# Patient Record
Sex: Female | Born: 1935 | Race: White | Hispanic: No | Smoking: Never smoker
Health system: Southern US, Community
[De-identification: ages and names within clinical notes are randomized; demographics above are authoritative.]

## PROBLEM LIST (undated history)

## (undated) ENCOUNTER — Inpatient Hospital Stay: Admission: AD | Payer: Medicare Other | Source: Other Acute Inpatient Hospital | Admitting: Internal Medicine

## (undated) DIAGNOSIS — I5042 Chronic combined systolic (congestive) and diastolic (congestive) heart failure: Secondary | ICD-10-CM

## (undated) DIAGNOSIS — F329 Major depressive disorder, single episode, unspecified: Secondary | ICD-10-CM

## (undated) DIAGNOSIS — F32A Depression, unspecified: Secondary | ICD-10-CM

## (undated) DIAGNOSIS — E785 Hyperlipidemia, unspecified: Secondary | ICD-10-CM

## (undated) DIAGNOSIS — I471 Supraventricular tachycardia, unspecified: Secondary | ICD-10-CM

## (undated) DIAGNOSIS — N3281 Overactive bladder: Secondary | ICD-10-CM

## (undated) DIAGNOSIS — E119 Type 2 diabetes mellitus without complications: Secondary | ICD-10-CM

## (undated) DIAGNOSIS — I447 Left bundle-branch block, unspecified: Secondary | ICD-10-CM

## (undated) DIAGNOSIS — I639 Cerebral infarction, unspecified: Secondary | ICD-10-CM

## (undated) DIAGNOSIS — I2699 Other pulmonary embolism without acute cor pulmonale: Secondary | ICD-10-CM

## (undated) DIAGNOSIS — I428 Other cardiomyopathies: Secondary | ICD-10-CM

## (undated) DIAGNOSIS — N184 Chronic kidney disease, stage 4 (severe): Secondary | ICD-10-CM

## (undated) DIAGNOSIS — I10 Essential (primary) hypertension: Secondary | ICD-10-CM

## (undated) DIAGNOSIS — M1990 Unspecified osteoarthritis, unspecified site: Secondary | ICD-10-CM

## (undated) DIAGNOSIS — I2510 Atherosclerotic heart disease of native coronary artery without angina pectoris: Secondary | ICD-10-CM

## (undated) DIAGNOSIS — I340 Nonrheumatic mitral (valve) insufficiency: Secondary | ICD-10-CM

## (undated) HISTORY — DX: Atherosclerotic heart disease of native coronary artery without angina pectoris: I25.10

## (undated) HISTORY — PX: TONSILLECTOMY: SUR1361

## (undated) HISTORY — DX: Unspecified osteoarthritis, unspecified site: M19.90

## (undated) HISTORY — DX: Overactive bladder: N32.81

## (undated) HISTORY — DX: Other cardiomyopathies: I42.8

## (undated) HISTORY — DX: Other pulmonary embolism without acute cor pulmonale: I26.99

## (undated) HISTORY — PX: BACK SURGERY: SHX140

## (undated) HISTORY — DX: Hyperlipidemia, unspecified: E78.5

---

## 1957-09-26 HISTORY — PX: CHOLECYSTECTOMY: SHX55

## 1957-09-26 HISTORY — PX: APPENDECTOMY: SHX54

## 2003-01-01 ENCOUNTER — Ambulatory Visit (HOSPITAL_COMMUNITY): Admission: RE | Admit: 2003-01-01 | Discharge: 2003-01-02 | Payer: Self-pay | Admitting: Cardiology

## 2004-10-28 ENCOUNTER — Ambulatory Visit: Payer: Self-pay | Admitting: Cardiology

## 2005-07-28 ENCOUNTER — Ambulatory Visit: Payer: Self-pay | Admitting: Cardiology

## 2005-08-10 ENCOUNTER — Ambulatory Visit: Payer: Self-pay | Admitting: Cardiology

## 2006-02-09 ENCOUNTER — Ambulatory Visit: Payer: Self-pay | Admitting: Cardiology

## 2006-09-26 HISTORY — PX: CATARACT EXTRACTION W/ INTRAOCULAR LENS  IMPLANT, BILATERAL: SHX1307

## 2006-10-27 ENCOUNTER — Ambulatory Visit: Payer: Self-pay | Admitting: Cardiology

## 2006-10-27 ENCOUNTER — Encounter (INDEPENDENT_AMBULATORY_CARE_PROVIDER_SITE_OTHER): Payer: Self-pay | Admitting: Internal Medicine

## 2006-11-13 ENCOUNTER — Ambulatory Visit: Payer: Self-pay | Admitting: Cardiology

## 2007-09-11 ENCOUNTER — Ambulatory Visit: Payer: Self-pay | Admitting: Internal Medicine

## 2007-09-11 DIAGNOSIS — N318 Other neuromuscular dysfunction of bladder: Secondary | ICD-10-CM

## 2007-09-11 DIAGNOSIS — H269 Unspecified cataract: Secondary | ICD-10-CM | POA: Insufficient documentation

## 2007-09-11 DIAGNOSIS — M129 Arthropathy, unspecified: Secondary | ICD-10-CM

## 2007-09-11 DIAGNOSIS — E785 Hyperlipidemia, unspecified: Secondary | ICD-10-CM

## 2007-09-11 DIAGNOSIS — I10 Essential (primary) hypertension: Secondary | ICD-10-CM

## 2007-09-15 ENCOUNTER — Encounter (INDEPENDENT_AMBULATORY_CARE_PROVIDER_SITE_OTHER): Payer: Self-pay | Admitting: Internal Medicine

## 2007-09-15 ENCOUNTER — Telehealth (INDEPENDENT_AMBULATORY_CARE_PROVIDER_SITE_OTHER): Payer: Self-pay | Admitting: Internal Medicine

## 2007-12-05 ENCOUNTER — Emergency Department (HOSPITAL_COMMUNITY): Admission: EM | Admit: 2007-12-05 | Discharge: 2007-12-05 | Payer: Self-pay | Admitting: Emergency Medicine

## 2007-12-12 ENCOUNTER — Ambulatory Visit: Payer: Self-pay | Admitting: Internal Medicine

## 2007-12-12 DIAGNOSIS — M546 Pain in thoracic spine: Secondary | ICD-10-CM

## 2007-12-12 DIAGNOSIS — I2510 Atherosclerotic heart disease of native coronary artery without angina pectoris: Secondary | ICD-10-CM | POA: Insufficient documentation

## 2007-12-13 ENCOUNTER — Ambulatory Visit (HOSPITAL_COMMUNITY): Admission: RE | Admit: 2007-12-13 | Discharge: 2007-12-13 | Payer: Self-pay | Admitting: Internal Medicine

## 2007-12-17 ENCOUNTER — Encounter (INDEPENDENT_AMBULATORY_CARE_PROVIDER_SITE_OTHER): Payer: Self-pay | Admitting: Internal Medicine

## 2007-12-17 ENCOUNTER — Telehealth (INDEPENDENT_AMBULATORY_CARE_PROVIDER_SITE_OTHER): Payer: Self-pay | Admitting: *Deleted

## 2007-12-30 ENCOUNTER — Observation Stay (HOSPITAL_COMMUNITY): Admission: EM | Admit: 2007-12-30 | Discharge: 2008-01-01 | Payer: Self-pay | Admitting: Emergency Medicine

## 2007-12-31 ENCOUNTER — Ambulatory Visit: Payer: Self-pay | Admitting: Gastroenterology

## 2008-01-14 ENCOUNTER — Encounter (INDEPENDENT_AMBULATORY_CARE_PROVIDER_SITE_OTHER): Payer: Self-pay | Admitting: Internal Medicine

## 2008-03-11 ENCOUNTER — Ambulatory Visit: Payer: Self-pay | Admitting: Cardiology

## 2008-12-26 ENCOUNTER — Encounter: Payer: Self-pay | Admitting: Cardiology

## 2009-01-12 ENCOUNTER — Ambulatory Visit: Payer: Self-pay | Admitting: Cardiology

## 2009-05-05 ENCOUNTER — Encounter (INDEPENDENT_AMBULATORY_CARE_PROVIDER_SITE_OTHER): Payer: Self-pay | Admitting: *Deleted

## 2009-05-12 ENCOUNTER — Telehealth: Payer: Self-pay | Admitting: Cardiology

## 2009-05-27 ENCOUNTER — Encounter: Payer: Self-pay | Admitting: Cardiology

## 2009-05-27 ENCOUNTER — Ambulatory Visit (HOSPITAL_COMMUNITY): Admission: RE | Admit: 2009-05-27 | Discharge: 2009-05-27 | Payer: Self-pay | Admitting: Orthopedic Surgery

## 2009-05-29 ENCOUNTER — Encounter: Payer: Self-pay | Admitting: Cardiology

## 2009-07-06 DIAGNOSIS — F341 Dysthymic disorder: Secondary | ICD-10-CM | POA: Insufficient documentation

## 2009-07-06 DIAGNOSIS — I447 Left bundle-branch block, unspecified: Secondary | ICD-10-CM

## 2009-07-06 DIAGNOSIS — I259 Chronic ischemic heart disease, unspecified: Secondary | ICD-10-CM | POA: Insufficient documentation

## 2009-08-06 ENCOUNTER — Encounter: Payer: Self-pay | Admitting: Cardiology

## 2009-08-10 ENCOUNTER — Encounter: Payer: Self-pay | Admitting: Cardiology

## 2009-08-14 ENCOUNTER — Encounter: Payer: Self-pay | Admitting: Cardiology

## 2009-08-22 ENCOUNTER — Encounter: Payer: Self-pay | Admitting: Cardiology

## 2009-10-01 ENCOUNTER — Ambulatory Visit: Payer: Self-pay | Admitting: Cardiology

## 2009-10-01 DIAGNOSIS — I2699 Other pulmonary embolism without acute cor pulmonale: Secondary | ICD-10-CM

## 2009-10-01 DIAGNOSIS — R262 Difficulty in walking, not elsewhere classified: Secondary | ICD-10-CM

## 2010-10-28 NOTE — Assessment & Plan Note (Signed)
Summary: 6 MO FU R/S FROM NO SHOW-SRS   Visit Type:  Follow-up Primary Provider:  Sherryll Burger  CC:  follow-up visit.  History of Present Illness: the patient is an 77 year old female with a history of nonischemic cardiomyopathy with normalized ejection fraction.  The patient was recently hospitalized for pulmonary embolism.  She has been started on Coumadin.  Of note also is that she has difficulty walking.  She is uses a walker and resided for a while at [facility]. She denies any chest pain.  She has no shortness of breath orthopnea PND or palpitations.  She reports no syncope.  She has history hypertension which is well-controlled.  She also has a chronic left bundle branch block.  Preventive Screening-Counseling & Management  Alcohol-Tobacco     Smoking Status: never  Current Problems (verified): 1)  Unspecified Chronic Ischemic Heart Disease  (ICD-414.9) 2)  Lbbb  (ICD-426.3) 3)  Dysthymic Disorder  (ICD-300.4) 4)  Back Pain, Thoracic Region, Chronic  (ICD-724.1) 5)  Coronary Artery Disease  (ICD-414.00) 6)  Arthritis  (ICD-716.90) 7)  Cataracts  (ICD-366.9) 8)  Overactive Bladder  (ICD-596.51) 9)  Hypertension  (ICD-401.9) 10)  Hyperlipidemia  (ICD-272.4)  Current Medications (verified): 1)  Meclizine Hcl 25 Mg  Tabs (Meclizine Hcl) .... As Needed 2)  Mavik 4 Mg  Tabs (Trandolapril) .... 1/2 By Mouth Once Daily 3)  Lipitor 10 Mg  Tabs (Atorvastatin Calcium) .Marland Kitchen.. 1 By Mouth Once Daily 4)  Zebeta 5 Mg  Tabs (Bisoprolol Fumarate) .Marland Kitchen.. 1 By Mouth Once Daily 5)  Imdur 30 Mg  Tb24 (Isosorbide Mononitrate) .... Take 1/2 Tab Once Daily 6)  Nitroglycerin 0.4 Mg Subl (Nitroglycerin) .... One Tablet Under Tongue Every 5 Minutes As Needed For Chest Pain---May Repeat Times Three 7)  Warfarin Sodium 5 Mg Tabs (Warfarin Sodium) .... Use As Directed 8)  Metformin Hcl 500 Mg Tabs (Metformin Hcl) .... Take 1 Tablet By Mouth Once A Day  Allergies (verified): No Known Drug  Allergies  Comments:  Nurse/Medical Assistant: The patient's medications and allergies were reviewed with the patient and were updated in the Medication and Allergy Lists. Patient brought list but not updated.  Past History:  Past Surgical History: Last updated: 12/12/2007 Cholecystectomy-1957 back surgery-1967, 1977 cardiac cath-01/01/03 Cataract--right eye-2008  Family History: Last updated: 02-Aug-2009 father-deceased-enlarged heart mother-deceased-CVA daughter-60 daughter-46  Social History: Last updated: 12/12/2007 Single lives alone Never Smoked Alcohol use-no Drug use-no  Risk Factors: Smoking Status: never (10/01/2009)  Past Medical History: Hyperlipidemia Hypertension overactive bladder cataracts arthritis nonischemic cardiomyopathy-EF 30% 2004, last echo 2/08 EF=60-65% Coronary artery disease-cath 2004--60% LAD--diffuse and calcified, 40% PDA status post pulmonary embolism 2010  Review of Systems  The patient denies fatigue, malaise, fever, weight gain/loss, vision loss, decreased hearing, hoarseness, chest pain, palpitations, shortness of breath, prolonged cough, wheezing, sleep apnea, coughing up blood, abdominal pain, blood in stool, nausea, vomiting, diarrhea, heartburn, incontinence, blood in urine, muscle weakness, joint pain, leg swelling, rash, skin lesions, headache, fainting, dizziness, depression, anxiety, enlarged lymph nodes, easy bruising or bleeding, and environmental allergies.         difficulty walking  Vital Signs:  Patient profile:   77 year old female Height:      62 inches Weight:      138 pounds Pulse rate:   55 / minute BP sitting:   131 / 70  (left arm) Cuff size:   regular  Vitals Entered By: Carlye Grippe (October 01, 2009 10:05 AM) CC: follow-up visit  Physical Exam  Additional Exam:  General: Well-developed, well-nourished in no distress head: Normocephalic and atraumatic eyes PERRLA/EOMI intact, conjunctiva and  lids normal nose: No deformity or lesions mouth normal dentition, normal posterior pharynx neck: Supple, no JVD.  No masses, thyromegaly or abnormal cervical nodes lungs: Normal breath sounds bilaterally without wheezing.  Normal percussion heart: regular rate and rhythm with normal S1 and S2, no S3 or S4.  PMI is normal.  No pathological murmurs abdomen: Normal bowel sounds, abdomen is soft and nontender without masses, organomegaly or hernias noted.  No hepatosplenomegaly musculoskeletal: Back normal, decreased strength in lower extremities with difficulty walking pulsus: Pulse is normal in all 4 extremities Extremities: No peripheral pitting edema neurologic: Alert and oriented x 3 skin: Intact without lesions or rashes cervical nodes: No significant adenopathy psychologic: Normal affect    Impression & Recommendations:  Problem # 1:  PE (ICD-415.19) the patient is status post a recent pulmonary embolism.  She is on Coumadin therapy.  She has no complications. The following medications were removed from the medication list:    Baby Aspirin 81 Mg Chew (Aspirin) .Marland KitchenMarland KitchenMarland KitchenMarland Kitchen 4 by mouth once daily Her updated medication list for this problem includes:    Warfarin Sodium 5 Mg Tabs (Warfarin sodium) ..... Use as directed  Problem # 2:  CARDIOMYOPATHY, DILATED (ICD-425.4) the patient has no significant heart failure symptoms.  Her ejection fraction is normalized.  Her blood pressures controlled. The following medications were removed from the medication list:    Sular 34 Mg Tb24 (Nisoldipine) .Marland Kitchen... 1 by mouth once daily    Baby Aspirin 81 Mg Chew (Aspirin) .Marland KitchenMarland KitchenMarland KitchenMarland Kitchen 4 by mouth once daily Her updated medication list for this problem includes:    Mavik 4 Mg Tabs (Trandolapril) .Marland Kitchen... 1/2 by mouth once daily    Zebeta 5 Mg Tabs (Bisoprolol fumarate) .Marland Kitchen... 1 by mouth once daily    Imdur 30 Mg Tb24 (Isosorbide mononitrate) .Marland Kitchen... Take 1/2 tab once daily    Nitroglycerin 0.4 Mg Subl (Nitroglycerin)  ..... One tablet under tongue every 5 minutes as needed for chest pain---may repeat times three    Warfarin Sodium 5 Mg Tabs (Warfarin sodium) ..... Use as directed  Problem # 3:  LBBB (ICD-426.3) chronic left bundle branch block. The following medications were removed from the medication list:    Sular 34 Mg Tb24 (Nisoldipine) .Marland Kitchen... 1 by mouth once daily    Baby Aspirin 81 Mg Chew (Aspirin) .Marland KitchenMarland KitchenMarland KitchenMarland Kitchen 4 by mouth once daily Her updated medication list for this problem includes:    Mavik 4 Mg Tabs (Trandolapril) .Marland Kitchen... 1/2 by mouth once daily    Zebeta 5 Mg Tabs (Bisoprolol fumarate) .Marland Kitchen... 1 by mouth once daily    Imdur 30 Mg Tb24 (Isosorbide mononitrate) .Marland Kitchen... Take 1/2 tab once daily    Nitroglycerin 0.4 Mg Subl (Nitroglycerin) ..... One tablet under tongue every 5 minutes as needed for chest pain---may repeat times three    Warfarin Sodium 5 Mg Tabs (Warfarin sodium) ..... Use as directed  Problem # 4:  WALKING DIFFICULTY (ICD-719.7) I asked the patient to discuss this with her primary care physician.  Particular to make sure that she does not have spinal stenosis.  Patient Instructions: 1)  Your physician recommends that you continue on your current medications as directed. Please refer to the Current Medication list given to you today. 2)  Follow up in  6 months.

## 2010-10-28 NOTE — Letter (Signed)
Summary: [facility]  [facility]   Imported By: Zachary George 10/01/2009 09:24:54  _____________________________________________________________________  External Attachment:    Type:   Image     Comment:   External Document

## 2010-10-28 NOTE — Letter (Signed)
Summary: Discharge Summary  Discharge Summary   Imported By: Zachary George 10/01/2009 09:30:01  _____________________________________________________________________  External Attachment:    Type:   Image     Comment:   External Document

## 2011-02-08 NOTE — Assessment & Plan Note (Signed)
[facility]                          [facility] OFFICE NOTE   NAME:[name] [name], [name] [name]                      MRN:          [id number]  DATE:03/11/2008                            DOB:          02-Dec-1923    HISTORY OF PRESENT ILLNESS:  The patient is an 77 year old female with a  history of nonischemic cardiomyopathy, now with normalized ejection  fraction.  The patient denies any substernal chest pain.  She has no  shortness of breath.  She had a recent admission to [facility]  for abdominal pain.  She denies any cardiac related symptoms including  palpitations or syncope.   MEDICATIONS:  1. Aspirin 81 mg p.o. daily.  2. Sular 34 mg p.o. daily.  3. Lipitor 10 mg p.o. daily.  4. Mavik 4 mg p.o. daily.   PHYSICAL EXAMINATION:  VITAL SIGNS:  Blood pressure is 126/64, heart  rate is 59, weight is 160 pounds.  NECK:  Normal carotid upstroke.  No carotid bruits.  LUNGS:  Clear breath sounds bilaterally.  HEART:  Regular rate and rhythm.  Normal S1 and S2.  No murmurs, rubs,  or gallops.  ABDOMEN:  Soft, nontender.  No rebound or guarding.  Good bowel sounds.  EXTREMITIES:  No cyanosis, clubbing, or edema.   PROBLEMS:  1. Nonischemic cardiomyopathy, ejection fraction normalized.  2. Chronic left bundle-branch block.  3. Hypertension, controlled.  4. Dizziness, resolved.  5. Nausea, resolved.   PLAN:  1. The patient is doing well from cardiovascular perspective.  I have      made no change in her medical regimen.  The patient can follow up      with [place] in 1 year.  2. Refill the patient's medications today in the office.     Learta Codding, MD,FACC  Electronically Signed    GED/MedQ  DD: 03/11/2008  DT: 03/12/2008  Job #: [id number]

## 2011-02-08 NOTE — Discharge Summary (Signed)
[name] [name], [name] [name]               ACCOUNT NO.:  [id number]   MEDICAL RECORD NO.:  [id number]          PATIENT TYPE:  OBV   LOCATION:  A302                          FACILITY:  [facility]   PHYSICIAN:  Dorris Singh, DO    DATE OF BIRTH:  June 29, 1924   DATE OF ADMISSION:  12/30/2007  DATE OF DISCHARGE:  04/07/2009LH                               DISCHARGE SUMMARY   ADMISSION DIAGNOSES:  1. Abdominal pain.  2. Nausea, vomiting.  3. Hypertension.  4. Mild leukocytosis.   DISCHARGE DIAGNOSES:  1. Urinary tract infection.  2. Abdominal pain which is resolved.  3. Hypertension.  4. Hypercholesterolemia.  5. History of coronary artery disease.   PRIMARY CARE PHYSICIAN:  Dr. Jen Mow.   CONSULTATIONS:  GI.   TESTS:  Tests that were done while she was here is CT of the abdomen and  pelvis without contrast.  CT showed no acute abnormality of the abdomen,  diffuse colonic diverticulosis, bilateral adrenal gland calcifications  likely due to adrenal hemorrhages, atrophic left kidney,  nonvisualization of the gallbladder.  There is no history of  cholecystectomy, severe contracted gallbladder versus congenital  atresia.  Her pelvis showed 3 x 5 right ovarian cyst, extensive sigmoid  diverticulosis without evidence of diverticulitis.   H&P was done by Dr. Dorris Singh.  Please refer to that.  The patient  is an 77 year old woman who was seen in the emergency room with  complaints of acute abdominal pain.  She was then admitted to the  service of [facility] for nausea and vomiting as well.  She was given IV  hydration.  A CT of her abdomen was obtained, and GI was consulted.  She  was put on DVT and GI prophylaxis, and her home medications were  continued.  While she was here it was found the patient had a UTI.  She  was started on Levaquin.  She continued to progress without any  problems.  Dr. Cira Servant saw her and recommended she follow up with her six  weeks in our office.  On the 7th it was  determined that patient was  stable enough to go home.  Her labs were within normal limits, and the  patient also was very upset because she does have a dog that is at home  that has seizures and has been home alone since she has been here and is  anxious to go home.   DISCHARGE MEDICATIONS:  1. Meclizine 25 mg three times a day.  2. Sular 34 mg once a day.  3. Trandolapril 4 mg daily.  4. Diisopropyl fumarate 5 mg p.o. daily.  5. Lipitor 10 mg p.o. daily.  6. She will also be sent home on Levaquin 500 mg p.o. x5 more days,      Protonix 40 mg once a day.   DISCHARGE INSTRUCTIONS:  Her discharge instructions are for her to  increase activity slowly, start on a bland diet, and to see Dr. Jen Mow  within 3-5 days.  She is diagnosed with a urinary tract infection.  She  is to finish the course of Levaquin 500  mg p.o. daily x5 days for  abdominal pain.  She is to use Protonix 40 mg once a day and follow up  with GI in about six weeks as recommended.  Her bland diet she is to  increase as tolerated, and she is to see Dr. Kristian Covey regarding an  abnormal kidney seen on CT.  The abnormality was mentioned above.  Would  like her to have some kind of follow up to see if this is anything that  needs to be investigated further for atrophic kidney.  The patient is to  return if symptoms worsen.   CONDITION ON DISCHARGE:  Her condition today is stable.   DISPOSITION:  Her disposition will be to home.   LABORATORY DATA:  White count has decreased to 7.6 from 10.8, and her  hemoglobin has remained stable at 10.8.  BMET today:  Potassium 140,  sodium 4.4.  Everything is within normal limits.      Dorris Singh, DO  Electronically Signed     CB/MEDQ  D:  01/01/2008  T:  01/01/2008  Job:  [id number]

## 2011-02-08 NOTE — Consult Note (Signed)
NAME:  [name] [name], [name] [name]               ACCOUNT NO.:  [id number]   MEDICAL RECORD NO.:  [id number]          PATIENT TYPE:  OBV   LOCATION:  A302                          FACILITY:  [facility]   PHYSICIAN:  Kassie Mends, M.D.      DATE OF BIRTH:  [birth year]   DATE OF CONSULTATION:  DATE OF DISCHARGE:                                 CONSULTATION   PRIMARY PHYSICIAN:  Dr. Jen Mow.   REFERRING PHYSICIAN:  Dr. Dorris Singh.   REASON FOR CONSULTATION:  Abdominal pain.   HISTORY OF PRESENT ILLNESS:  [name] [name] is an 77 year old female who  presents with chronic upper back pain. She has had x-rays from Dr. Jen Mow.  When initially asked if she has abdominal pain she denied it. Further  along in the in the history, she stated she came to the emergency  department for pain in her stomach.  She describes it as all over.  She  has had pain in her abdomen for a year.  The pain got worse yesterday.  It was sharp and started around 11:00 a.m. It persisted for an hour and  a half so she came to the emergency department.  They gave her pain  medicine and she feels better.  She had moderate nausea and then vomited  five to six times at home.  She had no blood in her vomit.  She denies  any heartburn or indigestion.  She states she was on four aspirin a day  and now she is only on two. She denies any use of Aleve, ibuprofen,  Motrin, BC or Goody powders.  She usually has one bowel movement a day.  Since a admission, she is tolerating p.o.'s and she has not vomited.   PAST MEDICAL HISTORY:  1. Hypertension.  2. Hyperlipidemia.  3. Vertigo.  4. Nonischemic cardiomyopathy with an ejection fraction of 30%.  5. Left bundle branch block.   PAST SURGICAL HISTORY:  1. Appendectomy.  2. Back surgery.  3. Cholecystectomy.   ALLERGIES:  She has no known drug allergies.   MEDICATIONS:  1. Bisoprolol.  2. Lovenox.  3. Sular  4. Protonix.  5. Senokot.  6. Zocor.  7. Mavik.   SOCIAL HISTORY:  She lives alone  and is estranged from her children.  She has a neighbor that helps take care of her and visits her. She does  not use tobacco products or alcohol.   REVIEW OF SYSTEMS:  Per the HPI otherwise all systems are negative.   PHYSICAL EXAM:  Tmax 98.4, systolic blood pressure 154/125.  GENERAL:  She is in no apparent distress, alert and oriented x4.  HEENT:  Atraumatic, normocephalic.  Pupils equal and reactive to light.  Mouth  no oral lesions.  NECK:  Full range of motion.  No lymphadenopathy.  LUNGS:  Clear to auscultation bilaterally.  CARDIOVASCULAR:  Regular  rhythm, no murmur.  ABDOMEN:  Bowel sounds are present, soft, obese,  nontender, nondistended.  No rebound or guarding.  EXTREMITIES:  No  cyanosis or edema.  NEURO:  She has no focal neurologic deficits.   LABS:  White count is 10.8 to 7.6.  Hemoglobin 11.9 to 10.8.  Platelets  153, INR 1.0. Potassium 4.4, creatinine 1.09, normal hepatic function  panel, lipase 18, amylase 46.  UA positive nitrate and moderate  leukocyte, few epi's, 21-50 WBCs and many bacteria.   RADIOGRAPHIC STUDIES:  CT scan of the abdomen and pelvis without  contrast shows extensive sigmoid diverticulosis without evidence of  diverticulitis and no acute intra-abdominal pathology.   ASSESSMENT:  [name] [name] is an 77 year old female who appears to have  abdominal pain, nausea, and vomiting secondary to urinary tract  infection.  Her chronic abdominal complaints are vague.  Thank you for  allowing me to see [name] [name] in consultation.  My recommendations  follow.   RECOMMENDATIONS:  1. She may follow up in our office in 4-6 weeks to address her chronic      abdominal pain after her urinary tract infection has been treated.  2. Continue Protonix for GI prophylaxis.  3. Will discuss the benefits versus the risks of colonoscopy as an      outpatient.  4. The drop in her hemoglobin from 11.9 to 10.8 is likely secondary to      hemodilution. She does have mild  renal insufficiency and may have      anemia of chronic disease.  She should have an iron panel checked      today.   ADDENDUM:  OPV in 4-6 weeks for abdominal pain.      Kassie Mends, M.D.  Electronically Signed     SM/MEDQ  D:  12/31/2007  T:  12/31/2007  Job:  [id number]   cc:   Erle Crocker, M.D.

## 2011-02-08 NOTE — Assessment & Plan Note (Signed)
[facility]                          [facility]ICE NOTE   NAME:[name] [name], [name] [name]                      MRN:          [id number]  DATE:01/12/2009                            DOB:          1924/06/23    REFERRING PHYSICIAN:  Kirstie Peri, MD   HISTORY OF PRESENT ILLNESS:  The patient is an 77 year old female with  history of nonischemic cardiomyopathy with normalized ejection fraction.  The patient states that on occasion she has epigastric pain which  radiates up into the chest and shoulder blade.  Sometimes, she has pain  in the shoulder blade that radiates downwards.  She did have a  catheterization in 2004 which showed normal coronary arteries.  She  asked Dr. Sherryll Burger about some gastrointestinal pathology, but reportedly the  workup has been negative.  She also was recently admitted for urinary  tract infection.  She is doing much better now.  The patient denies any  orthopnea, PND, palpitations, or syncope.   MEDICATIONS:  1. Aspirin 81 mg a day.  2. Sular 34 mg p.o. daily.  3. Lipitor 10 mg p.o. daily.  4. Trandolapril 400 mg p.o. daily.  5. Bisoprolol fumarate 5 mg p.o. daily.   PHYSICAL EXAMINATION:  VITAL SIGNS:  Blood pressure 120/68, heart rate  62, weight 59 pounds, respirations 18.  GENERAL:  Well-nourished white female in no apparent distress.  HEENT:  Pupils; eyes are equal.  Conjunctivae clear.  NECK:  Supple.  Normal carotid upstrokes.  No carotid bruits.  LUNGS:  Clear breath sounds bilaterally.  HEART:  Regular rate and rhythm.  Normal S1 and S2.  No murmurs, rubs,  or gallops.  There is a paradoxical split of S2.  ABDOMEN:  Soft,  nontender.  EXTREMITIES:  No cyanosis, clubbing, or edema.   PROBLEM LIST:  1. Nonischemic cardiomyopathy, normalized ejection fraction.  2. Chronic left bundle-branch block.  3. Hypertension, controlled.  4. Shoulder pain with chest pain rule out angina equivalent.  5. History of nausea workup  per Dr. Sherryll Burger.   PLAN:  1. The patient's shoulder pain could represent angina equivalent and I      have given her p.r.n. nitroglycerin.  I have asked her to follow up      with me and let me know if she has more frequent episodes that      respond to nitroglycerin.  2. I doubt the patient has ischemic heart disease given her negative      cardiac catheterization if ongoing symptoms.  Certainly, a stress      test is indicated.     Learta Codding, MD,FACC  Electronically Signed    GED/MedQ  DD: 01/12/2009  DT: 01/13/2009  Job #: [id number]   cc:   Kirstie Peri, MD

## 2011-02-08 NOTE — H&P (Signed)
[name] [name], [name] [name] NO.:  [id number]   MEDICAL RECORD NO.:  [id number]          PATIENT TYPE:  EMS   LOCATION:  ED                            FACILITY:  [facility]   PHYSICIAN:  Dorris Singh, DO    DATE OF BIRTH:  06/28/1924   DATE OF ADMISSION:  12/30/2007  DATE OF DISCHARGE:  LH                              HISTORY & PHYSICAL   PRIMARY CARE PHYSICIAN:  Dr. Jen Mow.   CHIEF COMPLAINT:  Abdominal pain.   The patient is an 77 year old woman who presented to the [facility]  emergency room with a chief complaint of abdominal pain.  She stated  that it started about 4 hours and the onset has been acute, and it has  been constant without any relief.  It is located in the epigastric  region and it radiates to the back, and it is characterized as sharp in  nature.  Right now, the patient was rating the pain as a 10/10, also  states that it has been associated with nausea and vomiting.  Does not  admit to any hematemesis or hematuria or melena.  She states that she  has had a ongoing history for over a year of this type of pain without  any relief.   PAST MEDICAL HISTORY:  Significant for hypertension,  hypercholesterolemia, vertigo and coronary artery disease.   SURGICAL HISTORY:  She had had an appendectomy and back surgery and a  cholecystectomy.   SOCIAL HISTORY:  She does not smoke or drink or use any drugs; however,  she lives alone and does not have any family in the area.  She has a  neighbor that helps to take care of her and comes to visit her on a  daily basis.   ALLERGIES:  SHE HAS NO KNOWN DRUG ALLERGIES.   CURRENT MEDICATIONS:  She is on:  1. Meclizine 25 mg as needed.  2. Mavik oral, 4 mg.  3. Lipitor oral, no dose given.  4. Sular once a day, 34 mg.  5. Zebeta 4 times a day, no dose given.  6. Aspirin 81 mg once a day.  7. Bisoprolol fumarate 5 mg once a day.   REVIEW OF SYSTEMS:  CONSTITUTIONAL:  Negative for weight loss or fever  but positive  for weakness.  HEENT:  Eyes negative for changes in vision.  EARS, NOSE AND THROAT:  Negative for hearing loss, changes in smell or  changes in speech or sore throat.  CARDIOVASCULAR:  Negative for chest  pain or palpitations.  RESPIRATORY:  Negative for dyspnea or wheezing.  GASTROINTESTINAL:  Positive for nausea, vomiting, and abdominal pain.  Negative for diarrhea and constipation.  GU:  Negative for dysuria or  hesitancy.  MUSCULOSKELETAL:  Negative for arthralgias or myalgias.  SKIN:  Negative for rashes or ecchymosis.  NEURO:  Negative for dizziness or altered consciousness.   PHYSICAL EXAMINATION:  VITAL SIGNS:  Her blood pressure is 130/58, pulse  rate 76, respirations 16, temperature 97.2, O2 sat at 98%.  GENERAL:  The patient is an 77 year old Caucasian female who is well-  developed, well-nourished, in no acute distress.  She answers questions  appropriately and is upbeat.  HEENT:  Head is normocephalic, atraumatic.  Eyes are EOMI.  No  conjunctivae discharge or scleral icterus.  NECK:  Supple, full range of motion.  No lymphadenopathy noted.  HEART:  Regular rate and rhythm.  No murmurs noted.  RESPIRATORY:  Clear to auscultation bilaterally.  No rales, wheezes or  rhonchi.  ABDOMEN:  Soft, nontender, nondistended.  No guarding or rebound  tenderness.  No organomegaly noted.  EXTREMITIES:  No abnormalities, no deformity noted, and no ecchymosis or  cyanosis or edema.  NEURO:  Alert and oriented x3, cranial nerves 2-12 grossly intact.  Good  strength in all extremities.  SKIN:  Normal, cool and dry.   Her EKG that was done:  Her rate was 69 beats per minute, left bundle  branch block, nonspecific ST and T-wave changes.  There is no  comparison.  Her first set of cardiac enzymes are negative. Also, her  CBC:  White count 10.8, hemoglobin 11.9, hematocrit 34.6, platelet count  189.  Her lipase is within normal limits, and her amylase is also within  normal limits.  Her  chemistry:  Sodium 137, potassium 5.3, chloride 108,  carbon dioxide 24 and glucose 164, BUN 20 and creatinine 1.24.  Her  other enzymes are also within normal limits.   ASSESSMENT/PLAN:  1. Abdominal pain.  2. Nausea and vomiting.  3. Hypertension.  4. Mild leukocytosis.   I will admit to the service of [facility], until observation to general  med floor.  Will advance her diet from clear liquids and increase as  tolerated.  Will get a GI consult in the morning for abdominal pain.  Will also put her on IV hydration at 110 mL per hour.  Currently, the  patient is pending a CT of the abdomen with contrast, await the results  there.  Also, will continue to monitor any blood work and make changes  as necessary.  The patient will be placed on prophylaxis for DVT, as  well as GI.  Will put her on her medication doses at home and will  continue to follow her and make any changes as necessary.      Dorris Singh, DO  Electronically Signed     CB/MEDQ  D:  12/30/2007  T:  12/30/2007  Job:  [id number]

## 2011-02-11 NOTE — Assessment & Plan Note (Signed)
[facility]                          [facility] OFFICE NOTE   NAME:[name] [name] PETTERSON                      MRN:          [id number]  DATE:10/27/2006                            DOB:          [birth year]    HISTORY OF PRESENT ILLNESS:  The patient is a 77 year old female with a  history of a nonischemic cardiomyopathy, ejection fraction 30%.  The  patient presents for routine followup.  She states otherwise she has  been doing well.  She has no chest pain, no shortness of breath.  She  does report some dizziness on occasion, but it appears to occur after  she takes her Sular.  She also reports no orthopnea, PND, no  palpitations, or syncope.   MEDICATIONS:  1. Mavik 4 mg half tablet p.o. daily.  2. Sular 30 mg p.o. daily.  3. Zebeta 5 mg p.o. daily.  4. Aspirin 81 mg p.o. daily.  5. Lipitor nightly.   EXAMINATION:  VITAL SIGNS:  Blood pressure 140/58, heart rate 59, weight  166 pounds.  NECK:  Normal carotid upstroke, no carotid bruits.  LUNGS:  Clear, breath sounds bilaterally.  HEART:  Regular rate and rhythm, regular S1, S2, no murmur, rubs, or  gallops.  ABDOMEN:  Soft.  EXTREMITY:  No cyanosis, clubbing, or edema.   PROBLEMS:  1. Ischemic cardiomyopathy, ejection fraction 30%  2. Chronic left bundle branch block.  3. Hypertension, controlled.  4. Dizziness possibly related to vasodilator drugs.  5. Nausea, resolved.   PLAN:  1. The patient has a non ischemic cardiomyopathy and we will check an      echocardiogram to revaluate her ejection fraction.  2. I have asked her to take Sular in the evening as she may have some      vasodilator side effects, particularly after she eats a meal when      she takes [employer].  3. The patient will follow up with [place] in 6 months.     Learta Codding, MD,FACC  Electronically Signed    GED/MedQ  DD: 10/27/2006  DT: 10/27/2006  Job #: [id number]

## 2011-02-11 NOTE — Cardiovascular Report (Signed)
NAME:  [name] [name], [name] [name]                         ACCOUNT NO.:  [id number]   MEDICAL RECORD NO.:  [id number]                   PATIENT TYPE:  OIB   LOCATION:  2858                                 FACILITY:  [facility]   PHYSICIAN:  Learta Codding, M.D. [facility]             DATE OF BIRTH:  10/09/1923   DATE OF PROCEDURE:  01/01/2003  DATE OF DISCHARGE:                              CARDIAC CATHETERIZATION   REFERRING PHYSICIAN:  Dr. Winona Legato in [place].   CARDIOLOGIST:  Dr. Learta Codding.   PROCEDURES PERFORMED:  1. Left heart catheterization with selective coronary angiography.  2. Ventriculography.   DIAGNOSES:  1. Single-vessel coronary artery disease.  2. Left ventricular systolic dysfunction.   INDICATIONS:  The patient is a 77 year old female with a history of  shortness of breath and left bundle branch block.  The patient had a prior  Cardiolite study which demonstrates an ejection fraction of 30% with  multiple fixed perfusion defects, both in the anterior wall and inferior  wall.  The patient has been referred for a diagnostic catheterization to  assess her coronary anatomy, particularly to rule out ischemic  cardiomyopathy.   DESCRIPTION OF PROCEDURE:  After informed consent was obtained, the patient  was brought to the catheterization lab.  The right groin was sterilely  prepped and draped.  Lidocaine 1% was injected.  A 6-French arterial sheath  was placed using a modified Seldinger technique.  The 6-French JL4 and JR4  catheters were respectively used to engage the left and right coronary  ostia.  Coronary angiography was performed in various projections using  manual injection of contrast.  A 6-French angled pigtail catheter was used  for ventriculography using power injections.  At the termination of the  procedure, all catheters and sheath were removed and the patient was brought  back to the holding area.  No complications were encountered and adequate  hemostasis was  provided.   FINDINGS:   HEMODYNAMICS:  Left ventricular pressure 170/8 mmHg.  Arterial pressure  170/68 mmHg.  There was no gradient on aortic pullback.   VENTRICULOGRAPHY:  Ejection fraction was 30-40%, but there was significant  ventricular ectopy and the ejection fraction was estimated on a post PVC  beat; there was also 1 to 2+ mitral regurgitation.   SELECTIVE CORONARY ANGIOGRAPHY:  1. The left main coronary artery was short and there were near separate     ostia of the LAD and circumflex coronary artery.  2. The left anterior descending artery is a moderate-sized vessel.  There     was a diffuse and calcified 60% stenosis following the takeoff of the     second diagonal branch.  The remainder of the LAD was free of flow-     limiting lesions.  The first and second diagonal branches were also free     of flow-limiting lesions.  3. Circumflex coronary artery was a large-caliber vessel  providing a large     first and second obtuse marginal which were free of flow-limiting     disease.  4. The right coronary artery again was a large-caliber vessel and the     circulation was right-dominant.  5. The posterior descending artery had a diffuse tight 40% stenosis but the     remainder of the right circulation was free of flow-limiting lesions.   IMPRESSION AND RECOMMENDATION:  The patient appears to have predominantly a  nonischemic cardiomyopathy.  The coronary artery disease is out of  proportion to the degree of her left ventricular dysfunction.   Cardinal symptoms are shortness of breath.  She will need aggressive medical  treatment of her nonischemic cardiomyopathy and would include ACE inhibitor  and beta blocker therapy.  Unless the patient has recurrent substernal chest  pain which is typical for angina, one could consider intervention to the  left anterior descending, but it is my feeling that her symptoms of chest  pain are rather atypical.  Furthermore, the patient was not  on an aggressive  medical regimen on initial evaluation.  I will follow the patient closely in  [place] and will maximize her secondary prevention therapy with higher-dose  statin therapy.                                               Learta Codding, M.D. [facility]    GED/MEDQ  D:  01/01/2003  T:  01/02/2003  Job:  [id number]   cc:   Zenovia Jordan M.D.   [facility]

## 2011-02-11 NOTE — Discharge Summary (Signed)
NAME:  [name] [name], [name] [name]                         ACCOUNT NO.:  [id number]   MEDICAL RECORD NO.:  [id number]                   PATIENT TYPE:  OIB   LOCATION:  3714                                 FACILITY:  [facility]   PHYSICIAN:  Learta Codding, M.D. [facility]             DATE OF BIRTH:  30-Dec-1923   DATE OF ADMISSION:  01/01/2003  DATE OF DISCHARGE:  01/02/2003                           DISCHARGE SUMMARY - REFERRING   PROCEDURE:  1. Cardiac catheterization.  2. Coronary arteriogram.  3. Left ventriculogram.   HOSPITAL COURSE:  [name] [name] is a 77 year old female with a history of  shortness of breath and left bundle branch block but no known history of  coronary artery disease who was evaluated by Dr. Andee Lineman. She had a  Cardiolite as part of the evaluation which showed an EF of 30% with multiple  fixed perfusion defects. Cardiac catheterization at that time was  recommended, but she refused. On 12/18/02, she was followed in the office and  agreed to the catheterization. She came in for this on 01/01/03.   The cardiac catheterization showed a normal left main and a LAD with  approximately 60% diffuse calcified stenosis after the second diagonal. It  was a moderate to large vessel. The circumflex had no significant coronary  artery disease, and the RCA showed no significant obstruction, but there was  a 30 to 40% stenosis in the PLA. Her EF was 30 to 40% after PVCs with 1 to  2+ MR. The films were evaluated by Dr. Andee Lineman and Dr. Samule Ohm. It was felt  that medical therapy was the best option for her with improvement in her  blood pressure control. Imdur was added to her medication regimen as well.  It was felt that she had predominantly nonischemic cardiomyopathy with  symptoms primarily shortness of breath that improved on medical therapy. She  was evaluated by Dr. Myrtis Ser on 01/02/03, and her groin was stable. Dr. Andee Lineman  had outlined the plan and the medication changes. She was considered stable  for  discharge on 01/02/03.   DISCHARGE CONDITION:  Stable.   DISCHARGE DIAGNOSES:  1. Dyspnea on exertion, medical therapy recommended.  2. Status post cardiac catheterization this admission with a 60% calcified     LAD and a 30 to 40% PLA, medical therapy recommended.  3. Left ventricular dysfunction with primarily nonischemic cardiomyopathy     and an EF of 30 to 40% with 1 to 2+ MR by catheterization this admission.  4. Left bundle branch block.  5. Abnormal Cardiolite with multiple fixed perfusion defects and an ejection     fraction of 30%.  6. Hypertension.  7. Hyperlipidemia.  8. History of two back surgeries and a cholecystectomy.  9. History of osteoarthritis.   DISCHARGE INSTRUCTIONS:  1. Her activity level was to include no driving, sexual or strenuous     activities for two days.  2. She is to  call the office for problems with the catheterization site.  3. She is to follow up with Dr. Andee Lineman in [place] on 4/14 at 10 a.m. She is to     follow up with Dr. Raul Del as needed.   DISCHARGE MEDICATIONS:  1. Zebeta 5 mg q.d.  2. Sular 20 mg q.d.  3. Mavik 4 mg one half tablet q.d.  4. Coated aspirin 325 mg q.d.  5. Isordil 20 mg b.i.d.   Of note, Imdur was added to her medication regimen, but the patient had been  on Isordil at 20 mg b.i.d. prior to admission. She is to continue on this  until she sees him in the office, and then he can advise if any changes are  to be made.     Lavella Hammock, P.A. [facility]                  Learta Codding, M.D. [facility]    RG/MEDQ  D:  01/02/2003  T:  01/03/2003  Job:  [id number]   cc:   Nena Jordan

## 2011-06-21 LAB — COMPREHENSIVE METABOLIC PANEL
Albumin: 3.8
BUN: 15
BUN: 20
CO2: 23
Calcium: 9
Calcium: 9.7
Creatinine, Ser: 1.09
Creatinine, Ser: 1.29 — ABNORMAL HIGH
GFR calc Af Amer: 58 — ABNORMAL LOW
GFR calc non Af Amer: 48 — ABNORMAL LOW
Glucose, Bld: 106 — ABNORMAL HIGH
Total Bilirubin: 0.6
Total Protein: 6.4

## 2011-06-21 LAB — DIFFERENTIAL
Basophils Absolute: 0
Basophils Absolute: 0.1
Lymphocytes Relative: 13
Lymphocytes Relative: 37
Lymphs Abs: 2.8
Monocytes Absolute: 0.5
Monocytes Relative: 5
Neutro Abs: 8.7 — ABNORMAL HIGH
Neutrophils Relative %: 55

## 2011-06-21 LAB — URINE MICROSCOPIC-ADD ON

## 2011-06-21 LAB — APTT: aPTT: 36

## 2011-06-21 LAB — CARDIAC PANEL(CRET KIN+CKTOT+MB+TROPI)
Relative Index: INVALID
Total CK: 50
Total CK: 51
Troponin I: 0.03

## 2011-06-21 LAB — URINALYSIS, ROUTINE W REFLEX MICROSCOPIC
Hgb urine dipstick: NEGATIVE
Protein, ur: NEGATIVE
Urobilinogen, UA: 0.2

## 2011-06-21 LAB — CBC
HCT: 31.2 — ABNORMAL LOW
HCT: 34.6 — ABNORMAL LOW
Hemoglobin: 10.8 — ABNORMAL LOW
MCHC: 34.4
MCHC: 34.6
MCV: 83.6
MCV: 84.1
Platelets: 189
RBC: 3.73 — ABNORMAL LOW
RDW: 14.2

## 2011-06-21 LAB — POCT CARDIAC MARKERS
CKMB, poc: 1.2
Myoglobin, poc: 132

## 2011-06-21 LAB — MAGNESIUM: Magnesium: 2

## 2011-06-21 LAB — PHOSPHORUS: Phosphorus: 2.9

## 2011-06-21 LAB — PROTIME-INR: INR: 1

## 2011-06-21 LAB — FERRITIN: Ferritin: 15 (ref 10–291)

## 2012-02-08 ENCOUNTER — Inpatient Hospital Stay (HOSPITAL_COMMUNITY)
Admission: EM | Admit: 2012-02-08 | Discharge: 2012-02-09 | DRG: 313 | Disposition: A | Discharge status: Home / Self Care | Payer: Medicare Other | Attending: Cardiology | Admitting: Cardiology

## 2012-02-08 ENCOUNTER — Emergency Department (HOSPITAL_COMMUNITY): Payer: Medicare Other

## 2012-02-08 DIAGNOSIS — I259 Chronic ischemic heart disease, unspecified: Secondary | ICD-10-CM

## 2012-02-08 DIAGNOSIS — R079 Chest pain, unspecified: Secondary | ICD-10-CM

## 2012-02-08 DIAGNOSIS — I10 Essential (primary) hypertension: Secondary | ICD-10-CM | POA: Diagnosis present

## 2012-02-08 DIAGNOSIS — E119 Type 2 diabetes mellitus without complications: Secondary | ICD-10-CM

## 2012-02-08 DIAGNOSIS — E785 Hyperlipidemia, unspecified: Secondary | ICD-10-CM

## 2012-02-08 DIAGNOSIS — I2510 Atherosclerotic heart disease of native coronary artery without angina pectoris: Secondary | ICD-10-CM

## 2012-02-08 DIAGNOSIS — Z86718 Personal history of other venous thrombosis and embolism: Secondary | ICD-10-CM

## 2012-02-08 DIAGNOSIS — I428 Other cardiomyopathies: Secondary | ICD-10-CM | POA: Diagnosis present

## 2012-02-08 HISTORY — DX: Essential (primary) hypertension: I10

## 2012-02-08 HISTORY — DX: Type 2 diabetes mellitus without complications: E11.9

## 2012-02-08 HISTORY — DX: Depression, unspecified: F32.A

## 2012-02-08 HISTORY — DX: Major depressive disorder, single episode, unspecified: F32.9

## 2012-02-08 LAB — CBC
Hemoglobin: 9.7 g/dL — ABNORMAL LOW (ref 12.0–15.0)
MCV: 86.3 fL (ref 78.0–100.0)
Platelets: 248 10*3/uL (ref 150–400)
RBC: 3.51 MIL/uL — ABNORMAL LOW (ref 3.87–5.11)
WBC: 7.3 10*3/uL (ref 4.0–10.5)

## 2012-02-08 LAB — POCT I-STAT TROPONIN I

## 2012-02-08 LAB — POCT I-STAT, CHEM 8
BUN: 21 mg/dL (ref 6–23)
Creatinine, Ser: 1.4 mg/dL — ABNORMAL HIGH (ref 0.50–1.10)
Potassium: 5.1 mEq/L (ref 3.5–5.1)
Sodium: 142 mEq/L (ref 135–145)
TCO2: 23 mmol/L (ref 0–100)

## 2012-02-08 LAB — GLUCOSE, CAPILLARY: Glucose-Capillary: 93 mg/dL (ref 70–99)

## 2012-02-08 LAB — DIFFERENTIAL
Eosinophils Relative: 1 % (ref 0–5)
Lymphocytes Relative: 26 % (ref 12–46)
Lymphs Abs: 1.9 10*3/uL (ref 0.7–4.0)

## 2012-02-08 NOTE — ED Notes (Signed)
Patient remains on monitor and sats of 97% on RA. Patient denies any chest pain and states she feels her sugar could be low.

## 2012-02-08 NOTE — ED Notes (Signed)
Patient remains on monitor and sats of 96% RA. Patient resting with NAD at this time waiting heart healthy dinner tray.

## 2012-02-08 NOTE — ED Notes (Signed)
Placed called for Cardiac tray

## 2012-02-08 NOTE — ED Notes (Signed)
Patient states she was sitting in a chair and started to have chest pain and bilateral arm pain. Patient friend called EMS and patient transported to [facility]. Patient denies chest pain or SOB, N/V/D/F. Patient states she had a catherization x  7 years ago, unremarkable and no other hx. Patient placed on monitor and 2L oxygen with sats of 99%.

## 2012-02-08 NOTE — ED Provider Notes (Signed)
History     CSN: [id number]  Arrival date & time 02/08/12  1456   First MD Initiated Contact with Patient 02/08/12 1512      Chief Complaint  Patient presents with  . Chest Pain   PCP Sherryll Burger in [place] [facility] [place]  (Consider location/radiation/quality/duration/timing/severity/associated sxs/prior treatment) HPI This 78 year old female is brought by EMS for chest pain syndrome which is now resolved. Patient has history of coronary artery disease as well as normal ejection fraction with nonischemic cardiomyopathy, she has had a pulmonary embolism in the past as well and is no longer taking Coumadin for that, over the last couple of months she is felt fine using a walker at baseline and lives alone in an apartment. Today she had a gradual onset gradual worsening then gradual resolution and is now completely pain free of any chest pain syndrome.  Her discomfort was a dull vague ache and radiates to both upper arms to the elbow. She had no associated symptoms such as dizziness shortness of breath nausea. She is no back pain or abdominal pain. She is totally symptomatically now. Her episode occurred at rest. She resolution of her symptoms when she received aspirin and nitroglycerin from EMS prior to arrival. Her total spell lasted about half an hour. Past Medical History  Diagnosis Date  . Hypertension   . High cholesterol   . Type II diabetes mellitus   . Depression    Pulmonary embolism, nonischemic cardiomyopathy, coronary artery disease 1) Unspecified Chronic Ischemic Heart Disease (ICD-414.9)  2) Lbbb (ICD-426.3)  3) Dysthymic Disorder (ICD-300.4)  4) Back Pain, Thoracic Region, Chronic (ICD-724.1)  5) Coronary Artery Disease (ICD-414.00)  6) Arthritis (ICD-716.90)  7) Cataracts (ICD-366.9)  8) Overactive Bladder (ICD-596.51)  9) Hypertension (ICD-401.9)  10) Hyperlipidemia (ICD-272.4)  Past Surgical History  Procedure Date  . Cholecystectomy 1959  . Back surgery 1967;  1977  . Appendectomy 1959  . Tonsillectomy   . Cataract extraction w/ intraocular lens  implant, bilateral    Cholecystectomy-1957  back surgery-1967, 1977  cardiac cath-01/01/03  Cataract--right eye-2008  History reviewed. No pertinent family history.  History  Substance Use Topics  . Smoking status: Never Smoker   . Smokeless tobacco: Never Used  . Alcohol Use: Yes     02/09/12 "beer every once in awhile w/pizza"  Single lives alone  Never Smoked  Alcohol use-no  Drug use-no   OB History    Grav Para Term Preterm Abortions TAB SAB Ect Mult Living                  Review of Systems  Constitutional: Negative for fever.       10 Systems reviewed and are negative for acute change except as noted in the HPI.  HENT: Negative for congestion.   Eyes: Negative for discharge and redness.  Respiratory: Negative for cough and shortness of breath.   Cardiovascular: Positive for chest pain. Negative for palpitations and leg swelling.  Gastrointestinal: Negative for vomiting and abdominal pain.  Musculoskeletal: Negative for back pain.  Skin: Negative for rash.  Neurological: Negative for syncope, numbness and headaches.  Psychiatric/Behavioral:       No behavior change.    Allergies  Review of patient's allergies indicates no known allergies.  Home Medications   Current Outpatient Rx  Name Route Sig Dispense Refill  . ASPIRIN EC 81 MG PO TBEC Oral Take 81 mg by mouth daily.    . MECLIZINE HCL 25 MG PO TABS Oral Take  25 mg by mouth 3 (three) times daily as needed. For dizziness    . METFORMIN HCL 500 MG PO TABS Oral Take 500 mg by mouth daily.      BP 148/68  Pulse 81  Temp(Src) 98.5 F (36.9 C) (Oral)  Resp 18  Ht 5\' 3"  (1.6 m)  Wt 145 lb 8 oz (65.998 kg)  BMI 25.77 kg/m2  SpO2 97%  Physical Exam  Nursing note and vitals reviewed. Constitutional:       Awake, alert, nontoxic appearance.  HENT:  Head: Atraumatic.  Eyes: Right eye exhibits no discharge. Left  eye exhibits no discharge.  Neck: Neck supple.  Cardiovascular: Normal rate and regular rhythm.   No murmur heard. Pulmonary/Chest: Effort normal and breath sounds normal. No respiratory distress. She has no wheezes. She has no rales. She exhibits no tenderness.  Abdominal: Soft. There is no tenderness. There is no rebound.  Musculoskeletal: She exhibits no edema and no tenderness.       Baseline ROM, no obvious new focal weakness.  Neurological: She is alert.       Mental status and motor strength appears baseline for patient and situation.  Skin: No rash noted.  Psychiatric: She has a normal mood and affect.    ED Course  Procedures (including critical care time)  ECG: Sinus rhythm, ventricular rate 80, left bundle branch block, no significant change noted compared with April 2009 Labs Reviewed  CBC - Abnormal; Notable for the following:    RBC 3.51 (*)    Hemoglobin 9.7 (*)    HCT 30.3 (*)    All other components within normal limits  POCT I-STAT, CHEM 8 - Abnormal; Notable for the following:    Creatinine, Ser 1.40 (*)    Hemoglobin 9.9 (*)    HCT 29.0 (*)    All other components within normal limits  GLUCOSE, CAPILLARY - Abnormal; Notable for the following:    Glucose-Capillary 124 (*)    All other components within normal limits  PRO B NATRIURETIC PEPTIDE - Abnormal; Notable for the following:    Pro B Natriuretic peptide (BNP) 2219.0 (*)    All other components within normal limits  BASIC METABOLIC PANEL - Abnormal; Notable for the following:    Glucose, Bld 107 (*)    BUN 25 (*)    Creatinine, Ser 1.27 (*)    GFR calc non Af Amer 37 (*)    GFR calc Af Amer 43 (*)    All other components within normal limits  CBC - Abnormal; Notable for the following:    RBC 3.39 (*)    Hemoglobin 9.4 (*)    HCT 29.1 (*)    All other components within normal limits  GLUCOSE, CAPILLARY - Abnormal; Notable for the following:    Glucose-Capillary 101 (*)    All other components  within normal limits  GLUCOSE, CAPILLARY - Abnormal; Notable for the following:    Glucose-Capillary 115 (*)    All other components within normal limits  D-DIMER, QUANTITATIVE - Abnormal; Notable for the following:    D-Dimer, Quant 0.76 (*)    All other components within normal limits  GLUCOSE, CAPILLARY - Abnormal; Notable for the following:    Glucose-Capillary 129 (*)    All other components within normal limits  DIFFERENTIAL  POCT I-STAT TROPONIN I  GLUCOSE, CAPILLARY  CARDIAC PANEL(CRET KIN+CKTOT+MB+TROPI)  CARDIAC PANEL(CRET KIN+CKTOT+MB+TROPI)  CARDIAC PANEL(CRET KIN+CKTOT+MB+TROPI)  TSH  PROTIME-INR  LAB REPORT - SCANNED   No  results found.   1. Chest pain   2. Chronic ischemic heart disease, unspecified   3. Coronary atherosclerosis of unspecified type of vessel, native or graft   4. Type II diabetes mellitus   5. HYPERLIPIDEMIA       MDM  Patient / Family / Caregiver understand and agree with initial ED impression and plan with expectations set for ED visit.Pt stable in ED with no significant deterioration in condition.Patient / Family / Caregiver informed of clinical course, understand medical decision-making process, and agree with plan.The patient appears reasonably stabilized for admission considering the current resources, flow, and capabilities available in the ED at this time, and I doubt any other [facility] requiring further screening and/or treatment in the ED prior to Cards admission.        Hurman Horn, MD 02/10/12 2236

## 2012-02-08 NOTE — ED Notes (Signed)
CALLED FLOOR TO GIVE REPORT , NURSE NOT READY AT THIS TIME.  

## 2012-02-08 NOTE — ED Notes (Signed)
Brought in via [facility] from home. Patient had an onset of CP today around 1:30PM,Pain radiated into both arms the left and then right.Skin warm and dry, no Nausea or vomiting. CBG 185 PTA.

## 2012-02-08 NOTE — H&P (Signed)
Cardiology History and Physical  METZ,CHRISTINE, MD  History of Present Illness (and review of medical records): [name] [name] is a 78 y.o. female who presents for evaluation of chest pain.  She has known dilated CMP, now with normalized EF, HTN, hx of PE now off coumadin who reports chest pain x 1 day.  Pain began around 2pm at rest and was located mid sternal with radiation to both arms.  Pain was 5-6/10 with no associated symptoms.  She denied any alleviating or aggravating symptoms.  As pain lasted longer than , she called EMS.  She took one ASA at home and was given 3 more along with NTG by EMS.  She reports that she has not taken he lisinopril for past 4-5 days.  She is currently chest pain free in ED with baseline LBBB and negative biomarkers.  Review of Systems Further review of systems was otherwise negative other than stated in HPI.  Patient Active Problem List  Diagnoses Date Noted  . Chest pain 02/08/2012  . PE 10/01/2009  . CARDIOMYOPATHY, DILATED 10/01/2009  . WALKING DIFFICULTY 10/01/2009  . DYSTHYMIC DISORDER 07/06/2009  . UNSPECIFIED CHRONIC ISCHEMIC HEART DISEASE 07/06/2009  . LBBB 07/06/2009  . CORONARY ARTERY DISEASE 12/12/2007  . BACK PAIN, THORACIC REGION, CHRONIC 12/12/2007  . HYPERLIPIDEMIA 09/11/2007  . CATARACTS 09/11/2007  . HYPERTENSION 09/11/2007  . OVERACTIVE BLADDER 09/11/2007  . ARTHRITIS 09/11/2007   No past medical history on file.  No past surgical history on file.   (Not in a hospital admission) No Known Allergies  History  Substance Use Topics  . Smoking status: Not on file  . Smokeless tobacco: Not on file  . Alcohol Use: Not on file    No family history on file.   Objective: Patient Vitals for the past 8 hrs:  BP Temp Temp src Pulse Resp SpO2 Height  02/08/12 2209 151/66 mmHg - - 77  20  97 % -  02/08/12 2019 153/60 mmHg - - 89  14  99 % -  02/08/12 1905 182/82 mmHg 98.2 F (36.8 C) Oral 85  - 97 % -  02/08/12 1840 146/55  mmHg - - 80  - 96 % -  02/08/12 1811 126/50 mmHg 98.1 F (36.7 C) Oral 75  - 94 % -  02/08/12 1810 - 98.5 F (36.9 C) Oral - 16  97 % -  02/08/12 1530 161/78 mmHg - - 79  25  97 % -  02/08/12 1520 171/63 mmHg 98.7 F (37.1 C) Oral 83  18  99 % -  02/08/12 1519 171/63 mmHg 98.1 F (36.7 C) Oral 82  - 99 % -  02/08/12 1504 - - - - - - 5' 4.5" (1.638 m)   General Appearance:    Alert, cooperative, no distress, appears stated age  Head:    Normocephalic, without obvious abnormality, atraumatic  Eyes:     PERRL, EOMI, anicteric sclerae  Neck:   Supple, no carotid bruit or JVD  Lungs:     Clear to auscultation bilaterally, respirations unlabored  Heart:    Regular rate and rhythm, S1 and S2 normal, no murmur  Abdomen:     Soft, non-tender, normoactive bowel sounds  Extremities:   Extremities normal, atraumatic, no cyanosis or edema  Pulses:   2+ and symmetric all extremities  Skin:   no rashes or lesions  Neurologic:   No focal deficits. AAO x3   Results for orders placed during the hospital encounter of 02/08/12 (  from the past 48 hour(s))  CBC     Status: Abnormal   Collection Time   02/08/12  3:22 PM      Component Value Range Comment   WBC 7.3  4.0 - 10.5 (K/uL)    RBC 3.51 (*) 3.87 - 5.11 (MIL/uL)    Hemoglobin 9.7 (*) 12.0 - 15.0 (g/dL)    HCT 84.1 (*) 32.4 - 46.0 (%)    MCV 86.3  78.0 - 100.0 (fL)    MCH 27.6  26.0 - 34.0 (pg)    MCHC 32.0  30.0 - 36.0 (g/dL)    RDW 40.1  02.7 - 25.3 (%)    Platelets 248  150 - 400 (K/uL)   DIFFERENTIAL     Status: Normal   Collection Time   02/08/12  3:22 PM      Component Value Range Comment   Neutrophils Relative 66  43 - 77 (%)    Neutro Abs 4.8  1.7 - 7.7 (K/uL)    Lymphocytes Relative 26  12 - 46 (%)    Lymphs Abs 1.9  0.7 - 4.0 (K/uL)    Monocytes Relative 7  3 - 12 (%)    Monocytes Absolute 0.5  0.1 - 1.0 (K/uL)    Eosinophils Relative 1  0 - 5 (%)    Eosinophils Absolute 0.1  0.0 - 0.7 (K/uL)    Basophils Relative 0  0 - 1 (%)     Basophils Absolute 0.0  0.0 - 0.1 (K/uL)   POCT I-STAT TROPONIN I     Status: Normal   Collection Time   02/08/12  3:45 PM      Component Value Range Comment   Troponin i, poc 0.04  0.00 - 0.08 (ng/mL)    Comment 3            POCT I-STAT, CHEM 8     Status: Abnormal   Collection Time   02/08/12  3:46 PM      Component Value Range Comment   Sodium 142  135 - 145 (mEq/L)    Potassium 5.1  3.5 - 5.1 (mEq/L)    Chloride 111  96 - 112 (mEq/L)    BUN 21  6 - 23 (mg/dL)    Creatinine, Ser 6.64 (*) 0.50 - 1.10 (mg/dL)    Glucose, Bld 92  70 - 99 (mg/dL)    Calcium, Ion 4.03  1.12 - 1.32 (mmol/L)    TCO2 23  0 - 100 (mmol/L)    Hemoglobin 9.9 (*) 12.0 - 15.0 (g/dL)    HCT 47.4 (*) 25.9 - 46.0 (%)   GLUCOSE, CAPILLARY     Status: Normal   Collection Time   02/08/12  6:06 PM      Component Value Range Comment   Glucose-Capillary 93  70 - 99 (mg/dL)    Comment 1 Documented in Chart      Comment 2 Notify RN      Dg Chest Port 1 View  02/08/2012  *RADIOLOGY REPORT*  Clinical Data: Chest pain.  PORTABLE CHEST - 1 VIEW  Comparison: None.  Findings: Trachea is midline.  Heart is at the upper limits of normal in size.  Thoracic aorta is calcified.  Minimal bibasilar atelectasis and/or scarring.  Lungs are otherwise clear.  No pleural fluid.  IMPRESSION: Minimal bibasilar atelectasis and/or scarring.  Original Report Authenticated By: Reyes Ivan, M.D.    ECG:  Sinus rhythm HR 80 LBBB, relatively unchanged from prior  CATH  2004: SELECTIVE CORONARY ANGIOGRAPHY:  1. The left main coronary artery was short and there were near separate  ostia of the LAD and circumflex coronary artery.  2. The left anterior descending artery is a moderate-sized vessel. There  was a diffuse and calcified 60% stenosis following the takeoff of the  second diagonal branch. The remainder of the LAD was free of flow-  limiting lesions. The first and second diagonal branches were also free  of flow-limiting  lesions.  3. Circumflex coronary artery was a large-caliber vessel providing a large  first and second obtuse marginal which were free of flow-limiting  disease.  4. The right coronary artery again was a large-caliber vessel and the  circulation was right-dominant.  5. The posterior descending artery had a diffuse tight 40% stenosis but the  remainder of the right circulation was free of flow-limiting lesions.   Assessment: 70F with known DCMP, non-obstructive CAD as above, HTN, hx of PE, HLD, chronic LBBB, presents with chest pain with negative biomarkers and no change ecg.  Plan:  1. Admit to Cardiology, Telemetry Unit 2. Repeat ekg on admit, prn chest pain or arrythmia 3. Trend cardiac biomarkers, check lipids, hgba1c, tsh 4. Medical management to include ASA, NTG prn, BB and ACEI not on med list, will need to confirm and restart 5. Hold metform, SSI 6. Keep NPO for non invasive ischemic evaluation with possible nuclear perfusion study.

## 2012-02-09 ENCOUNTER — Encounter (HOSPITAL_COMMUNITY): Payer: Self-pay | Admitting: General Practice

## 2012-02-09 DIAGNOSIS — I259 Chronic ischemic heart disease, unspecified: Secondary | ICD-10-CM

## 2012-02-09 DIAGNOSIS — R079 Chest pain, unspecified: Principal | ICD-10-CM

## 2012-02-09 DIAGNOSIS — E119 Type 2 diabetes mellitus without complications: Secondary | ICD-10-CM | POA: Diagnosis present

## 2012-02-09 DIAGNOSIS — I2510 Atherosclerotic heart disease of native coronary artery without angina pectoris: Secondary | ICD-10-CM

## 2012-02-09 LAB — CBC
HCT: 29.1 % — ABNORMAL LOW (ref 36.0–46.0)
Hemoglobin: 9.4 g/dL — ABNORMAL LOW (ref 12.0–15.0)
MCV: 85.8 fL (ref 78.0–100.0)
RBC: 3.39 MIL/uL — ABNORMAL LOW (ref 3.87–5.11)
WBC: 5.7 10*3/uL (ref 4.0–10.5)

## 2012-02-09 LAB — GLUCOSE, CAPILLARY
Glucose-Capillary: 115 mg/dL — ABNORMAL HIGH (ref 70–99)
Glucose-Capillary: 129 mg/dL — ABNORMAL HIGH (ref 70–99)

## 2012-02-09 LAB — CARDIAC PANEL(CRET KIN+CKTOT+MB+TROPI)
Relative Index: INVALID (ref 0.0–2.5)
Relative Index: INVALID (ref 0.0–2.5)
Total CK: 66 U/L (ref 7–177)
Total CK: 62 U/L (ref 7–177)
Total CK: 66 U/L (ref 7–177)

## 2012-02-09 LAB — BASIC METABOLIC PANEL
BUN: 25 mg/dL — ABNORMAL HIGH (ref 6–23)
CO2: 22 mEq/L (ref 19–32)
Chloride: 109 mEq/L (ref 96–112)
GFR calc non Af Amer: 37 mL/min — ABNORMAL LOW (ref >90)
Glucose, Bld: 107 mg/dL — ABNORMAL HIGH (ref 70–99)
Potassium: 4.8 mEq/L (ref 3.5–5.1)

## 2012-02-09 LAB — D-DIMER, QUANTITATIVE: D-Dimer, Quant: 0.76 ug/mL-FEU — ABNORMAL HIGH (ref 0.00–0.48)

## 2012-02-09 MED ORDER — INSULIN ASPART 100 UNIT/ML ~~LOC~~ SOLN
0.0000 [IU] | Freq: Three times a day (TID) | SUBCUTANEOUS | Status: DC
  Administered 2012-02-09: 2 [IU] via SUBCUTANEOUS

## 2012-02-09 MED ORDER — INSULIN ASPART 100 UNIT/ML ~~LOC~~ SOLN: 0.0000 [IU] | Freq: Every day | SUBCUTANEOUS | Status: DC

## 2012-02-09 MED ORDER — SODIUM CHLORIDE 0.9 % IV SOLN
INTRAVENOUS | Status: AC
  Administered 2012-02-09: 04:00:00 via INTRAVENOUS

## 2012-02-09 MED ORDER — DOCUSATE SODIUM 100 MG PO CAPS
100.0000 mg | ORAL_CAPSULE | Freq: Every day | ORAL | Status: DC | PRN
  Filled 2012-02-09: qty 1

## 2012-02-09 MED ORDER — HEPARIN SODIUM (PORCINE) 5000 UNIT/ML IJ SOLN
5000.0000 [IU] | Freq: Three times a day (TID) | INTRAMUSCULAR | Status: DC
  Administered 2012-02-09: 5000 [IU] via SUBCUTANEOUS
  Filled 2012-02-09 (×4): qty 1

## 2012-02-09 MED ORDER — ASPIRIN EC 81 MG PO TBEC
81.0000 mg | DELAYED_RELEASE_TABLET | Freq: Every day | ORAL | Status: DC
  Administered 2012-02-09: 81 mg via ORAL
  Filled 2012-02-09: qty 1

## 2012-02-09 MED ORDER — SODIUM CHLORIDE 0.9 % IJ SOLN
3.0000 mL | Freq: Two times a day (BID) | INTRAMUSCULAR | Status: DC
  Administered 2012-02-09: 3 mL via INTRAVENOUS

## 2012-02-09 NOTE — Care Management Note (Signed)
    Page 1 of 1   02/10/2012     9:15:51 AM   CARE MANAGEMENT NOTE 02/10/2012  Patient:  [name] [name], [name] [name]   Account Number:  [id number]  Date Initiated:  02/09/2012  Documentation initiated by:  GRAVES-BIGELOW,BRENDA  Subjective/Objective Assessment:   PT ADMITTED WITH CP. PLAN TO F/U POSSIBLE D/C TODAY.     Action/Plan:   CM WILL CONTINUE OT MONITOR FOR DISPOSITION NEEDS.   Anticipated DC Date:  02/09/2012   Anticipated DC Plan:  HOME/SELF CARE      DC Planning Services  CM consult      Choice offered to / List presented to:             Status of service:  Completed, signed off Medicare Important Message given?   (If response is "NO", the following Medicare IM given date fields will be blank) Date Medicare IM given:   Date Additional Medicare IM given:    Discharge Disposition:  HOME/SELF CARE  Per UR Regulation:  Reviewed for med. necessity/level of care/duration of stay  If discussed at Long Length of Stay Meetings, dates discussed:    Comments:

## 2012-02-09 NOTE — Progress Notes (Signed)
UR Completed Yorley Buch Graves-Bigelow, RN,BSN [phone]  

## 2012-02-09 NOTE — Progress Notes (Signed)
DC orders received.  Patient stable with no S/S of distress.  Medication and discharge information reviewed with patient.  Patient DC home with family. Phillips, Perkins Molina Marie  

## 2012-02-09 NOTE — Progress Notes (Signed)
Subjective: No SOB or CP. Objective: Filed Vitals:   02/08/12 2209 02/08/12 2351 02/09/12 0000 02/09/12 0500  BP: 151/66  158/75 125/74  Pulse: 77 75 75 69  Temp:   98.7 F (37.1 C) 98.1 F (36.7 C)  TempSrc:   Oral Oral  Resp: 20 20 18 18   Height:   5\' 3"  (1.6 m)   Weight:   145 lb 8 oz (65.998 kg)   SpO2: 97% 99% 98% 98%   Weight change:  No intake or output data in the 24 hours ending 02/09/12 0836  General: Alert, awake, oriented x3, in no acute distress Neck:  JVP is normal Heart: Regular rate and rhythm, without murmurs, rubs, gallops.  Lungs: Clear to auscultation.  No rales or wheezes. Exemities:  No edema.   Neuro: Grossly intact, nonfocal.  Tele:  SR   Lab Results: Results for orders placed during the hospital encounter of 02/08/12 (from the past 24 hour(s))  CBC     Status: Abnormal   Collection Time   02/08/12  3:22 PM      Component Value Range   WBC 7.3  4.0 - 10.5 (K/uL)   RBC 3.51 (*) 3.87 - 5.11 (MIL/uL)   Hemoglobin 9.7 (*) 12.0 - 15.0 (g/dL)   HCT 45.4 (*) 09.8 - 46.0 (%)   MCV 86.3  78.0 - 100.0 (fL)   MCH 27.6  26.0 - 34.0 (pg)   MCHC 32.0  30.0 - 36.0 (g/dL)   RDW 11.9  14.7 - 82.9 (%)   Platelets 248  150 - 400 (K/uL)  DIFFERENTIAL     Status: Normal   Collection Time   02/08/12  3:22 PM      Component Value Range   Neutrophils Relative 66  43 - 77 (%)   Neutro Abs 4.8  1.7 - 7.7 (K/uL)   Lymphocytes Relative 26  12 - 46 (%)   Lymphs Abs 1.9  0.7 - 4.0 (K/uL)   Monocytes Relative 7  3 - 12 (%)   Monocytes Absolute 0.5  0.1 - 1.0 (K/uL)   Eosinophils Relative 1  0 - 5 (%)   Eosinophils Absolute 0.1  0.0 - 0.7 (K/uL)   Basophils Relative 0  0 - 1 (%)   Basophils Absolute 0.0  0.0 - 0.1 (K/uL)  POCT I-STAT TROPONIN I     Status: Normal   Collection Time   02/08/12  3:45 PM      Component Value Range   Troponin i, poc 0.04  0.00 - 0.08 (ng/mL)   Comment 3           POCT I-STAT, CHEM 8     Status: Abnormal   Collection Time   02/08/12   3:46 PM      Component Value Range   Sodium 142  135 - 145 (mEq/L)   Potassium 5.1  3.5 - 5.1 (mEq/L)   Chloride 111  96 - 112 (mEq/L)   BUN 21  6 - 23 (mg/dL)   Creatinine, Ser 5.62 (*) 0.50 - 1.10 (mg/dL)   Glucose, Bld 92  70 - 99 (mg/dL)   Calcium, Ion 1.30  8.65 - 1.32 (mmol/L)   TCO2 23  0 - 100 (mmol/L)   Hemoglobin 9.9 (*) 12.0 - 15.0 (g/dL)   HCT 78.4 (*) 69.6 - 46.0 (%)  GLUCOSE, CAPILLARY     Status: Normal   Collection Time   02/08/12  6:06 PM      Component Value Range  Glucose-Capillary 93  70 - 99 (mg/dL)   Comment 1 Documented in Chart     Comment 2 Notify RN    GLUCOSE, CAPILLARY     Status: Abnormal   Collection Time   02/08/12 11:35 PM      Component Value Range   Glucose-Capillary 124 (*) 70 - 99 (mg/dL)  CARDIAC PANEL(CRET KIN+CKTOT+MB+TROPI)     Status: Normal   Collection Time   02/09/12 12:34 AM      Component Value Range   Total CK 66  7 - 177 (U/L)   CK, MB 3.8  0.3 - 4.0 (ng/mL)   Troponin I <0.30  <0.30 (ng/mL)   Relative Index RELATIVE INDEX IS INVALID  0.0 - 2.5   PRO B NATRIURETIC PEPTIDE     Status: Abnormal   Collection Time   02/09/12 12:34 AM      Component Value Range   Pro B Natriuretic peptide (BNP) 2219.0 (*) 0 - 450 (pg/mL)  GLUCOSE, CAPILLARY     Status: Abnormal   Collection Time   02/09/12 12:34 AM      Component Value Range   Glucose-Capillary 101 (*) 70 - 99 (mg/dL)   Comment 1 Notify RN    CARDIAC PANEL(CRET KIN+CKTOT+MB+TROPI)     Status: Normal   Collection Time   02/09/12  6:52 AM      Component Value Range   Total CK 62  7 - 177 (U/L)   CK, MB 3.7  0.3 - 4.0 (ng/mL)   Troponin I <0.30  <0.30 (ng/mL)   Relative Index RELATIVE INDEX IS INVALID  0.0 - 2.5   BASIC METABOLIC PANEL     Status: Abnormal   Collection Time   02/09/12  6:52 AM      Component Value Range   Sodium 143  135 - 145 (mEq/L)   Potassium 4.8  3.5 - 5.1 (mEq/L)   Chloride 109  96 - 112 (mEq/L)   CO2 22  19 - 32 (mEq/L)   Glucose, Bld 107 (*) 70 - 99  (mg/dL)   BUN 25 (*) 6 - 23 (mg/dL)   Creatinine, Ser 1.61 (*) 0.50 - 1.10 (mg/dL)   Calcium 9.4  8.4 - 09.6 (mg/dL)   GFR calc non Af Amer 37 (*) >90 (mL/min)   GFR calc Af Amer 43 (*) >90 (mL/min)  PROTIME-INR     Status: Normal   Collection Time   02/09/12  6:52 AM      Component Value Range   Prothrombin Time 14.8  11.6 - 15.2 (seconds)   INR 1.14  0.00 - 1.49   CBC     Status: Abnormal   Collection Time   02/09/12  6:52 AM      Component Value Range   WBC 5.7  4.0 - 10.5 (K/uL)   RBC 3.39 (*) 3.87 - 5.11 (MIL/uL)   Hemoglobin 9.4 (*) 12.0 - 15.0 (g/dL)   HCT 04.5 (*) 40.9 - 46.0 (%)   MCV 85.8  78.0 - 100.0 (fL)   MCH 27.7  26.0 - 34.0 (pg)   MCHC 32.3  30.0 - 36.0 (g/dL)   RDW 81.1  91.4 - 78.2 (%)   Platelets 223  150 - 400 (K/uL)  GLUCOSE, CAPILLARY     Status: Abnormal   Collection Time   02/09/12  7:32 AM      Component Value Range   Glucose-Capillary 115 (*) 70 - 99 (mg/dL)   Comment 1 Notify RN  Studies/Results: Dg Chest Port 1 View  02/08/2012  *RADIOLOGY REPORT*  Clinical Data: Chest pain.  PORTABLE CHEST - 1 VIEW  Comparison: None.  Findings: Trachea is midline.  Heart is at the upper limits of normal in size.  Thoracic aorta is calcified.  Minimal bibasilar atelectasis and/or scarring.  Lungs are otherwise clear.  No pleural fluid.  IMPRESSION: Minimal bibasilar atelectasis and/or scarring.  Original Report Authenticated By: Reyes Ivan, M.D.    Medications: I have reviewed the patient's current medications.   Patient Active Hospital Problem List: Chest pain (02/08/2012)   Assessment: Was really epigastric pain  Gone  No SOB  I am not convinced cardiac Enzymes neg so far.  Interesting is increased BNP  Does not appear to be fluid overloaded. Will recheck  Check enzymes later today  If neg d/c home  May just need close clnical f/u   I am nto convinced nec needs myoview.  Hx PE  Off coumadin since last June.  REnal insuff:  Mild.       LOS: 1  day   Dietrich Pates 02/09/2012, 8:36 AM

## 2012-02-09 NOTE — Discharge Summary (Signed)
CARDIOLOGY DISCHARGE SUMMARY   Patient ID: YEVONNE YOKUM MRN: [id number] DOB/AGE: 1924/07/29 78 y.o.  Admit date: 02/08/2012 Discharge date: 02/09/2012  Primary Discharge Diagnosis:  Chest pain Secondary Discharge Diagnosis:  Past Medical History  Diagnosis Date  . Hypertension   . High cholesterol   . Type II diabetes mellitus   . Depression    Hospital Course: Ms Urquilla is an 78 year old female with a history of non-obstructive CAD. She has a history of dilated CM but her EF had normalized. She also has a history of PE but is now off coumadin. She had chest pain and  called EMS when her pain did not resolve. She came to the ER where her pain resolved after ASA/NTG. She was admitted for further evaluation and treatment.  Her cardiac enzymes were negative for MI. Her chest pain did not recur and her ECG has a left BBB which is unchanged. Her D-dimer was slightly elevated but this may be due to renal insufficiency as she has CKD, stage 3. Her BNP was slightly elevated but her CXR did not show any acute problems. She rested comfortably overnight.  On 5/16, she was evaluated by Dr Tenny Craw. She has not had more chest pain and she is not SOB.  She is considered stable for discharge, to follow up as an outpatient in [place]. A stress test can be considered as an outpatient if she has recurrent symptoms.   Labs:   Lab Results  Component Value Date   WBC 5.7 02/09/2012   HGB 9.4* 02/09/2012   HCT 29.1* 02/09/2012   MCV 85.8 02/09/2012   PLT 223 02/09/2012    Lab 02/09/12 0652  NA 143  K 4.8  CL 109  CO2 22  BUN 25*  CREATININE 1.27*  CALCIUM 9.4  PROT --  BILITOT --  ALKPHOS --  ALT --  AST --  GLUCOSE 107*    Basename 02/09/12 1145 02/09/12 0652 02/09/12 0034  CKTOTAL 66 62 66  CKMB 3.5 3.7 3.8  CKMBINDEX -- -- --  TROPONINI <0.30 <0.30 <0.30   Pro B Natriuretic peptide (BNP)  Date/Time Value Range Status  02/09/2012 12:34 AM 2219.0* 0-450 (pg/mL) Final    Basename  02/09/12 0652  INR 1.14      Radiology: Dg Chest Port 1 View 02/08/2012  *RADIOLOGY REPORT*  Clinical Data: Chest pain.  PORTABLE CHEST - 1 VIEW  Comparison: None.  Findings: Trachea is midline.  Heart is at the upper limits of normal in size.  Thoracic aorta is calcified.  Minimal bibasilar atelectasis and/or scarring.  Lungs are otherwise clear.  No pleural fluid.  IMPRESSION: Minimal bibasilar atelectasis and/or scarring.  Original Report Authenticated By: Reyes Ivan, M.D.   Cardiac Cath:  CATH 2004:  SELECTIVE CORONARY ANGIOGRAPHY:  1. The left main coronary artery was short and there were near separate ostia of the LAD and circumflex coronary artery.  2. The left anterior descending artery is a moderate-sized vessel. There was a diffuse and calcified 60% stenosis following the takeoff of the second diagonal branch. The remainder of the LAD was free of flow-limiting lesions. The first and second diagonal branches were also free of flow-limiting lesions.  3. Circumflex coronary artery was a large-caliber vessel providing a large first and second obtuse marginal which were free of flow-limiting disease.  4. The right coronary artery again was a large-caliber vessel and the circulation was right-dominant.  5. The posterior descending artery had a diffuse tight  40% stenosis but the remainder of the right circulation was free of flow-limiting lesions  EKG:09-Feb-2012 04:51:15  Normal sinus rhythm with sinus arrhythmia Left bundle branch block Abnormal ECG Vent. rate 64 BPM PR interval 150 ms QRS duration 126 ms QT/QTc 438/451 ms P-R-T axes 40 16 139   FOLLOW UP PLANS AND APPOINTMENTS  Follow-up Information    Follow up with METZ,CHRISTINE. (As needed)    Contact information:   [address] [place] [address] [phone]       Follow up with Peyton Bottoms, MD. (June 25th at 09:45 am)    Contact information:   [address] [place] [place]  [place] [address] [phone]         No Known Allergies Medication List  As of 02/09/2012  2:21 PM   TAKE these medications         aspirin EC 81 MG tablet   Take 81 mg by mouth daily.      meclizine 25 MG tablet   Commonly known as: ANTIVERT   Take 25 mg by mouth 3 (three) times daily as needed. For dizziness      metFORMIN 500 MG tablet   Commonly known as: GLUCOPHAGE   Take 500 mg by mouth daily.             BRING ALL MEDICATIONS WITH YOU TO FOLLOW UP APPOINTMENTS  Time spent with patient to include physician time: 38 min Signed: Theodore Demark 02/09/2012, 2:21 PM Co-Sign MD

## 2012-02-23 ENCOUNTER — Encounter: Payer: Medicare Other | Admitting: Adult Health

## 2012-03-15 ENCOUNTER — Encounter: Payer: Self-pay | Admitting: Cardiology

## 2012-03-20 ENCOUNTER — Encounter: Payer: Self-pay | Admitting: Cardiology

## 2012-03-20 ENCOUNTER — Ambulatory Visit (INDEPENDENT_AMBULATORY_CARE_PROVIDER_SITE_OTHER): Payer: Medicare Other | Admitting: Cardiology

## 2012-03-20 VITALS — BP 155/69 | HR 59 | Ht 63.5 in | Wt 150.8 lb

## 2012-03-20 DIAGNOSIS — I428 Other cardiomyopathies: Secondary | ICD-10-CM

## 2012-03-20 DIAGNOSIS — I447 Left bundle-branch block, unspecified: Secondary | ICD-10-CM

## 2012-03-20 DIAGNOSIS — Z79899 Other long term (current) drug therapy: Secondary | ICD-10-CM

## 2012-03-20 DIAGNOSIS — E785 Hyperlipidemia, unspecified: Secondary | ICD-10-CM

## 2012-03-20 DIAGNOSIS — I10 Essential (primary) hypertension: Secondary | ICD-10-CM

## 2012-03-20 DIAGNOSIS — R079 Chest pain, unspecified: Secondary | ICD-10-CM

## 2012-03-20 MED ORDER — CHLORTHALIDONE 25 MG PO TABS: 25.0000 mg | ORAL_TABLET | ORAL | Status: DC

## 2012-03-20 MED ORDER — ATORVASTATIN CALCIUM 20 MG PO TABS: 20.0000 mg | ORAL_TABLET | Freq: Every evening | ORAL | Status: AC

## 2012-03-20 MED ORDER — LISINOPRIL 20 MG PO TABS: 10.0000 mg | ORAL_TABLET | Freq: Every evening | ORAL | Status: DC

## 2012-03-20 NOTE — Assessment & Plan Note (Signed)
Recently hospitalized at [facility]. Ruled out for myocardial infarction. Patient did not undergo cardiac catheterization.

## 2012-03-20 NOTE — Assessment & Plan Note (Signed)
Nonischemic cardio myopathy well compensated. No change in medications

## 2012-03-20 NOTE — Patient Instructions (Signed)
   Lisinopril - take in the evening, continue current dose  Begin Chlorthalidone 25mg  every morning  Increase Lipitor to 20mg  every evening  Labs - Do in about 8 weeks - fasting lipid and liver panel  Office will contact with results Your physician wants you to follow up in:  1 year.  You will receive a reminder letter in the mail one-two months in advance.  If you don't receive a letter, please call our office to schedule the follow up appointment

## 2012-03-20 NOTE — Assessment & Plan Note (Signed)
Stable  No change

## 2012-03-20 NOTE — Progress Notes (Signed)
[name] Bottoms, MD, [facility] Board Certified in Adult Cardiovascular Medicine,Internal Medicine and Critical Care Medicine    CC: Followup patient nonischemic cardio myopathy.  HPI: Patient is doing well from a cardiac standpoint. May 15 she was admitted to [facility] for chest discomfort. The patient actually states that she had upper bowel discomfort but no chest pain. She was ruled out for myocardial infarction but no cardiac catheterization was done. The patient states that she's feeling fine. Has no chest pain shortness of breath orthopnea or PND. She has no limitations in her exercise tolerance. She reports no presyncope, syncope orthopnea or PND  PMH: reviewed and listed in Problem List in Electronic Records (and see below) Past Medical History  Diagnosis Date  . Hypertension   . Hyperlipidemia   . Type II diabetes mellitus   . Depression   . Overactive bladder   . Arthritis   . Nonischemic cardiomyopathy     EF 30% 2004, last echo 10/2006 EF=60-65%  . Coronary artery disease     Cath 2004--60% LAD--diffuse and calcified, 40% PDA  . Pulmonary embolism     status post   Past Surgical History  Procedure Date  . Cholecystectomy 1959  . Back surgery 1967; 1977  . Appendectomy 1959  . Tonsillectomy   . Cataract extraction w/ intraocular lens  implant, bilateral 2008    right eye    Allergies/SH/FHX : available in Electronic Records for review  No Known Allergies History   Social History  . Marital Status: Widowed    Spouse Name: N/A    Number of Children: 2  . Years of Education: N/A   Occupational History  . Not on file.   Social History Main Topics  . Smoking status: Never Smoker   . Smokeless tobacco: Never Used  . Alcohol Use: No     02/09/12 "beer every once in awhile w/pizza"  . Drug Use: No  . Sexually Active: No   Other Topics Concern  . Not on file   Social History Narrative  . No narrative on file   Family History  Problem Relation  Age of Onset  . Stroke Mother     deceased  .       Medications: Current Outpatient Prescriptions  Medication Sig Dispense Refill  . aspirin EC 81 MG tablet Take 81 mg by mouth daily.      Marland Kitchen lisinopril (PRINIVIL,ZESTRIL) 20 MG tablet Take 0.5 tablets (10 mg total) by mouth every evening.      . meclizine (ANTIVERT) 25 MG tablet Take 25 mg by mouth 3 (three) times daily as needed. For dizziness      . metFORMIN (GLUCOPHAGE) 500 MG tablet Take 500 mg by mouth daily.      . nitroGLYCERIN (NITROSTAT) 0.4 MG SL tablet Place 0.4 mg under the tongue every 5 (five) minutes as needed.      Marland Kitchen DISCONTD: lisinopril (PRINIVIL,ZESTRIL) 20 MG tablet Take 10 mg by mouth daily.      Marland Kitchen atorvastatin (LIPITOR) 20 MG tablet Take 1 tablet (20 mg total) by mouth every evening.  30 tablet  6  . chlorthalidone (HYGROTON) 25 MG tablet Take 1 tablet (25 mg total) by mouth every morning.  30 tablet  6    ROS: No nausea or vomiting. No fever or chills.No melena or hematochezia.No bleeding.No claudication  Physical Exam: BP 155/69  Pulse 59  Ht 5' 3.5" (1.613 m)  Wt 150 lb 12.8 oz (68.402 kg)  BMI 26.29 kg/m2 General: Well-nourished white female in no distress. Neck: Normal carotid upstroke no carotid bruit. No thyromegaly nonnodular thyroid. JVP 6 cm Lungs: Clear breath sounds bilaterally no wheezing Cardiac: Regular rate and rhythm with normal S1-S2 no murmur rubs or gallops Vascular: No edema. Normal distal pulses  Skin: Warm and dry Physcologic: Normal affect  12lead ECG: Normal sinus rhythm with left bundle branch block  Limited bedside ECHO:N/A No images are attached to the encounter.   Assessment and Plan  LBBB Stable. No change  HYPERTENSION Poorly controlled. We'll have chlorthalidone 25 mg by mouth daily and change lisinopril to be taken in the evening. The patient state that if she takes lisinopril in the morning she feels dizzy and weak.  CARDIOMYOPATHY, DILATED Nonischemic cardio  myopathy well compensated. No change in medications  Chest pain Recently hospitalized at [facility]. Ruled out for myocardial infarction. Patient did not undergo cardiac catheterization.   Patient Active Problem List  Diagnosis  . HYPERLIPIDEMIA  . DYSTHYMIC DISORDER  . CATARACTS  . HYPERTENSION  . CORONARY ARTERY DISEASE  . UNSPECIFIED CHRONIC ISCHEMIC HEART DISEASE  . PE  . CARDIOMYOPATHY, DILATED  . LBBB  . OVERACTIVE BLADDER  . ARTHRITIS  . WALKING DIFFICULTY  . BACK PAIN, THORACIC REGION, CHRONIC  . Chest pain  . Type II diabetes mellitus

## 2012-03-20 NOTE — Assessment & Plan Note (Addendum)
Poorly controlled. We'll have chlorthalidone 25 mg by mouth daily and change lisinopril to be taken in the evening. The patient state that if she takes lisinopril in the morning she feels dizzy and weak.

## 2016-06-15 ENCOUNTER — Encounter (HOSPITAL_COMMUNITY): Payer: Self-pay | Admitting: Emergency Medicine

## 2016-06-15 ENCOUNTER — Observation Stay (HOSPITAL_COMMUNITY)
Admission: EM | Admit: 2016-06-15 | Discharge: 2016-06-16 | Disposition: A | Discharge status: Home / Self Care | Payer: Medicare Other | Attending: Family Medicine | Admitting: Family Medicine

## 2016-06-15 ENCOUNTER — Emergency Department (HOSPITAL_COMMUNITY): Payer: Medicare Other

## 2016-06-15 DIAGNOSIS — I10 Essential (primary) hypertension: Secondary | ICD-10-CM | POA: Diagnosis not present

## 2016-06-15 DIAGNOSIS — I447 Left bundle-branch block, unspecified: Secondary | ICD-10-CM

## 2016-06-15 DIAGNOSIS — I2510 Atherosclerotic heart disease of native coronary artery without angina pectoris: Secondary | ICD-10-CM | POA: Diagnosis not present

## 2016-06-15 DIAGNOSIS — R079 Chest pain, unspecified: Secondary | ICD-10-CM | POA: Diagnosis not present

## 2016-06-15 DIAGNOSIS — Z7982 Long term (current) use of aspirin: Secondary | ICD-10-CM | POA: Diagnosis not present

## 2016-06-15 DIAGNOSIS — I25119 Atherosclerotic heart disease of native coronary artery with unspecified angina pectoris: Secondary | ICD-10-CM | POA: Diagnosis not present

## 2016-06-15 DIAGNOSIS — R0789 Other chest pain: Secondary | ICD-10-CM | POA: Diagnosis not present

## 2016-06-15 DIAGNOSIS — Z79899 Other long term (current) drug therapy: Secondary | ICD-10-CM | POA: Diagnosis not present

## 2016-06-15 DIAGNOSIS — Z7984 Long term (current) use of oral hypoglycemic drugs: Secondary | ICD-10-CM | POA: Diagnosis not present

## 2016-06-15 DIAGNOSIS — E119 Type 2 diabetes mellitus without complications: Secondary | ICD-10-CM | POA: Diagnosis not present

## 2016-06-15 LAB — COMPREHENSIVE METABOLIC PANEL
ALBUMIN: 3.6 g/dL (ref 3.5–5.0)
ALK PHOS: 76 U/L (ref 38–126)
ALT: 8 U/L — ABNORMAL LOW (ref 14–54)
ANION GAP: 6 (ref 5–15)
AST: 15 U/L (ref 15–41)
BILIRUBIN TOTAL: 0.7 mg/dL (ref 0.3–1.2)
BUN: 22 mg/dL — ABNORMAL HIGH (ref 6–20)
CALCIUM: 9.6 mg/dL (ref 8.9–10.3)
CO2: 23 mmol/L (ref 22–32)
Chloride: 112 mmol/L — ABNORMAL HIGH (ref 101–111)
Creatinine, Ser: 1.46 mg/dL — ABNORMAL HIGH (ref 0.44–1.00)
GFR calc non Af Amer: 30 mL/min — ABNORMAL LOW (ref >60)
GFR, EST AFRICAN AMERICAN: 35 mL/min — AB (ref >60)
Glucose, Bld: 87 mg/dL (ref 65–99)
POTASSIUM: 4.7 mmol/L (ref 3.5–5.1)
SODIUM: 141 mmol/L (ref 135–145)
TOTAL PROTEIN: 6 g/dL — AB (ref 6.5–8.1)

## 2016-06-15 LAB — CBC
HCT: 27.4 % — ABNORMAL LOW (ref 36.0–46.0)
HEMATOCRIT: 27.8 % — AB (ref 36.0–46.0)
HEMOGLOBIN: 8.4 g/dL — AB (ref 12.0–15.0)
HEMOGLOBIN: 8.2 g/dL — AB (ref 12.0–15.0)
MCH: 26.6 pg (ref 26.0–34.0)
MCH: 26.4 pg (ref 26.0–34.0)
MCHC: 30.2 g/dL (ref 30.0–36.0)
MCHC: 29.9 g/dL — AB (ref 30.0–36.0)
MCV: 88 fL (ref 78.0–100.0)
MCV: 88.1 fL (ref 78.0–100.0)
PLATELETS: 184 10*3/uL (ref 150–400)
Platelets: 179 10*3/uL (ref 150–400)
RBC: 3.16 MIL/uL — ABNORMAL LOW (ref 3.87–5.11)
RBC: 3.11 MIL/uL — ABNORMAL LOW (ref 3.87–5.11)
RDW: 17 % — AB (ref 11.5–15.5)
RDW: 17.1 % — ABNORMAL HIGH (ref 11.5–15.5)
WBC: 3.9 10*3/uL — ABNORMAL LOW (ref 4.0–10.5)
WBC: 3.9 10*3/uL — ABNORMAL LOW (ref 4.0–10.5)

## 2016-06-15 LAB — CREATININE, SERUM
CREATININE: 1.43 mg/dL — AB (ref 0.44–1.00)
GFR calc Af Amer: 36 mL/min — ABNORMAL LOW (ref >60)
GFR calc non Af Amer: 31 mL/min — ABNORMAL LOW (ref >60)

## 2016-06-15 LAB — TROPONIN I
TROPONIN I: 0.03 ng/mL — AB (ref <0.03)
TROPONIN I: 0.03 ng/mL — AB (ref <0.03)

## 2016-06-15 LAB — I-STAT TROPONIN, ED: TROPONIN I, POC: 0.02 ng/mL (ref 0.00–0.08)

## 2016-06-15 LAB — TSH: TSH: 0.668 u[IU]/mL (ref 0.350–4.500)

## 2016-06-15 LAB — GLUCOSE, CAPILLARY
Glucose-Capillary: 126 mg/dL — ABNORMAL HIGH (ref 65–99)
Glucose-Capillary: 106 mg/dL — ABNORMAL HIGH (ref 65–99)

## 2016-06-15 LAB — LIPID PANEL
CHOL/HDL RATIO: 3.7 ratio
CHOLESTEROL: 174 mg/dL (ref 0–200)
HDL: 47 mg/dL (ref >40)
LDL CALC: 105 mg/dL — AB (ref 0–99)
Triglycerides: 108 mg/dL (ref <150)
VLDL: 22 mg/dL (ref 0–40)

## 2016-06-15 MED ORDER — ATORVASTATIN CALCIUM 40 MG PO TABS
40.0000 mg | ORAL_TABLET | Freq: Every day | ORAL | Status: DC
  Administered 2016-06-15: 40 mg via ORAL
  Filled 2016-06-15: qty 1

## 2016-06-15 MED ORDER — GI COCKTAIL ~~LOC~~: 30.0000 mL | Freq: Four times a day (QID) | ORAL | Status: DC | PRN

## 2016-06-15 MED ORDER — ONDANSETRON HCL 4 MG/2ML IJ SOLN: 4.0000 mg | Freq: Four times a day (QID) | INTRAMUSCULAR | Status: DC | PRN

## 2016-06-15 MED ORDER — INSULIN ASPART 100 UNIT/ML ~~LOC~~ SOLN
0.0000 [IU] | Freq: Three times a day (TID) | SUBCUTANEOUS | Status: DC
  Administered 2016-06-15: 1 [IU] via SUBCUTANEOUS

## 2016-06-15 MED ORDER — ACETAMINOPHEN 325 MG PO TABS: 650.0000 mg | ORAL_TABLET | ORAL | Status: DC | PRN

## 2016-06-15 MED ORDER — SODIUM CHLORIDE 0.9 % IV SOLN
INTRAVENOUS | Status: DC
  Filled 2016-06-15: qty 1000

## 2016-06-15 MED ORDER — METOPROLOL TARTRATE 12.5 MG HALF TABLET
12.5000 mg | ORAL_TABLET | Freq: Two times a day (BID) | ORAL | Status: DC
  Administered 2016-06-15 – 2016-06-16 (×2): 12.5 mg via ORAL
  Filled 2016-06-15 (×2): qty 1

## 2016-06-15 MED ORDER — NITROGLYCERIN 0.4 MG SL SUBL: 0.4000 mg | SUBLINGUAL_TABLET | SUBLINGUAL | Status: DC | PRN

## 2016-06-15 MED ORDER — INSULIN ASPART 100 UNIT/ML ~~LOC~~ SOLN: 0.0000 [IU] | Freq: Every day | SUBCUTANEOUS | Status: DC

## 2016-06-15 MED ORDER — ENOXAPARIN SODIUM 30 MG/0.3ML ~~LOC~~ SOLN
30.0000 mg | SUBCUTANEOUS | Status: DC
  Administered 2016-06-15: 30 mg via SUBCUTANEOUS
  Filled 2016-06-15: qty 0.3

## 2016-06-15 MED ORDER — SODIUM CHLORIDE 0.9 % IV SOLN
INTRAVENOUS | Status: AC
  Administered 2016-06-15: 16:00:00 via INTRAVENOUS

## 2016-06-15 MED ORDER — INFLUENZA VAC SPLIT QUAD 0.5 ML IM SUSY: 0.5000 mL | PREFILLED_SYRINGE | INTRAMUSCULAR | Status: DC | PRN

## 2016-06-15 MED ORDER — ASPIRIN EC 81 MG PO TBEC
81.0000 mg | DELAYED_RELEASE_TABLET | Freq: Every day | ORAL | Status: DC
  Administered 2016-06-16: 81 mg via ORAL
  Filled 2016-06-15 (×2): qty 1

## 2016-06-15 MED ORDER — ENOXAPARIN SODIUM 40 MG/0.4ML ~~LOC~~ SOLN
40.0000 mg | SUBCUTANEOUS | Status: DC
Start: 2016-06-15 — End: 2016-06-15

## 2016-06-15 NOTE — H&P (Signed)
[facility] History and Physical Service Pager: [phone]  Patient name: [name] GravelLuster W [facility] Medical record number: [id number] Date of birth: Jul 21, 1924 Age: 82 y.o. Gender: female  Primary Care Provider: Kirstie PeriSHAH,ASHISH, MD Consultants: Cardiology Code Status: Full (discussed on admission)  Chief Complaint: chest pain  Assessment and Plan: [name] [name] is a 82 y.o. female presenting with chest pain . PMH is significant for Cardiomyopathy, CAD, HTN, LBBB, T2DM, h/o PE not on anticoagulation  # Atypical angina: Heart score 7.  EKG w/ old LBBB, no ischemic changes at this time.  CXR with small bilateral pleural effusions and Ao atherosclerosis.  iTrop 0.02.  VSS.  ?Recent admission to [facility] [place] for chest pain.  Has h/o of PE in the past.  Patient denies this.  O2 saturations normal.  No longer having SOB.  CP is not pleuritic.  Normal HR.  Wells score for PE 1.5. Low suspicion for PE at this time.  CXR with new b/l effusions.  Doubt infection (afebrile, no cough).  Could be MSK in nature but no recent abnormal activities.  Patient warrants ACS evaluation given co-morbidities, and HS of 7. - Place in observation - Telemetry - VS per floor protocol - Trend troponin - A1c, Lipid, TSH ordered - EKG in am - Cardiology consulted in ED, appreciate recs - ASA qd, nitro prn - GI cocktail prn - Metoprolol 12.5 mg and lipitor 40 started.  Consider discussion re: continuing these meds given age and QoL.  # Elevated Cr: Cr 1.46.  Unsure if patient has baseline CKD.  Last Cr 2013 was 1.27-1.40 - Normotensive, so will hold ARB for now. - Trend Cr - NS @ 75cc/h x12 hours - Avoid nephrotoxic agents  # HTN/ LBBB: normotensive on admission.  Has not had ARB this morning. - Monitor.  - Restart ARB as appropriate  # DM2: on metformin at home. - A1c - sSSI while hospitalized - CBG monitoring  FEN/GI: NS x12 hours.  HH Carb Mod diet Prophylaxis: Lovenox  sub-q  Disposition: Place in observation.  History of Present Illness:  [name] [name] is a 82 y.o. female presenting with chest pain.  She reports that chest pain started this morning when she woke up to go to the restroom.  Chest pain is in her right chest.  She reports that she had SOB during this time as well.  Symptoms were not associated with activity and were not relieved by rest, so she called her friend Mr Buck MamRoan Perkins who is her POA [facility] [phone].  He had the ambulance bring [facility] for [facility] evaluation.  She notes that she had a recent admission to [facility] [place] for a similar episode but left AMA.  She reports a h/o CAD and notes that she used to be followed by Dr Cydney OkMcGent in [place] but he left the practice and she no longer sees a cardiologist.  She denies h/o blood clot/ PE in the past.  Denies diaphoresis, nausea, vomiting, pain radiating anywhere.  She reports SOB has resolved.  She has not taken any of her meds this morning.    Additionally, she lives independently and reports that she performs ADLs independently.  She notes she is estranged from her daughters.  She has a health aid that helps her to appts and to run errands.  Review Of Systems: Per HPI with the following additions: none  ROS  Patient Active Problem List   Diagnosis Date Noted  .  Type II diabetes mellitus (HCC)   . Chest pain 02/08/2012  . PE 10/01/2009  . CARDIOMYOPATHY, DILATED 10/01/2009  . WALKING DIFFICULTY 10/01/2009  . DYSTHYMIC DISORDER 07/06/2009  . UNSPECIFIED CHRONIC ISCHEMIC HEART DISEASE 07/06/2009  . LBBB 07/06/2009  . CORONARY ARTERY DISEASE 12/12/2007  . BACK PAIN, THORACIC REGION, CHRONIC 12/12/2007  . HYPERLIPIDEMIA 09/11/2007  . CATARACTS 09/11/2007  . HYPERTENSION 09/11/2007  . OVERACTIVE BLADDER 09/11/2007  . ARTHRITIS 09/11/2007    Past Medical History: Past Medical History:  Diagnosis Date  . Arthritis   . Coronary artery disease    Cath  2004--60% LAD--diffuse and calcified, 40% PDA  . Depression   . Hyperlipidemia   . Hypertension   . Nonischemic cardiomyopathy (HCC)    EF 30% 2004, last echo 10/2006 EF=60-65%  . Overactive bladder   . Pulmonary embolism (HCC)    status post  . Type II diabetes mellitus (HCC)     Past Surgical History: Past Surgical History:  Procedure Laterality Date  . APPENDECTOMY  1959  . BACK SURGERY  1967; 1977  . CATARACT EXTRACTION W/ INTRAOCULAR LENS  IMPLANT, BILATERAL  2008   right eye  . CHOLECYSTECTOMY  1959  . TONSILLECTOMY      Social History: Social History  Substance Use Topics  . Smoking status: Never Smoker  . Smokeless tobacco: Never Used  . Alcohol use No     Comment: 02/09/12 "beer every once in awhile w/pizza"   Additional social history:   Please also refer to relevant sections of EMR.  Family History: Family History  Problem Relation Age of Onset  . Stroke Mother     deceased    Allergies and Medications: No Known Allergies No current facility-administered medications on file prior to encounter.    Current Outpatient Prescriptions on File Prior to Encounter  Medication Sig Dispense Refill  . aspirin EC 81 MG tablet Take 81 mg by mouth daily.    . metFORMIN (GLUCOPHAGE) 500 MG tablet Take 500 mg by mouth daily.    . chlorthalidone (HYGROTON) 25 MG tablet Take 1 tablet (25 mg total) by mouth every morning. 30 tablet 6  . lisinopril (PRINIVIL,ZESTRIL) 20 MG tablet Take 0.5 tablets (10 mg total) by mouth every evening. (Patient not taking: Reported on 06/15/2016)    . nitroGLYCERIN (NITROSTAT) 0.4 MG SL tablet Place 0.4 mg under the tongue every 5 (five) minutes as needed.      Objective: BP (!) 118/49   Pulse 76   Temp 98.2 F (36.8 C) (Oral)   Resp 19   Ht 5\' 4"  (1.626 m)   Wt 110 lb (49.9 kg)   SpO2 97%   BMI 18.88 kg/m  Exam: General: awake, alert, elderly female, NAD, lying in bed Eyes: sclera white, EOMI, PERRl ENTM: no teeth, MMM Neck: no  LAD, no JVD Cardiovascular: RRR, S1S2, +2 radial pulses Respiratory: good air movement, CTAB, normal WOB on room air Gastrointestinal: soft, NT/ND, +BS MSK: WWP, no edema, calves symmetric, negative Homan's, no TTP, no palpable cord Derm: thin skin, no lesions appreciated Neuro: follows commands, moves all extremities, speech normal, AOx3 Psych: mood stable, affect appropriate  Labs and Imaging: CBC BMET   Recent Labs Lab 06/15/16 0920  WBC 3.9*  HGB 8.4*  HCT 27.8*  PLT 179    Recent Labs Lab 06/15/16 0920  NA 141  K 4.7  CL 112*  CO2 23  BUN 22*  CREATININE 1.46*  GLUCOSE 87  CALCIUM 9.6  Cardiac Panel (last 3 results) No results for input(s): CKTOTAL, CKMB, TROPONINI, RELINDX in the last 72 hours.  Dg Chest 2 View  Result Date: 06/15/2016 CLINICAL DATA:  82 year old female with chest pain onset this morning. Initial encounter. EXAM: CHEST  2 VIEW COMPARISON:  10/11/2015 and earlier. FINDINGS: Upright AP and lateral views of the chest. Stable cardiomegaly and mediastinal contours. Calcified aortic atherosclerosis. Small pleural effusions greater on the left. No pneumothorax. Chronic pulmonary interstitial opacity, no acute pulmonary edema suspected. No other confluent pulmonary opacity. Osteopenia. No acute osseous abnormality identified. IMPRESSION: 1. Chronic cardiomegaly. New small left greater than right pleural effusions. No acute pulmonary edema or focal pneumonia identified. 2.  Calcified aortic atherosclerosis. Electronically Signed   By: Odessa Fleming M.D.   On: 06/15/2016 10:00    Raliegh Ip, DO 06/15/2016, 11:39 AM PGY3, [place] [facility] FPTS Intern pager: [phone], text pages welcome

## 2016-06-15 NOTE — Progress Notes (Signed)
CRITICAL VALUE ALERT  Critical value received:  TROPONIN  Date of notification:  06/15/2016  Time of notification:  1641  Critical value read back: YES  Nurse who received alert: Eduardo Osier RN   MD notified (1st page):  VIN BHAGAT, PA  Time of first page:  1645   MD notified (2nd page):   Time of second page:  Responding MD:  VIN BHAGAT, PA  Time MD responded:   VIN BHAGAT, PA, NO NEW ORDERS RECIEVED

## 2016-06-15 NOTE — Consult Note (Signed)
CARDIOLOGY CONSULT NOTE   Patient ID: [name] [name] MRN: [id number] DOB/AGE: [birth year] 82 y.o.  Admit date: 06/15/2016  Primary Physician   Kirstie PeriSHAH,ASHISH, MD Primary Cardiologist   New to Dr. Tresa EndoKelly (previously Dr. June LeapGuy E de Gent) Reason for Consultation   Chest pain Requesting Physician  Dr. Jennette KettleNeal  HPI: [name] [name] is a 82 y.o. female with a history of Non-obstructive CAD on cath 2004, chronic LBBB,  dilated cardiomyopathy (EF has been normalized), history of PE but not on any anticoagulation, hypertension, hyperlipidemia, diabetes and depression who presented for evaluation of chest pain.  Last seen by 03/20/16  Dr. June LeapGuy E de Gent. At that time she was doing well on cardiac stand point. No follow-up since then as he left the practice.  He was in usual state of health up until this morning while walking to bathroom patient had a sudden onset of right-sided chest pain. Admits to having shortness of breath at that time but no nausea, vomiting, diaphoresis or radiation of pain. She is not able to describe characteristic of chest pain states that "it's there". EMS was called by power of attorney [facility] [phone] to [facility] for further evaluation. Patient denies orthopnea, PND, syncope, lower extremity edema, palpitation, dizziness, melena or blood in her stool or urine. She lives independently by herself. Patient denies any exertional chest pain or dyspnea.  EKG shows sinus rhythm at rate of 87 bpm, chronic Bundle branch block, T-wave inversion in lead 1, aVL and V6 which also appears chronic. Point-of-care troponin 0.02. Creatinine 1.46. Hemoglobin 8.4.  CXR 1. Chronic cardiomegaly. New small left greater than right pleural effusions. No acute pulmonary edema or focal pneumonia identified. 2.  Calcified aortic atherosclerosis.  H&P indicates that patient had a recent admission [facility] due to similar episode however patient says that she went to  hospital last time more than a year ago.  Past Medical History:  Diagnosis Date  . Arthritis   . Coronary artery disease    Cath 2004--60% LAD--diffuse and calcified, 40% PDA  . Depression   . Hyperlipidemia   . Hypertension   . Nonischemic cardiomyopathy (HCC)    EF 30% 2004, last echo 10/2006 EF=60-65%  . Overactive bladder   . Pulmonary embolism (HCC)    status post  . Type II diabetes mellitus (HCC)      Past Surgical History:  Procedure Laterality Date  . APPENDECTOMY  1959  . BACK SURGERY  1967; 1977  . CATARACT EXTRACTION W/ INTRAOCULAR LENS  IMPLANT, BILATERAL  2008   right eye  . CHOLECYSTECTOMY  1959  . TONSILLECTOMY      No Known Allergies  I have reviewed the patient's current medications . aspirin EC  81 mg Oral Daily  . atorvastatin  40 mg Oral q1800  . enoxaparin (LOVENOX) injection  30 mg Subcutaneous Q24H  . insulin aspart  0-5 Units Subcutaneous QHS  . insulin aspart  0-9 Units Subcutaneous TID WC  . metoprolol tartrate  12.5 mg Oral BID   . sodium chloride 0.9 % 1,000 mL infusion     acetaminophen, gi cocktail, nitroGLYCERIN, ondansetron (ZOFRAN) IV  Prior to Admission medications   Medication Sig Start Date End Date Taking? Authorizing Provider  aspirin EC 81 MG tablet Take 81 mg by mouth daily.   Yes Historical Provider, MD  losartan (COZAAR) 25 MG tablet Take 25 mg by mouth daily.   Yes Historical Provider, MD  metFORMIN (GLUCOPHAGE)  500 MG tablet Take 500 mg by mouth daily.   Yes Historical Provider, MD  chlorthalidone (HYGROTON) 25 MG tablet Take 1 tablet (25 mg total) by mouth every morning. 03/20/12 03/20/13  June Leap, MD  lisinopril (PRINIVIL,ZESTRIL) 20 MG tablet Take 0.5 tablets (10 mg total) by mouth every evening. Patient not taking: Reported on 06/15/2016 03/20/12   June Leap, MD  nitroGLYCERIN (NITROSTAT) 0.4 MG SL tablet Place 0.4 mg under the tongue every 5 (five) minutes as needed.    Historical Provider, MD     Social  History   Social History  . Marital status: Widowed    Spouse name: N/A  . Number of children: 2  . Years of education: N/A   Occupational History  . Not on file.   Social History Main Topics  . Smoking status: Never Smoker  . Smokeless tobacco: Never Used  . Alcohol use No     Comment: 02/09/12 "beer every once in awhile w/pizza"  . Drug use: No  . Sexual activity: No   Other Topics Concern  . Not on file   Social History Narrative  . No narrative on file    Family Status  Relation Status  . Mother Deceased   cva  . Father Deceased   enlarged heart  .     Family History  Problem Relation Age of Onset  . Stroke Mother     deceased      ROS:  Full 14 point review of systems complete and found to be negative unless listed above.  Physical Exam: Blood pressure 133/80, pulse (!) 102, temperature 98.6 F (37 C), temperature source Oral, resp. rate 20, height 5' 3.5" (1.613 m), weight 111 lb 1.9 oz (50.4 kg), SpO2 100 %.  General: Thin frail female in no acute distress Head: Eyes PERRLA, No xanthomas. Normocephalic and atraumatic, oropharynx without edema or exudate.  Lungs: Resp regular and unlabored, faint rail, tender to palpation at right mid chest Heart: RRR no s3, s4, or murmurs..   Neck: No carotid bruits. No lymphadenopathy.  No JVD. Abdomen: Bowel sounds present, abdomen soft and non-tender without masses or hernias noted. Msk:  No spine or cva tenderness. No weakness, no joint deformities or effusions. Extremities: No clubbing, cyanosis or edema. DP/PT/Radials 2+ and equal bilaterally. Neuro: Alert and oriented X 3. No focal deficits noted. Psych:  Good affect, responds appropriately Skin: No rashes or lesions noted.  Labs:   Lab Results  Component Value Date   WBC 3.9 (L) 06/15/2016   HGB 8.4 (L) 06/15/2016   HCT 27.8 (L) 06/15/2016   MCV 88.0 06/15/2016   PLT 179 06/15/2016   No results for input(s): INR in the last 72 hours.  Recent Labs Lab  06/15/16 0920  NA 141  K 4.7  CL 112*  CO2 23  BUN 22*  CREATININE 1.46*  CALCIUM 9.6  PROT 6.0*  BILITOT 0.7  ALKPHOS 76  ALT 8*  AST 15  GLUCOSE 87  ALBUMIN 3.6   Magnesium  Date Value Ref Range Status  12/31/2007 2.0  Final   No results for input(s): CKTOTAL, CKMB, TROPONINI in the last 72 hours.  Recent Labs  06/15/16 0932  TROPIPOC 0.02    ECG:   shows sinus rhythm at rate of 87 bpm, chronic Bundle branch block, T-wave inversion in lead 1, aVL and V6 which also appears chronic.  CATH 2004:  SELECTIVE CORONARY ANGIOGRAPHY:  1. The left main coronary  artery was short and there were near separate ostia of the LAD and circumflex coronary artery.  2. The left anterior descending artery is a moderate-sized vessel. There was a diffuse and calcified 60% stenosis following the takeoff of the second diagonal branch. The remainder of the LAD was free of flow-limiting lesions. The first and second diagonal branches were also free of flow-limiting lesions.  3. Circumflex coronary artery was a large-caliber vessel providing a large first and second obtuse marginal which were free of flow-limiting disease.  4. The right coronary artery again was a large-caliber vessel and the circulation was right-dominant.  5. The posterior descending artery had a diffuse tight 40% stenosis but the remainder of the right circulation was free of flow-limiting lesions   Radiology:  Dg Chest 2 View  Result Date: 06/15/2016 CLINICAL DATA:  82 year old female with chest pain onset this morning. Initial encounter. EXAM: CHEST  2 VIEW COMPARISON:  10/11/2015 and earlier. FINDINGS: Upright AP and lateral views of the chest. Stable cardiomegaly and mediastinal contours. Calcified aortic atherosclerosis. Small pleural effusions greater on the left. No pneumothorax. Chronic pulmonary interstitial opacity, no acute pulmonary edema suspected. No other confluent pulmonary opacity. Osteopenia. No acute osseous  abnormality identified. IMPRESSION: 1. Chronic cardiomegaly. New small left greater than right pleural effusions. No acute pulmonary edema or focal pneumonia identified. 2.  Calcified aortic atherosclerosis. Electronically Signed   By: Odessa Fleming M.D.   On: 06/15/2016 10:00    ASSESSMENT AND PLAN:     1. Chest pain - Woke up this morning with left-sided chest pain. Unable to describe the character. However reproducible with palpation. EKG without acute changes. Troponin negative x 1. The patient is very functional and independent at home. She denies any exertional chest pain or dyspnea. Questionable recent admission at [facility]. Chest x-ray shows calcified aortic atherosclerosis. Likely medical management ongoing forward, will review with M.D.  2. Nonobstructive CAD - As noted above  3. Hypertension - Elevated at presentation, now stable. Continue metoprolol. Held home lisinopril due to a elevated creatinine  4. Anemia - Per primary  Signed: Bhagat,Bhavinkumar, PA 06/15/2016, 2:26 PM Pager [phone]  Co-Sign MD     Patient seen and examined. Agree with assessment and plan.  [name] [name] is a very pleasant 82 year old active Caucasian female who is a former patient of Dr. Earnestine Leys.  She has remote history of mild CAD documented a cardiac catheterization in 2004.  She has a history of hypertension, hyperlipidemia, type 2 diabetes mellitus, and chronic left bundle branch block.  She has remained active knitting and crocheting.  Today she experienced somewhat atypical right-sided chest discomfort.  He denies any exertional precipitation.  She was brought by EMS and was encoded as a possible STEMI.  However, she was felt not to have a STEMI and her ECG changes were due to her left bundle branch block.  Her initial troponin is negative.  She has mild renal insufficiency with a creatinine of 1.48.  Her low-dose lisinopril has been held.  Doubt ischemic etiology to her chest pain.  Will cycle enzymes.  Will  repeat ECG in a.m.  Chest x-ray reveals small pleural effusions, left greater than right and calcified aortic atherosclerosis.  She is anemic.  Will check stool guaiac.  Will follow in a.m.   Lennette Bihari, MD, [facility] 06/15/2016 7:15 PM

## 2016-06-15 NOTE — ED Notes (Signed)
Attempted to call report to 3W 

## 2016-06-15 NOTE — Progress Notes (Signed)
Transitions of Care Pharmacy Note  Plan:  Educated on atorvastatin and metoprolol  ----  [name] [name] is an 82 y.o. female who presents with a chief complaint of chest pain, here for ACS r/o. In anticipation of discharge, pharmacy has reviewed this patient's prior to admission medication history, as well as current inpatient medications listed per the [facility].  Current medication indications, dosing, frequency, and notable side effects reviewed with patient. Patient verbalized understanding of current inpatient medication regimen and is aware that the After Visit Summary when presented, will represent the most accurate medication list at discharge.   [name] [name] expressed no concerns.   Assessment: Understanding of regimen: good Understanding of indications: fair Potential of compliance: good Barriers to Obtaining Medications: No  Patient instructed to contact inpatient pharmacy team with further questions or concerns if needed.    Time spent preparing for discharge counseling: 5 mins Time spent counseling patient: 25 mins   Thank you for allowing pharmacy to be a part of this patient's care.  Allena Katz, Pharm.D. PGY1 Pharmacy Resident 9/20/20177:38 PM Pager [phone]

## 2016-06-15 NOTE — ED Triage Notes (Signed)
Pt here from home with c/o right sided chest pain ,dull in nature , no sob no nausea

## 2016-06-15 NOTE — ED Provider Notes (Signed)
[facility]-EMERGENCY DEPT Provider Note   CSN: [id number] Arrival date & time: 06/15/16  [id number]     History   Chief Complaint Chief Complaint  Patient presents with  . Chest Pain    HPI [name] [name] is a 82 y.o. female.  The history is provided by the patient. No language interpreter was used.  Chest Pain      [name] [name] is a 82 y.o. female who presents to the Emergency Department complaining of chest pain.  She awoke with right sided, constant chest pain. Pain is described as achy in nature. She has mild associated shortness of breath. No diaphoresis, cough, bowel pain, leg swelling or pain, fevers. She had similar symptoms 2 years ago and was seen in the [facility] and she states that she was told she had a light heart attack at that time.  Past Medical History:  Diagnosis Date  . Arthritis   . Coronary artery disease    Cath 2004--60% LAD--diffuse and calcified, 40% PDA  . Depression   . Hyperlipidemia   . Hypertension   . Nonischemic cardiomyopathy (HCC)    EF 30% 2004, last echo 10/2006 EF=60-65%  . Overactive bladder   . Pulmonary embolism (HCC)    status post  . Type II diabetes mellitus [facility](HCC)     Patient Active Problem List   Diagnosis Date Noted  . Chest pain 06/15/2016  . Type II diabetes mellitus (HCC)   . Chest pain 02/08/2012  . PE 10/01/2009  . Cardiomyopathy (HCC) 10/01/2009  . WALKING DIFFICULTY 10/01/2009  . DYSTHYMIC DISORDER 07/06/2009  . UNSPECIFIED CHRONIC ISCHEMIC HEART DISEASE 07/06/2009  . Left bundle branch block (LBBB) 07/06/2009  . Coronary atherosclerosis 12/12/2007  . BACK PAIN, THORACIC REGION, CHRONIC 12/12/2007  . HYPERLIPIDEMIA 09/11/2007  . CATARACTS 09/11/2007  . Essential hypertension 09/11/2007  . OVERACTIVE BLADDER 09/11/2007  . ARTHRITIS 09/11/2007    Past Surgical History:  Procedure Laterality Date  . APPENDECTOMY  1959  . BACK SURGERY  1967; 1977  . CATARACT EXTRACTION W/ INTRAOCULAR LENS  IMPLANT, BILATERAL   2008   right eye  . CHOLECYSTECTOMY  1959  . TONSILLECTOMY      OB History    No data available       Home Medications    Prior to Admission medications   Medication Sig Start Date End Date Taking? Authorizing Provider  aspirin EC 81 MG tablet Take 81 mg by mouth daily.   Yes Historical Provider, MD  losartan (COZAAR) 25 MG tablet Take 25 mg by mouth daily.   Yes Historical Provider, MD  metFORMIN (GLUCOPHAGE) 500 MG tablet Take 500 mg by mouth daily.   Yes Historical Provider, MD  chlorthalidone (HYGROTON) 25 MG tablet Take 1 tablet (25 mg total) by mouth every morning. 03/20/12 03/20/13  June LeapGuy E de Gent, MD  lisinopril (PRINIVIL,ZESTRIL) 20 MG tablet Take 0.5 tablets (10 mg total) by mouth every evening. Patient not taking: Reported on 06/15/2016 03/20/12   June LeapGuy E de Gent, MD  nitroGLYCERIN (NITROSTAT) 0.4 MG SL tablet Place 0.4 mg under the tongue every 5 (five) minutes as needed.    Historical Provider, MD    Family History Family History  Problem Relation Age of Onset  . Stroke Mother     deceased    Social History Social History  Substance Use Topics  . Smoking status: Never Smoker  . Smokeless tobacco: Never Used  . Alcohol use No     Allergies  Review of patient's allergies indicates no known allergies.   Review of Systems Review of Systems  Cardiovascular: Positive for chest pain.  All other systems reviewed and are negative.    Physical Exam Updated Vital Signs BP 133/80 (BP Location: Right Arm)   Pulse (!) 102   Temp 98.6 F (37 C) (Oral)   Resp 20   Ht 5' 3.5" (1.613 m)   Wt 111 lb 1.9 oz (50.4 kg)   SpO2 100%   BMI 19.38 kg/m   Physical Exam  Constitutional: She is oriented to person, place, and time. She appears well-developed and well-nourished.  HENT:  Head: Normocephalic and atraumatic.  Cardiovascular: Normal rate and regular rhythm.   No murmur heard. Pulmonary/Chest: Effort normal and breath sounds normal. No respiratory distress.    Right-sided chest wall tenderness palpation  Abdominal: Soft. There is no tenderness. There is no rebound and no guarding.  Musculoskeletal: She exhibits no edema or tenderness.  Neurological: She is alert and oriented to person, place, and time.  Skin: Skin is warm and dry.  Psychiatric: She has a normal mood and affect. Her behavior is normal.  Nursing note and vitals reviewed.    ED Treatments / Results  Labs (all labs ordered are listed, but only abnormal results are displayed) Labs Reviewed  CBC - Abnormal; Notable for the following:       Result Value   WBC 3.9 (*)    RBC 3.16 (*)    Hemoglobin 8.4 (*)    HCT 27.8 (*)    RDW 17.0 (*)    All other components within normal limits  COMPREHENSIVE METABOLIC PANEL - Abnormal; Notable for the following:    Chloride 112 (*)    BUN 22 (*)    Creatinine, Ser 1.46 (*)    Total Protein 6.0 (*)    ALT 8 (*)    GFR calc non Af Amer 30 (*)    GFR calc Af Amer 35 (*)    All other components within normal limits  CBC - Abnormal; Notable for the following:    WBC 3.9 (*)    RBC 3.11 (*)    Hemoglobin 8.2 (*)    HCT 27.4 (*)    MCHC 29.9 (*)    RDW 17.1 (*)    All other components within normal limits  TSH  HEMOGLOBIN A1C  LIPID PANEL  TROPONIN I  TROPONIN I  TROPONIN I  HEMOGLOBIN A1C  CREATININE, SERUM  I-STAT TROPOININ, ED    EKG  EKG Interpretation  Date/Time:  Wednesday June 15 2016 09:18:41 EDT Ventricular Rate:  89 PR Interval:    QRS Duration: 139 QT Interval:  392 QTC Calculation: 477 R Axis:   -13 Text Interpretation:  Sinus rhythm Left bundle branch block Confirmed by Lincoln Brigham [phone]) on 06/15/2016 9:35:30 AM       Radiology Dg Chest 2 View  Result Date: 06/15/2016 CLINICAL DATA:  82 year old female with chest pain onset this morning. Initial encounter. EXAM: CHEST  2 VIEW COMPARISON:  10/11/2015 and earlier. FINDINGS: Upright AP and lateral views of the chest. Stable cardiomegaly and  mediastinal contours. Calcified aortic atherosclerosis. Small pleural effusions greater on the left. No pneumothorax. Chronic pulmonary interstitial opacity, no acute pulmonary edema suspected. No other confluent pulmonary opacity. Osteopenia. No acute osseous abnormality identified. IMPRESSION: 1. Chronic cardiomegaly. New small left greater than right pleural effusions. No acute pulmonary edema or focal pneumonia identified. 2.  Calcified aortic atherosclerosis. Electronically Signed  By: Odessa Fleming M.D.   On: 06/15/2016 10:00    Procedures Procedures (including critical care time)  Medications Ordered in ED Medications  aspirin EC tablet 81 mg (81 mg Oral Not Given 06/15/16 1600)  nitroGLYCERIN (NITROSTAT) SL tablet 0.4 mg (not administered)  acetaminophen (TYLENOL) tablet 650 mg (not administered)  ondansetron (ZOFRAN) injection 4 mg (not administered)  insulin aspart (novoLOG) injection 0-9 Units (not administered)  insulin aspart (novoLOG) injection 0-5 Units (not administered)  gi cocktail (Maalox,Lidocaine,Donnatal) (not administered)  metoprolol tartrate (LOPRESSOR) tablet 12.5 mg (not administered)  atorvastatin (LIPITOR) tablet 40 mg (not administered)  enoxaparin (LOVENOX) injection 30 mg (not administered)  0.9 %  sodium chloride infusion ( Intravenous New Bag/Given 06/15/16 1549)     Initial Impression / Assessment and Plan / ED Course  I have reviewed the triage vital signs and the nursing notes.  Pertinent labs & imaging results that were available during my care of the patient were reviewed by me and considered in my medical decision making (see chart for details).  Clinical Course    Patient here for violation of chest pain, EKG with left bundle branch block that is unchanged compared to priors. STEMI was activated prior to ED arrival by EMS and this was discontinued as there is no evidence of acute MI. Plan to admit to the medicine service for observation for chest pain,  cardiology consulted given her multiple cardiac risk factors.  Final Clinical Impressions(s) / ED Diagnoses   Final diagnoses:  Chest pain, unspecified chest pain type    New Prescriptions Current Discharge Medication List       Tilden Fossa, MD 06/15/16 [id number]

## 2016-06-16 DIAGNOSIS — R0789 Other chest pain: Secondary | ICD-10-CM | POA: Diagnosis not present

## 2016-06-16 DIAGNOSIS — R079 Chest pain, unspecified: Secondary | ICD-10-CM | POA: Diagnosis not present

## 2016-06-16 DIAGNOSIS — I447 Left bundle-branch block, unspecified: Secondary | ICD-10-CM | POA: Diagnosis not present

## 2016-06-16 DIAGNOSIS — I10 Essential (primary) hypertension: Secondary | ICD-10-CM

## 2016-06-16 DIAGNOSIS — E119 Type 2 diabetes mellitus without complications: Secondary | ICD-10-CM | POA: Diagnosis not present

## 2016-06-16 DIAGNOSIS — I25110 Atherosclerotic heart disease of native coronary artery with unstable angina pectoris: Secondary | ICD-10-CM | POA: Diagnosis not present

## 2016-06-16 DIAGNOSIS — I2510 Atherosclerotic heart disease of native coronary artery without angina pectoris: Secondary | ICD-10-CM | POA: Diagnosis not present

## 2016-06-16 LAB — TROPONIN I: Troponin I: 0.03 ng/mL (ref <0.03)

## 2016-06-16 LAB — HEMOGLOBIN A1C
HEMOGLOBIN A1C: 5.5 % (ref 4.8–5.6)
MEAN PLASMA GLUCOSE: 111 mg/dL

## 2016-06-16 LAB — GLUCOSE, CAPILLARY
GLUCOSE-CAPILLARY: 93 mg/dL (ref 65–99)
GLUCOSE-CAPILLARY: 83 mg/dL (ref 65–99)

## 2016-06-16 MED ORDER — METOPROLOL TARTRATE 25 MG PO TABS: 12.5000 mg | ORAL_TABLET | Freq: Two times a day (BID) | ORAL | 0 refills | Status: DC

## 2016-06-16 NOTE — Progress Notes (Signed)
Family Medicine Teaching Service Daily Progress Note Intern Pager: [phone]  Patient name: [name] [name] Medical record number: [id number] Date of birth: 05-15-1924 Age: 82 y.o. Gender: female  Primary Care Provider: Kirstie PeriSHAH,ASHISH, MD Consultants: Cardiology Code Status: Full (discussed on admission)  Pt Overview and Major Events to Date:  9/20: admit on obs tele for ACS r/o with atypical CP  Assessment and Plan: [name] [name] is a 82 y.o. female presenting with chest pain . PMH is significant for Cardiomyopathy, CAD, HTN, LBBB, T2DM, h/o PE not on anticoagulation  1. Atypical angina: Heart score 7 upon admission.  EKG w/ old LBBB, no ischemic changes at this time.  CXR with small bilateral pleural effusions and Ao atherosclerosis. Troponin showed slight but level bump at 0.03 x3. Vitals remain stable.  Wells score for PE was 1.5. Low suspicion for PE at this time.?Recent admission to [facility] in [place] for chest pain.  Has h/o of PE in the past but pt denied.  No longer having SOB.  CP did not present pleuritic but has resolved.  CXR with new b/l effusions. Continue to doubt infection (afebrile, no cough).  Could be MSK in nature but no recent abnormal activities.  ACS evaluation given co-morbidities with cards following. TSH wnl, lipids show minimal elevation of LDL at 105. Repeat EKG on 9/21 showed NSR with LBBB consistent with previous, no new ST changes. - Place in observation on telemetry - Cardiology following, appreciate recs - VS per floor protocol - Cardiology consulted in ED, appreciate recs - Aspirin 81 mg daily, consider stopping upon d/c due to age and questionable benefit at this point - Nitro prn - GI cocktail prn - Metoprolol 12.5 mg  - Lipitor 40 mg daily, consider stopping upon d/c due to age and questionable benefit at this point  2. Elevated Cr: Cr 1.46 as of 9/20.  Unsure if patient has baseline CKD.  Last Cr 2013 was 1.27-1.40. S/p NS @ 75cc/h x12 hours. -  Normotensive, so will continue holding ARB for now - Trend Cr on 9/22 in AM if still present - Avoid nephrotoxic agents  3. HTN/ LBBB: normotensive on admission.  Has not had ARB this morning. - Monitor creatinine - Should consider stopping metoprolol and losartan given age and being normotensive - Also on chlorthalidone per home med, not given during admission, should stop upon d/c   4. DM2: controlled, on metformin at home. A1C at 5.5 on 06/15/16. CBG 90s-low100s. - SSI while hospitalized - CBG monitoring - Should consider stopping metformin given age and controlled A1C  FEN/GI: HH Carb Mod diet Prophylaxis: Lovenox sub-q  Disposition: pending cards rec, anticipate d/c to home (pt performs ADLs independently w/ health aid)  Subjective:  Afebrile and stable vitals overnight. No longer experiencing R-sided chest pain. Finished eating 75% of breakfast. Denies SOB, abd pain, n/v. Says "i am ready to go home."  Objective: Temp:  [98.2 F (36.8 C)-98.7 F (37.1 C)] 98.7 F (37.1 C) (09/21 0802) Pulse Rate:  [67-102] 67 (09/21 0802) Resp:  [16-26] 20 (09/20 1300) BP: (107-150)/(44-100) 117/62 (09/21 0802) SpO2:  [94 %-100 %] 98 % (09/21 0802) Weight:  [110 lb (49.9 kg)-111 lb 9.6 oz (50.6 kg)] 111 lb 9.6 oz (50.6 kg) (09/21 0534) Physical Exam: General: frail but very active, well nourished, well developed, in no acute distress with non-toxic appearance HEENT: normocephalic, atraumatic, moist mucous membranes Neck: supple, non-tender without lymphadenopathy CV: regular rate and rhythm without murmurs, rubs, or gallops, no reproducible  chest pain with palpation Lungs: minimal basilar crackles on left with normal work of breathing Abdomen: soft, non-tender, no masses or organomegaly palpable, normoactive bowel sounds Skin: warm, dry, no rashes or lesions, cap refill < 2 seconds Extremities: warm and well perfused, normal tone  Laboratory:  Recent Labs Lab 06/15/16 0920  06/15/16 1509  WBC 3.9* 3.9*  HGB 8.4* 8.2*  HCT 27.8* 27.4*  PLT 179 184    Recent Labs Lab 06/15/16 0920 06/15/16 1509  NA 141  --   K 4.7  --   CL 112*  --   CO2 23  --   BUN 22*  --   CREATININE 1.46* 1.43*  CALCIUM 9.6  --   PROT 6.0*  --   BILITOT 0.7  --   ALKPHOS 76  --   ALT 8*  --   AST 15  --   GLUCOSE 87  --    Troponin:  0.03>0.03>0.03 TSH:  wnl Lipids:  LDL 109 A1C:  5.5  Imaging/Diagnostic Tests: DG Chest 2 View: 06/15/16 FINDINGS: Upright AP and lateral views of the chest. Stable cardiomegaly and mediastinal contours. Calcified aortic atherosclerosis. Small pleural effusions greater on the left. No pneumothorax. Chronic pulmonary interstitial opacity, no acute pulmonary edema suspected. No other confluent pulmonary opacity. Osteopenia. No acute osseous abnormality identified.  IMPRESSION: 1. Chronic cardiomegaly. New small left greater than right pleural effusions. No acute pulmonary edema or focal pneumonia identified. 2.  Calcified aortic atherosclerosis.    Wendee Beavers, DO 06/16/2016, 8:48 AM PGY-1, [facility] FPTS Intern pager: [phone], text pages welcome

## 2016-06-16 NOTE — Discharge Instructions (Signed)
[name] [name], you were admitted for right-sided chest pain. We watched you overnight and had cardiology assess your chest pain. Your chest pain resolved over the course of your stay. We did not see any signs of a heart attack. Cardiology agreed to clear you and signed off. You will need to follow up with your PCP upon discharge within 1 week. Please talk to your PCP about your medications. Your diabetes seems to be controlled on metformin but with your kidney function, it may be better for you to not take this medication. You blood pressure was good during your stay.   You will need to schedule a follow up appointment with your PCP upon discharge within 1 week.

## 2016-06-16 NOTE — Care Management Obs Status (Signed)
MEDICARE OBSERVATION STATUS NOTIFICATION   Patient Details  Name: [name] [name] MRN: [id number] Date of Birth: October 24, 1923   Medicare Observation Status Notification Given:  Yes    Gala Lewandowsky, RN 06/16/2016, 11:14 AM

## 2016-06-16 NOTE — Progress Notes (Signed)
Report received via Shanda Bumps RN in patient's room using SBAR format, reviewed orders, labs, VS, meds and patient's general condition, assumed care of patient.

## 2016-06-16 NOTE — Progress Notes (Signed)
Patient Name: [name] [name] Date of Encounter: 06/16/2016  Principal Problem:   Chest pain Active Problems:   Essential hypertension   Coronary atherosclerosis   Cardiomyopathy (HCC)   Left bundle branch block (LBBB)   Type II diabetes mellitus (HCC)   Primary Cardiologist: Dr. Tresa EndoKelly Patient Profile: [name] [name] is a 82 y.o. female with a history of Non-obstructive CAD on cath 2004, chronic LBBB,  dilated cardiomyopathy (EF has been normalized), history of PE but not on any anticoagulation, hypertension, hyperlipidemia, diabetes and depression who presented for evaluation of chest pain.   SUBJECTIVE: Feels well, denies chest pain and SOB. Wants to go home.    OBJECTIVE Vitals:   06/15/16 2132 06/15/16 2233 06/16/16 0534 06/16/16 0802  BP: 131/64 131/64 125/62 117/62  Pulse: 85 84 71 67  Resp:      Temp: 98.4 F (36.9 C)  98.2 F (36.8 C) 98.7 F (37.1 C)  TempSrc: Oral  Oral Oral  SpO2: 97%  98% 98%  Weight:   111 lb 9.6 oz (50.6 kg)   Height:        Intake/Output Summary (Last 24 hours) at 06/16/16 0854 Last data filed at 06/16/16 0539  Gross per 24 hour  Intake             1870 ml  Output                0 ml  Net             1870 ml   Filed Weights   06/15/16 0922 06/15/16 1300 06/16/16 0534  Weight: 110 lb (49.9 kg) 111 lb 1.9 oz (50.4 kg) 111 lb 9.6 oz (50.6 kg)    PHYSICAL EXAM General: Well developed, well nourished, female in no acute distress. Head: Normocephalic, atraumatic.  Neck: Supple without bruits,no JVD. Lungs:  Resp regular and unlabored, CTA. Heart: RRR, S1, S2, no S3, S4, or murmur; no rub. Abdomen: Soft, non-tender, non-distended, BS + x 4.  Extremities: No clubbing, cyanosis,no edema.  Neuro: Alert and oriented X 3. Moves all extremities spontaneously. Psych: Normal affect.  LABS: CBC: Recent Labs  06/15/16 0920 06/15/16 1509  WBC 3.9* 3.9*  HGB 8.4* 8.2*  HCT 27.8* 27.4*  MCV 88.0 88.1  PLT 179 184   Basic  Metabolic Panel: Recent Labs  06/15/16 0920 06/15/16 1509  NA 141  --   K 4.7  --   CL 112*  --   CO2 23  --   GLUCOSE 87  --   BUN 22*  --   CREATININE 1.46* 1.43*  CALCIUM 9.6  --    Liver Function Tests: Recent Labs  06/15/16 0920  AST 15  ALT 8*  ALKPHOS 76  BILITOT 0.7  PROT 6.0*  ALBUMIN 3.6   Cardiac Enzymes: Recent Labs  06/15/16 1509 06/15/16 1830 06/15/16 2355  TROPONINI 0.03* 0.03* 0.03*    Recent Labs  06/15/16 0932  TROPIPOC 0.02   Hemoglobin A1C: Recent Labs  06/15/16 1509  HGBA1C 5.5   Fasting Lipid Panel: Recent Labs  06/15/16 1509  CHOL 174  HDL 47  LDLCALC 105*  TRIG 108  CHOLHDL 3.7   Thyroid Function Tests: Recent Labs  06/15/16 1509  TSH 0.668    Current Facility-Administered Medications:  .  acetaminophen (TYLENOL) tablet 650 mg, 650 mg, Oral, Q4H PRN, Ashly M Gottschalk, DO .  aspirin EC tablet 81 mg, 81 mg, Oral, Daily, Ashly M Gottschalk, DO .  atorvastatin (LIPITOR) tablet 40 mg, 40 mg, Oral, q1800, Ashly Hulen Skains, DO, 40 mg at 06/15/16 1703 .  enoxaparin (LOVENOX) injection 30 mg, 30 mg, Subcutaneous, Q24H, Silvana Newness, RPH, 30 mg at 06/15/16 2234 .  gi cocktail (Maalox,Lidocaine,Donnatal), 30 mL, Oral, QID PRN, Raliegh Ip, DO .  Influenza vac split quadrivalent PF (FLUARIX) injection 0.5 mL, 0.5 mL, Intramuscular, Prior to discharge, Nestor Ramp, MD .  insulin aspart (novoLOG) injection 0-5 Units, 0-5 Units, Subcutaneous, QHS, Ashly M Gottschalk, DO .  insulin aspart (novoLOG) injection 0-9 Units, 0-9 Units, Subcutaneous, TID WC, Ashly M Gottschalk, DO, 1 Units at 06/15/16 1703 .  metoprolol tartrate (LOPRESSOR) tablet 12.5 mg, 12.5 mg, Oral, BID, Ashly M Gottschalk, DO, 12.5 mg at 06/15/16 2233 .  nitroGLYCERIN (NITROSTAT) SL tablet 0.4 mg, 0.4 mg, Sublingual, Q5 min PRN, Ashly M Gottschalk, DO .  ondansetron (ZOFRAN) injection 4 mg, 4 mg, Intravenous, Q6H PRN, Raliegh Ip, DO    TELE:          ECG:   Radiology/Studies: Dg Chest 2 View  Result Date: 06/15/2016 CLINICAL DATA:  82 year old female with chest pain onset this morning. Initial encounter. EXAM: CHEST  2 VIEW COMPARISON:  10/11/2015 and earlier. FINDINGS: Upright AP and lateral views of the chest. Stable cardiomegaly and mediastinal contours. Calcified aortic atherosclerosis. Small pleural effusions greater on the left. No pneumothorax. Chronic pulmonary interstitial opacity, no acute pulmonary edema suspected. No other confluent pulmonary opacity. Osteopenia. No acute osseous abnormality identified. IMPRESSION: 1. Chronic cardiomegaly. New small left greater than right pleural effusions. No acute pulmonary edema or focal pneumonia identified. 2.  Calcified aortic atherosclerosis. Electronically Signed   By: Odessa Fleming M.D.   On: 06/15/2016 10:00     Current Medications:  . aspirin EC  81 mg Oral Daily  . atorvastatin  40 mg Oral q1800  . enoxaparin (LOVENOX) injection  30 mg Subcutaneous Q24H  . insulin aspart  0-5 Units Subcutaneous QHS  . insulin aspart  0-9 Units Subcutaneous TID WC  . metoprolol tartrate  12.5 mg Oral BID      ASSESSMENT AND PLAN: Principal Problem:   Chest pain Active Problems:   Essential hypertension   Coronary atherosclerosis   Cardiomyopathy (HCC)   Left bundle branch block (LBBB)   Type II diabetes mellitus (HCC)  1. Chest pain - Woke up yesterday morning with left-sided chest pain. Unable to describe the character. However, reproducible with palpation. EKG without acute changes. Troponin negative x 3.  The patient is very functional and independent at home. She denies any exertional chest pain or dyspnea. Chest x-ray shows calcified aortic atherosclerosis.   Would continue ASA and atorvastatin as she has DM.   2. Nonobstructive CAD - As noted above  3. Hypertension - Elevated at presentation, now stable. Continue metoprolol. ACE-I stopped due to renal insufficiency.   4.  Anemia - Per primary   Signed, Little Ishikawa , NP 8:54 AM 06/16/2016 Pager [phone]  Patient seen and examined. Agree with assessment and plan. No chest pain or dyspnea.  ECG today is unchanged with NSR at 74 with LBBB and repolarization changes. OK to dc today from cardiology perspective.   Lennette Bihari, MD, [facility] 06/16/2016 1:31 PM

## 2016-06-16 NOTE — Progress Notes (Signed)
Pt discharged home. Discharge instructions have been gone over with the patient. IV's removed. Pt given unit number and told to call if they have any concerns regarding their discharge instructions. Shterna Laramee V, RN   

## 2016-06-18 NOTE — Discharge Summary (Signed)
Family Medicine Teaching [facility] Discharge Summary  Patient name: [name] [name] [name] [name] Medical record number: [id number] Date of birth: 1924/07/05 Age: 82 y.o. Gender: female Date of Admission: 06/15/2016  Date of Discharge: 06/16/2016 Admitting Physician: Nestor Ramp, MD  Primary Care Provider: Kirstie Peri, MD Consultants: Cardiology  Indication for Hospitalization: Atypical Chest Pain  Discharge Diagnoses/Problem List:  Atypical Chest Pain Acute Kidney Injury Hypertension Diabetes Mellitus Type 2  Disposition: Home with assistance by home aid and daughter  Discharge Condition: Stable, improved  Discharge Exam:  General: frail but very active, well nourished, well developed, in no acute distress with non-toxic appearance HEENT: normocephalic, atraumatic, moist mucous membranes Neck: supple, non-tender without lymphadenopathy CV: regular rate and rhythm without murmurs, rubs, or gallops, no reproducible chest pain with palpation Lungs: minimal basilar crackles on left with normal work of breathing Abdomen: soft, non-tender, no masses or organomegaly palpable, normoactive bowel sounds Skin: warm, dry, no rashes or lesions, cap refill < 2 seconds Extremities: warm and well perfused, normal tone  Brief [name] Course:  [name] [name] a 82 y.o.femalewho presented with chest pain. PMH is significant for Cardiomyopathy, CAD, HTN, LBBB, T2DM, h/o PE not on anticoagulation.  Patient presented to ED with atypical CP and SOB the morning of 9/20. Stated symptoms began after using bathroom and were not associated with activity or relieved with rest. Cardiology was consulted. Heart score was 7 and EKG w/ old LBBB w/o acute ST changes. CXR showed bilateral pleural effusions. Troponin slight bump at 0.03 but stable. Low suspicion for PE given Wells score 1.5. CXR showed bilateral pleural effusions. PNA was unlikely given lack of fever, leukocytosis, or cough. CP and SOB resolved by the  evening of 9/20. Patient also found to have slight AKI during admission. Remained normotensive. Patient returned to baseline w/o CP or SOB the morning of 9/21 and cardiology signed off.  Issues for Follow Up:  1. Atypical CP and SOB seems be MSK in origin 2. Patient should consider d/c metoprolol given her normotensive state, advanced age, and increased risk of falls 3. Should consider discontinuing aspirin due to questionable risk vs benefit due to age and increased risk of GI bleeds 4. Need to reassess creatinine level during f/u after h/o AKI, consider discontinuing ACE-I 5. Patient was not taking chlorthalidone during admission and remained normotensive, could consider discontinuing 6. Strongly recommend discontinuing metformin given A1C 5.5 during admission and increased risk due to advanced age vs minimal benefit  Significant Procedures: None  Significant Labs and Imaging:   Recent Labs Lab 06/15/16 0920 06/15/16 1509  WBC 3.9* 3.9*  HGB 8.4* 8.2*  HCT 27.8* 27.4*  PLT 179 184    Recent Labs Lab 06/15/16 0920 06/15/16 1509  NA 141  --   K 4.7  --   CL 112*  --   CO2 23  --   GLUCOSE 87  --   BUN 22*  --   CREATININE 1.46* 1.43*  CALCIUM 9.6  --   ALKPHOS 76  --   AST 15  --   ALT 8*  --   ALBUMIN 3.6  --    Troponin:  0.03>0.03>0.03 TSH:  wnl Lipids:  LDL 109 A1C:  5.5  DG Chest 2 View: 06/15/16 FINDINGS: Upright AP and lateral views of the chest. Stable cardiomegaly and mediastinal contours. Calcified aortic atherosclerosis. Small pleural effusions greater on the left. No pneumothorax. Chronic pulmonary interstitial opacity, no acute pulmonary edema suspected. No other confluent pulmonary opacity. Osteopenia. No  acute osseous abnormality identified.  IMPRESSION: 1. Chronic cardiomegaly. New small left greater than right pleural effusions. No acute pulmonary edema or focal pneumonia identified. 2. Calcified aortic atherosclerosis.  Results/Tests  Pending at Time of Discharge: None  Discharge Medications:    Medication List    STOP taking these medications   chlorthalidone 25 MG tablet Commonly known as:  HYGROTON   losartan 25 MG tablet Commonly known as:  COZAAR   metFORMIN 500 MG tablet Commonly known as:  GLUCOPHAGE     TAKE these medications   aspirin EC 81 MG tablet Take 81 mg by mouth daily.   lisinopril 20 MG tablet Commonly known as:  PRINIVIL,ZESTRIL Take 0.5 tablets (10 mg total) by mouth every evening.   metoprolol tartrate 25 MG tablet Commonly known as:  LOPRESSOR Take 0.5 tablets (12.5 mg total) by mouth 2 (two) times daily.   nitroGLYCERIN 0.4 MG SL tablet Commonly known as:  NITROSTAT Place 0.4 mg under the tongue every 5 (five) minutes as needed.       Discharge Instructions: Please refer to Patient Instructions section of EMR for full details.  Patient was counseled important signs and symptoms that should prompt return to medical care, changes in medications, dietary instructions, activity restrictions, and follow up appointments.   Follow-Up Appointments: Follow-up Information    [facility]HAH,ASHISH, MD. Call on 06/16/2016.   Specialty:  Internal Medicine Why:  Please call and schedule [name] follow up appointment within 1 week. Contact information: [address] [place] [id number] [phone]           [name] Beaversavid J Joash Tony, DO 06/18/2016, 10:40 AM PGY-1, [facility]

## 2017-04-18 ENCOUNTER — Other Ambulatory Visit: Payer: Self-pay

## 2017-04-18 ENCOUNTER — Encounter (HOSPITAL_COMMUNITY): Payer: Self-pay | Admitting: *Deleted

## 2017-04-18 ENCOUNTER — Emergency Department (HOSPITAL_COMMUNITY): Payer: Medicare Other

## 2017-04-18 ENCOUNTER — Emergency Department (HOSPITAL_COMMUNITY)
Admission: EM | Admit: 2017-04-18 | Discharge: 2017-04-18 | Disposition: A | Discharge status: Home / Self Care | Payer: Medicare Other | Attending: Emergency Medicine | Admitting: Emergency Medicine

## 2017-04-18 DIAGNOSIS — Z79899 Other long term (current) drug therapy: Secondary | ICD-10-CM | POA: Insufficient documentation

## 2017-04-18 DIAGNOSIS — R079 Chest pain, unspecified: Secondary | ICD-10-CM | POA: Diagnosis not present

## 2017-04-18 DIAGNOSIS — E119 Type 2 diabetes mellitus without complications: Secondary | ICD-10-CM | POA: Insufficient documentation

## 2017-04-18 DIAGNOSIS — I2510 Atherosclerotic heart disease of native coronary artery without angina pectoris: Secondary | ICD-10-CM | POA: Diagnosis not present

## 2017-04-18 DIAGNOSIS — I10 Essential (primary) hypertension: Secondary | ICD-10-CM | POA: Insufficient documentation

## 2017-04-18 DIAGNOSIS — Z7982 Long term (current) use of aspirin: Secondary | ICD-10-CM | POA: Diagnosis not present

## 2017-04-18 LAB — CBC
HCT: 36 % (ref 36.0–46.0)
Hemoglobin: 11.6 g/dL — ABNORMAL LOW (ref 12.0–15.0)
MCH: 29.8 pg (ref 26.0–34.0)
MCHC: 32.2 g/dL (ref 30.0–36.0)
MCV: 92.5 fL (ref 78.0–100.0)
Platelets: 175 K/uL (ref 150–400)
RBC: 3.89 MIL/uL (ref 3.87–5.11)
RDW: 13.5 % (ref 11.5–15.5)
WBC: 7.8 K/uL (ref 4.0–10.5)

## 2017-04-18 LAB — BASIC METABOLIC PANEL
Anion gap: 8 (ref 5–15)
BUN: 29 mg/dL — AB (ref 6–20)
CHLORIDE: 107 mmol/L (ref 101–111)
CO2: 25 mmol/L (ref 22–32)
Calcium: 9.7 mg/dL (ref 8.9–10.3)
Creatinine, Ser: 1.51 mg/dL — ABNORMAL HIGH (ref 0.44–1.00)
GFR calc Af Amer: 34 mL/min — ABNORMAL LOW (ref >60)
GFR calc non Af Amer: 29 mL/min — ABNORMAL LOW (ref >60)
GLUCOSE: 95 mg/dL (ref 65–99)
POTASSIUM: 4.1 mmol/L (ref 3.5–5.1)
Sodium: 140 mmol/L (ref 135–145)

## 2017-04-18 LAB — I-STAT TROPONIN, ED: Troponin i, poc: 0.02 ng/mL (ref 0.00–0.08)

## 2017-04-18 MED ORDER — AMOXICILLIN-POT CLAVULANATE 500-125 MG PO TABS
1.0000 | ORAL_TABLET | Freq: Once | ORAL | Status: AC
  Administered 2017-04-18: 500 mg via ORAL
  Filled 2017-04-18: qty 1

## 2017-04-18 MED ORDER — AMOXICILLIN-POT CLAVULANATE 500-125 MG PO TABS: 1.0000 | ORAL_TABLET | Freq: Three times a day (TID) | ORAL | 0 refills | Status: DC

## 2017-04-18 NOTE — ED Notes (Signed)
Given patient some crackers and coffee.

## 2017-04-18 NOTE — Discharge Instructions (Signed)
The testing today was reassuring.  The chest x-ray showed a possible pneumonia, however you are not coughing or having a fever.  We are going to give you a short course of antibiotic to see if it helps the discomfort.  It is important to follow-up with your primary care doctor for checkup in 3 or 4 weeks for repeat chest x-ray, and further care is needed.  Also call them sooner if needed for problems.  Start taking the antibiotic prescription this evening.

## 2017-04-18 NOTE — ED Provider Notes (Signed)
[facility]-EMERGENCY DEPT Provider Note   CSN: [id number] Arrival date & time: 04/18/17  0732     History   Chief Complaint Chief Complaint  Patient presents with  . Chest Pain    HPI [name] [name] is a 83 y.o. female.  She presents for evaluation of chest pain which she noticed after awakening this morning.  She had not yet eaten breakfast but developed chest pain which radiated to her upper back.  Pain spontaneously improved but is still persistent.  Earlier the pain was severe, now it is mild.  She called EMS, and told them that she had some left arm pain.  She did not receive treatment during transport.  She lives alone.  She denies anorexia, nausea, vomiting, cough, shortness of breath, focal weakness or paresthesia.  She has not had to see her PCP recently.  There are no other known modifying factors.   HPI  Past Medical History:  Diagnosis Date  . Arthritis   . Coronary artery disease    Cath 2004--60% LAD--diffuse and calcified, 40% PDA  . Depression   . Hyperlipidemia   . Hypertension   . Nonischemic cardiomyopathy (HCC)    EF 30% 2004, last echo 10/2006 EF=60-65%  . Overactive bladder   . Pulmonary embolism (HCC)    status post  . Type II diabetes mellitus [facility])     Patient Active Problem List   Diagnosis Date Noted  . Pain in the chest 06/15/2016  . Type II diabetes mellitus (HCC)   . Chest pain 02/08/2012  . PE 10/01/2009  . Cardiomyopathy (HCC) 10/01/2009  . WALKING DIFFICULTY 10/01/2009  . DYSTHYMIC DISORDER 07/06/2009  . UNSPECIFIED CHRONIC ISCHEMIC HEART DISEASE 07/06/2009  . Left bundle branch block (LBBB) 07/06/2009  . Coronary atherosclerosis 12/12/2007  . BACK PAIN, THORACIC REGION, CHRONIC 12/12/2007  . HYPERLIPIDEMIA 09/11/2007  . CATARACTS 09/11/2007  . Essential hypertension 09/11/2007  . OVERACTIVE BLADDER 09/11/2007  . ARTHRITIS 09/11/2007    Past Surgical History:  Procedure Laterality Date  . APPENDECTOMY  1959  . BACK SURGERY   1967; 1977  . CATARACT EXTRACTION W/ INTRAOCULAR LENS  IMPLANT, BILATERAL  2008   right eye  . CHOLECYSTECTOMY  1959  . TONSILLECTOMY      OB History    No data available       Home Medications    Prior to Admission medications   Medication Sig Start Date End Date Taking? Authorizing Provider  atorvastatin (LIPITOR) 40 MG tablet Take 1 tablet by mouth daily. 09/22/16 09/22/17 Yes [provider]  cholecalciferol (VITAMIN D) 1000 units tablet Take 1 tablet by mouth daily. 09/23/16 09/23/17 Yes [provider]  furosemide (LASIX) 40 MG tablet Take 1 tablet by mouth daily. 09/23/16 09/23/17 Yes [provider]  isosorbide mononitrate (IMDUR) 30 MG 24 hr tablet Take 1 tablet by mouth daily. 09/22/16 09/22/17 Yes [provider]  lisinopril (PRINIVIL,ZESTRIL) 20 MG tablet Take 0.5 tablets (10 mg total) by mouth every evening. 03/20/12  Yes de Melanie Crazier, MD  metoprolol succinate (TOPROL-XL) 25 MG 24 hr tablet Take 1 tablet by mouth daily. 09/23/16 09/23/17 Yes [provider]  Multiple Vitamin (THERA) TABS Take 1 tablet by mouth daily. 09/23/16  Yes [provider]  nitroGLYCERIN (NITROSTAT) 0.4 MG SL tablet Place 0.4 mg under the tongue every 5 (five) minutes as needed.   Yes [provider]  potassium chloride SA (K-DUR,KLOR-CON) 20 MEQ tablet Take 1 tablet by mouth daily. 09/23/16 09/23/17  Yes [provider]  vitamin B-12 (CYANOCOBALAMIN) 1000 MCG tablet Take 1 tablet by mouth daily. 09/23/16 09/23/17 Yes [provider]  aspirin EC 81 MG tablet Take 81 mg by mouth daily.    [provider]    Family History Family History  Problem Relation Age of Onset  . Stroke Mother        deceased    Social History Social History  Substance Use Topics  . Smoking status: Never Smoker  . Smokeless tobacco: Never Used  . Alcohol use No     Allergies   Patient has no known allergies.   Review of  Systems Review of Systems  All other systems reviewed and are negative.    Physical Exam Updated Vital Signs BP 134/83   Pulse 84   Temp 97.9 F (36.6 C) (Oral)   Resp (!) 27   Ht 5\' 3"  (1.6 m)   Wt 52.6 kg (116 lb)   SpO2 96%   BMI 20.55 kg/m   Physical Exam  Constitutional: She is oriented to person, place, and time. She appears well-developed. No distress.  Elderly, frail  HENT:  Head: Normocephalic and atraumatic.  Eyes: Pupils are equal, round, and reactive to light. Conjunctivae and EOM are normal.  Neck: Normal range of motion and phonation normal. Neck supple.  Cardiovascular: Normal rate and regular rhythm.   2/6 systolic murmur base of heart.  Pulmonary/Chest: Effort normal and breath sounds normal. She exhibits no tenderness.  Abdominal: Soft. She exhibits no distension. There is no tenderness. There is no guarding.  Musculoskeletal: Normal range of motion. She exhibits no edema, tenderness or deformity.  Neurological: She is alert and oriented to person, place, and time. She exhibits normal muscle tone.  Skin: Skin is warm and dry.  Psychiatric: She has a normal mood and affect. Her behavior is normal. Judgment and thought content normal.  Nursing note and vitals reviewed.    ED Treatments / Results  Labs (all labs ordered are listed, but only abnormal results are displayed) Labs Reviewed  BASIC METABOLIC PANEL - Abnormal; Notable for the following:       Result Value   BUN 29 (*)    Creatinine, Ser 1.51 (*)    GFR calc non Af Amer 29 (*)    GFR calc Af Amer 34 (*)    All other components within normal limits  CBC - Abnormal; Notable for the following:    Hemoglobin 11.6 (*)    All other components within normal limits  I-STAT TROPONIN, ED    EKG  EKG Interpretation  Date/Time:  Tuesday April 18 2017 07:45:37 EDT Ventricular Rate:  81 PR Interval:    QRS Duration: 151 QT Interval:  434 QTC Calculation: 504 R Axis:   75 Text Interpretation:   Sinus rhythm Ventricular premature complex IVCD, consider atypical LBBB since last tracing no significant change Confirmed by Mancel Bale [phone]) on 04/18/2017 8:17:11 AM       Radiology Dg Chest 2 View  Result Date: 04/18/2017 CLINICAL DATA:  Generalized chest pain beginning at 4:30 a.m. today. History of hypertension, diabetes, coronary artery disease, and cardiomyopathy, nonsmoker. EXAM: CHEST  2 VIEW COMPARISON:  PA and lateral chest x-ray of June 15, 2016 FINDINGS: The right lung is hyperinflated. On the left there is chronically increased density at the lung base laterally compatible with pleural fluid or pleural thickening. The interstitial markings of both lungs are coarse but have improved since the previous study. There  is new linear increased density in the left upper lobe. The heart is normal in size. The pulmonary vascularity is not engorged. There is calcification in the wall of the thoracic aorta. There is mild multilevel degenerative disc disease of the thoracic spine. IMPRESSION: COPD. Small left pleural effusion versus pleural thickening. No pulmonary edema. Probable subsegmental atelectasis or early infiltrate in the left upper lobe. Followup PA and lateral chest X-ray is recommended in 3-4 weeks following trial of antibiotic therapy to ensure resolution and exclude underlying malignancy. Thoracic aortic atherosclerosis. Electronically Signed   By: David  [place] M.D.   On: 04/18/2017 09:18    Procedures Procedures (including critical care time)  Medications Ordered in ED Medications  amoxicillin-clavulanate (AUGMENTIN) 500-125 MG per tablet 500 mg (not administered)     Initial Impression / Assessment and Plan / ED Course  I have reviewed the triage vital signs and the nursing notes.  Pertinent labs & imaging results that were available during my care of the patient were reviewed by me and considered in my medical decision making (see chart for details).       Patient Vitals for the past 24 hrs:  BP Temp Temp src Pulse Resp SpO2 Height Weight  04/18/17 1100 134/83 - - - (!) 27 - - -  04/18/17 1043 (!) 109/52 - - 84 (!) 22 96 % - -  04/18/17 1030 (!) 109/52 - - 78 (!) 23 96 % - -  04/18/17 0930 (!) 114/51 - - 61 20 96 % - -  04/18/17 0900 110/63 - - 73 (!) 23 99 % - -  04/18/17 0830 125/63 - - 76 (!) 27 97 % - -  04/18/17 0800 119/64 - - 80 14 100 % - -  04/18/17 0741 - - - - - - 5\' 3"  (1.6 m) 52.6 kg (116 lb)  04/18/17 0740 127/65 97.9 F (36.6 C) Oral 84 16 96 % - -    12:43 PM Reevaluation with update and discussion. After initial assessment and treatment, an updated evaluation reveals she remains comfortable has no further complaints.    Findings discussed patient, all questions answered. Rogelio Winbush L    Final Clinical Impressions(s) / ED Diagnoses   Final diagnoses:  Nonspecific chest pain   Nonspecific chest pain and back pain with reassuring evaluation.  Possible left upper lobe pneumonia, versus nonspecific chest x-ray abnormality.  She is hemodynamically stable.  She will be covered with oral antibiotic, discharged and asked to follow-up with PCP.   Nursing Notes Reviewed/ Care Coordinated Applicable Imaging Reviewed Interpretation of Laboratory Data incorporated into ED treatment  The patient appears reasonably screened and/or stabilized for discharge and I doubt any other medical condition or other [facility] requiring further screening, evaluation, or treatment in the ED at this time prior to discharge.  Plan: Home Medications-continue usual medications, APAP for pain; Home Treatments-rest, fluids; return here if the recommended treatment, does not improve the symptoms; Recommended follow up-PCP 3-4 weeks for repeat chest x-ray, and as needed.    New Prescriptions New Prescriptions   No medications on file     Mancel Bale, MD 04/18/17 1246

## 2017-04-18 NOTE — ED Triage Notes (Signed)
Pt brought in by [facility] with c/o left arm and left flank pain. When EMS placed pt on monitor pt was found to have bundle branch block with appears to be new pt per family so they wanted pt brought in to ED. Pt reports she woke up at 0400 this morning with c/o mid chest pain that radiates to her back and dizziness. Denies SOB, nausea, vomiting. Pt reports she took a Meclizine when she woke up and her dizziness improved. Pt reports her chest pain is very minimal at this time and her left arm pain has now resolved.

## 2017-04-18 NOTE — ED Notes (Signed)
Called pt's POA for ride for discharge, POA states that he is at work and will not be able to pick up pt til 1500-1530 today.

## 2017-04-18 NOTE — ED Notes (Signed)
Pt ambulated in hallways with nursing assist. Pt tolerated well, steady gait. No complaints.

## 2017-04-26 ENCOUNTER — Emergency Department (HOSPITAL_COMMUNITY)
Admission: EM | Admit: 2017-04-26 | Discharge: 2017-04-26 | Disposition: A | Discharge status: Home Health | Payer: Medicare Other | Attending: Emergency Medicine | Admitting: Emergency Medicine

## 2017-04-26 ENCOUNTER — Encounter (HOSPITAL_COMMUNITY): Payer: Self-pay | Admitting: Emergency Medicine

## 2017-04-26 DIAGNOSIS — Z7982 Long term (current) use of aspirin: Secondary | ICD-10-CM | POA: Diagnosis not present

## 2017-04-26 DIAGNOSIS — Z79899 Other long term (current) drug therapy: Secondary | ICD-10-CM | POA: Insufficient documentation

## 2017-04-26 DIAGNOSIS — I10 Essential (primary) hypertension: Secondary | ICD-10-CM | POA: Insufficient documentation

## 2017-04-26 DIAGNOSIS — I2510 Atherosclerotic heart disease of native coronary artery without angina pectoris: Secondary | ICD-10-CM | POA: Diagnosis not present

## 2017-04-26 DIAGNOSIS — M791 Myalgia: Secondary | ICD-10-CM | POA: Insufficient documentation

## 2017-04-26 DIAGNOSIS — E119 Type 2 diabetes mellitus without complications: Secondary | ICD-10-CM | POA: Diagnosis not present

## 2017-04-26 DIAGNOSIS — M2550 Pain in unspecified joint: Secondary | ICD-10-CM

## 2017-04-26 LAB — CBG MONITORING, ED: Glucose-Capillary: 102 mg/dL — ABNORMAL HIGH (ref 65–99)

## 2017-04-26 MED ORDER — PREDNISONE 20 MG PO TABS: 20.0000 mg | ORAL_TABLET | Freq: Every day | ORAL | 0 refills | Status: DC

## 2017-04-26 MED ORDER — DEXAMETHASONE SODIUM PHOSPHATE 10 MG/ML IJ SOLN
6.0000 mg | Freq: Once | INTRAMUSCULAR | Status: AC
  Administered 2017-04-26: 6 mg via INTRAMUSCULAR
  Filled 2017-04-26: qty 1

## 2017-04-26 NOTE — ED Triage Notes (Signed)
Pt c/o body aches all over x one week. Pt seen pcp for the same one week ago.

## 2017-04-26 NOTE — Discharge Instructions (Signed)
Try heat for comfort. Take the pills once daily for your joint pains. Monitor your blood sugar closely while on the prednisone.  Recheck as needed.

## 2017-04-26 NOTE — ED Provider Notes (Signed)
[facility]-EMERGENCY DEPT Provider Note   CSN: [id number] Arrival date & time: 04/26/17  0559  Time seen 6:15 AM   History   Chief Complaint Chief Complaint  Patient presents with  . Generalized Body Aches    HPI [name] [name] is a 83 y.o. female.  HPI when I go in to talk to patient she starts talking about she has a friend in the ICU is very sick and they can't find out what's wrong with him. She wants to know if I know him. She is tearful. When I ask her why she is in the emergency department she states she started hurting all over yesterday. She states her knees, feet and legs are the worse. She denies any fall or injury. When I ask her if she has tried any medication for it she states "I try not to take nothing". She denies any fever, cough, sore throat, nausea, vomiting, diarrhea or dysuria. She states she saw her doctor last week in the office for shoulder pain and she got a shot in the right shoulder which helped but now is starting to hurt again also. She denies having a diagnosis of arthritis other than normal arthritis with aging. Patient states she was seen in the ED last week for same thing. However when I review that chart she was brought in for chest pain, left arm pain and left flank pain.  PCP Kirstie Peri, MD   Past Medical History:  Diagnosis Date  . Arthritis   . Coronary artery disease    Cath 2004--60% LAD--diffuse and calcified, 40% PDA  . Depression   . Hyperlipidemia   . Hypertension   . Nonischemic cardiomyopathy (HCC)    EF 30% 2004, last echo 10/2006 EF=60-65%  . Overactive bladder   . Pulmonary embolism (HCC)    status post  . Type II diabetes mellitus [facility])     Patient Active Problem List   Diagnosis Date Noted  . Pain in the chest 06/15/2016  . Type II diabetes mellitus (HCC)   . Chest pain 02/08/2012  . PE 10/01/2009  . Cardiomyopathy (HCC) 10/01/2009  . WALKING DIFFICULTY 10/01/2009  . DYSTHYMIC DISORDER 07/06/2009  . UNSPECIFIED CHRONIC  ISCHEMIC HEART DISEASE 07/06/2009  . Left bundle branch block (LBBB) 07/06/2009  . Coronary atherosclerosis 12/12/2007  . BACK PAIN, THORACIC REGION, CHRONIC 12/12/2007  . HYPERLIPIDEMIA 09/11/2007  . CATARACTS 09/11/2007  . Essential hypertension 09/11/2007  . OVERACTIVE BLADDER 09/11/2007  . ARTHRITIS 09/11/2007    Past Surgical History:  Procedure Laterality Date  . APPENDECTOMY  1959  . BACK SURGERY  1967; 1977  . CATARACT EXTRACTION W/ INTRAOCULAR LENS  IMPLANT, BILATERAL  2008   right eye  . CHOLECYSTECTOMY  1959  . TONSILLECTOMY      OB History    No data available       Home Medications    Prior to Admission medications   Medication Sig Start Date End Date Taking? Authorizing Provider  amoxicillin-clavulanate (AUGMENTIN) 500-125 MG tablet Take 1 tablet (500 mg total) by mouth every 8 (eight) hours. 04/18/17   Mancel Bale, MD  aspirin EC 81 MG tablet Take 81 mg by mouth daily.    [provider]  atorvastatin (LIPITOR) 40 MG tablet Take 1 tablet by mouth daily. 09/22/16 09/22/17  [provider]  cholecalciferol (VITAMIN D) 1000 units tablet Take 1 tablet by mouth daily. 09/23/16 09/23/17  [provider]  furosemide (LASIX) 40 MG tablet Take 1 tablet by mouth  daily. 09/23/16 09/23/17  [provider]  isosorbide mononitrate (IMDUR) 30 MG 24 hr tablet Take 1 tablet by mouth daily. 09/22/16 09/22/17  [provider]  lisinopril (PRINIVIL,ZESTRIL) 20 MG tablet Take 0.5 tablets (10 mg total) by mouth every evening. 03/20/12   de Melanie Crazier, MD  metoprolol succinate (TOPROL-XL) 25 MG 24 hr tablet Take 1 tablet by mouth daily. 09/23/16 09/23/17  [provider]  Multiple Vitamin (THERA) TABS Take 1 tablet by mouth daily. 09/23/16   [provider]  nitroGLYCERIN (NITROSTAT) 0.4 MG SL tablet Place 0.4 mg under the tongue every 5 (five) minutes as needed.    [provider]  potassium chloride SA  (K-DUR,KLOR-CON) 20 MEQ tablet Take 1 tablet by mouth daily. 09/23/16 09/23/17  [provider]  predniSONE (DELTASONE) 20 MG tablet Take 1 tablet (20 mg total) by mouth daily with breakfast. 04/26/17   Devoria Albe, MD  vitamin B-12 (CYANOCOBALAMIN) 1000 MCG tablet Take 1 tablet by mouth daily. 09/23/16 09/23/17  [provider]    Family History Family History  Problem Relation Age of Onset  . Stroke Mother        deceased    Social History Social History  Substance Use Topics  . Smoking status: Never Smoker  . Smokeless tobacco: Never Used  . Alcohol use No  Lives at home Lives alone Uses a walker   Allergies   Patient has no known allergies.   Review of Systems Review of Systems  All other systems reviewed and are negative.    Physical Exam Updated Vital Signs BP 120/72   Pulse 95   Temp 98.5 F (36.9 C)   Ht 5\' 3"  (1.6 m)   Wt 52.6 kg (116 lb)   SpO2 96%   BMI 20.55 kg/m   Vital signs normal    Physical Exam  Constitutional: She is oriented to person, place, and time.  Non-toxic appearance. She does not appear ill. No distress.  Frail elderly female  HENT:  Head: Normocephalic and atraumatic.  Right Ear: External ear normal.  Left Ear: External ear normal.  Nose: Nose normal. No mucosal edema or rhinorrhea.  Mouth/Throat: Oropharynx is clear and moist and mucous membranes are normal. No dental abscesses or uvula swelling.  Eyes: Pupils are equal, round, and reactive to light. Conjunctivae and EOM are normal.  Neck: Normal range of motion and full passive range of motion without pain. Neck supple.  Cardiovascular: Normal rate, regular rhythm and normal heart sounds.  Exam reveals no gallop and no friction rub.   No murmur heard. Pulmonary/Chest: Effort normal and breath sounds normal. No respiratory distress. She has no wheezes. She has no rhonchi. She has no rales. She exhibits no tenderness and no crepitus.  Abdominal: Soft. Normal  appearance and bowel sounds are normal. She exhibits no distension. There is no tenderness. There is no rebound and no guarding.  Musculoskeletal: Normal range of motion. She exhibits no edema or tenderness.  Moves all extremities well. She has possibly a small effusion on her left knee, however there is no redness or warmth to the knee. The joints are little bit and large consistent with some underlying arthritis. There is no obvious swelling of her ankles or feet. She is noted to have multiple hammertoes. There is no redness of her skin in her feet or lower extremities. There is no lesion seen. Her ankle joints and in TP joints or toes are not swollen. There is no edema.  She has some mild enlargement of the joints of her fingers consistent with some osteoarthritis.  Neurological: She is alert and oriented to person, place, and time. She has normal strength. No cranial nerve deficit.  Skin: Skin is warm, dry and intact. No rash noted. No erythema. No pallor.  Psychiatric: Her speech is normal and behavior is normal.  Tearful when talking about her friend in the ICU  Nursing note and vitals reviewed.    ED Treatments / Results  Labs (all labs ordered are listed, but only abnormal results are displayed) Labs Reviewed  CBG MONITORING, ED - Abnormal; Notable for the following:       Result Value   Glucose-Capillary 102 (*)    All other components within normal limits    EKG  EKG Interpretation None       Radiology No results found.  Procedures Procedures (including critical care time)  Medications Ordered in ED Medications  dexamethasone (DECADRON) injection 6 mg (6 mg Intramuscular Given 04/26/17 0631)     Initial Impression / Assessment and Plan / ED Course  I have reviewed the triage vital signs and the nursing notes.  Pertinent labs & imaging results that were available during my care of the patient were reviewed by me and considered in my medical decision making (see chart  for details).     Patient has diffuse joint and body pain. Her CBG was good at 102. She was given Decadron IM to see if that will help of her pain. Patient does not have a history of rheumatoid arthritis.  Recheck at 7:15 AM patient states her pain is improving. She feels ready to go home. I will going to send her home with low-dose prednisone for her arthralgias. We have been having a lot of thunderstorms lately which may be making her arthritis flareup..  Final Clinical Impressions(s) / ED Diagnoses   Final diagnoses:  Arthralgia, unspecified joint    New Prescriptions New Prescriptions   PREDNISONE (DELTASONE) 20 MG TABLET    Take 1 tablet (20 mg total) by mouth daily with breakfast.   Plan discharge  Devoria Albe, MD, Concha Pyo, MD 04/26/17 [phone]

## 2017-07-20 ENCOUNTER — Emergency Department (HOSPITAL_COMMUNITY): Payer: Medicare Other

## 2017-07-20 ENCOUNTER — Encounter (HOSPITAL_COMMUNITY): Payer: Self-pay | Admitting: Emergency Medicine

## 2017-07-20 ENCOUNTER — Observation Stay (HOSPITAL_COMMUNITY)
Admission: EM | Admit: 2017-07-20 | Discharge: 2017-07-22 | Disposition: A | Discharge status: Home / Self Care | Payer: Medicare Other | Attending: Internal Medicine | Admitting: Internal Medicine

## 2017-07-20 DIAGNOSIS — N183 Chronic kidney disease, stage 3 (moderate): Secondary | ICD-10-CM | POA: Diagnosis not present

## 2017-07-20 DIAGNOSIS — J189 Pneumonia, unspecified organism: Secondary | ICD-10-CM

## 2017-07-20 DIAGNOSIS — E785 Hyperlipidemia, unspecified: Secondary | ICD-10-CM | POA: Diagnosis not present

## 2017-07-20 DIAGNOSIS — I10 Essential (primary) hypertension: Secondary | ICD-10-CM | POA: Diagnosis present

## 2017-07-20 DIAGNOSIS — I130 Hypertensive heart and chronic kidney disease with heart failure and stage 1 through stage 4 chronic kidney disease, or unspecified chronic kidney disease: Secondary | ICD-10-CM | POA: Diagnosis not present

## 2017-07-20 DIAGNOSIS — R079 Chest pain, unspecified: Secondary | ICD-10-CM | POA: Diagnosis present

## 2017-07-20 DIAGNOSIS — I471 Supraventricular tachycardia: Secondary | ICD-10-CM | POA: Diagnosis not present

## 2017-07-20 DIAGNOSIS — Z79899 Other long term (current) drug therapy: Secondary | ICD-10-CM | POA: Insufficient documentation

## 2017-07-20 DIAGNOSIS — I2510 Atherosclerotic heart disease of native coronary artery without angina pectoris: Secondary | ICD-10-CM | POA: Diagnosis not present

## 2017-07-20 DIAGNOSIS — Z23 Encounter for immunization: Secondary | ICD-10-CM | POA: Insufficient documentation

## 2017-07-20 DIAGNOSIS — I5032 Chronic diastolic (congestive) heart failure: Secondary | ICD-10-CM | POA: Diagnosis not present

## 2017-07-20 DIAGNOSIS — J810 Acute pulmonary edema: Secondary | ICD-10-CM | POA: Insufficient documentation

## 2017-07-20 DIAGNOSIS — R072 Precordial pain: Secondary | ICD-10-CM

## 2017-07-20 DIAGNOSIS — E1122 Type 2 diabetes mellitus with diabetic chronic kidney disease: Secondary | ICD-10-CM | POA: Insufficient documentation

## 2017-07-20 DIAGNOSIS — E119 Type 2 diabetes mellitus without complications: Secondary | ICD-10-CM | POA: Diagnosis not present

## 2017-07-20 LAB — BASIC METABOLIC PANEL
ANION GAP: 7 (ref 5–15)
BUN: 27 mg/dL — AB (ref 6–20)
CALCIUM: 9.2 mg/dL (ref 8.9–10.3)
CO2: 24 mmol/L (ref 22–32)
Chloride: 108 mmol/L (ref 101–111)
Creatinine, Ser: 1.16 mg/dL — ABNORMAL HIGH (ref 0.44–1.00)
GFR calc Af Amer: 46 mL/min — ABNORMAL LOW (ref >60)
GFR, EST NON AFRICAN AMERICAN: 40 mL/min — AB (ref >60)
GLUCOSE: 88 mg/dL (ref 65–99)
POTASSIUM: 4.5 mmol/L (ref 3.5–5.1)
Sodium: 139 mmol/L (ref 135–145)

## 2017-07-20 LAB — TROPONIN I
Troponin I: 0.03 ng/mL (ref <0.03)
Troponin I: 0.04 ng/mL (ref <0.03)
Troponin I: 0.04 ng/mL (ref <0.03)
Troponin I: 0.04 ng/mL (ref <0.03)

## 2017-07-20 LAB — CBC WITH DIFFERENTIAL/PLATELET
BASOS ABS: 0 10*3/uL (ref 0.0–0.1)
Basophils Relative: 0 %
EOS PCT: 2 %
Eosinophils Absolute: 0.1 10*3/uL (ref 0.0–0.7)
HEMATOCRIT: 35.4 % — AB (ref 36.0–46.0)
Hemoglobin: 11.2 g/dL — ABNORMAL LOW (ref 12.0–15.0)
LYMPHS ABS: 1 10*3/uL (ref 0.7–4.0)
LYMPHS PCT: 21 %
MCH: 29.6 pg (ref 26.0–34.0)
MCHC: 31.6 g/dL (ref 30.0–36.0)
MCV: 93.7 fL (ref 78.0–100.0)
MONO ABS: 0.4 10*3/uL (ref 0.1–1.0)
Monocytes Relative: 7 %
NEUTROS ABS: 3.5 10*3/uL (ref 1.7–7.7)
Neutrophils Relative %: 70 %
PLATELETS: 160 10*3/uL (ref 150–400)
RBC: 3.78 MIL/uL — ABNORMAL LOW (ref 3.87–5.11)
RDW: 14.4 % (ref 11.5–15.5)
WBC: 5.1 10*3/uL (ref 4.0–10.5)

## 2017-07-20 LAB — TSH: TSH: 0.623 u[IU]/mL (ref 0.350–4.500)

## 2017-07-20 MED ORDER — ENOXAPARIN SODIUM 30 MG/0.3ML ~~LOC~~ SOLN
30.0000 mg | SUBCUTANEOUS | Status: DC
Start: 2017-07-20 — End: 2017-07-22
  Administered 2017-07-20 – 2017-07-21 (×2): 30 mg via SUBCUTANEOUS
  Filled 2017-07-20 (×2): qty 0.3

## 2017-07-20 MED ORDER — MORPHINE SULFATE (PF) 2 MG/ML IV SOLN: 2.0000 mg | INTRAVENOUS | Status: DC | PRN

## 2017-07-20 MED ORDER — VANCOMYCIN HCL IN DEXTROSE 750-5 MG/150ML-% IV SOLN: 750.0000 mg | INTRAVENOUS | Status: DC

## 2017-07-20 MED ORDER — METOPROLOL SUCCINATE ER 25 MG PO TB24
25.0000 mg | ORAL_TABLET | Freq: Every day | ORAL | Status: DC
  Administered 2017-07-21 – 2017-07-22 (×2): 25 mg via ORAL
  Filled 2017-07-20 (×2): qty 1

## 2017-07-20 MED ORDER — ISOSORBIDE MONONITRATE ER 60 MG PO TB24
30.0000 mg | ORAL_TABLET | Freq: Every day | ORAL | Status: DC
  Administered 2017-07-21: 30 mg via ORAL
  Filled 2017-07-20 (×2): qty 1

## 2017-07-20 MED ORDER — ASPIRIN 81 MG PO CHEW
324.0000 mg | CHEWABLE_TABLET | Freq: Once | ORAL | Status: AC
  Administered 2017-07-20: 324 mg via ORAL
  Filled 2017-07-20: qty 4

## 2017-07-20 MED ORDER — INFLUENZA VAC SPLIT HIGH-DOSE 0.5 ML IM SUSY
0.5000 mL | PREFILLED_SYRINGE | INTRAMUSCULAR | Status: AC
  Administered 2017-07-21: 0.5 mL via INTRAMUSCULAR
  Filled 2017-07-20: qty 0.5

## 2017-07-20 MED ORDER — DEXTROSE 5 % IV SOLN: 1.0000 g | INTRAVENOUS | Status: DC

## 2017-07-20 MED ORDER — ATORVASTATIN CALCIUM 40 MG PO TABS
40.0000 mg | ORAL_TABLET | Freq: Every day | ORAL | Status: DC
  Administered 2017-07-21 – 2017-07-22 (×2): 40 mg via ORAL
  Filled 2017-07-20 (×2): qty 1

## 2017-07-20 MED ORDER — DEXTROSE 5 % IV SOLN
1.0000 g | Freq: Once | INTRAVENOUS | Status: AC
  Administered 2017-07-20: 1 g via INTRAVENOUS
  Filled 2017-07-20: qty 1

## 2017-07-20 MED ORDER — ONDANSETRON HCL 4 MG/2ML IJ SOLN: 4.0000 mg | Freq: Four times a day (QID) | INTRAMUSCULAR | Status: DC | PRN

## 2017-07-20 MED ORDER — VANCOMYCIN HCL IN DEXTROSE 1-5 GM/200ML-% IV SOLN
1000.0000 mg | Freq: Once | INTRAVENOUS | Status: AC
  Administered 2017-07-20: 1000 mg via INTRAVENOUS
  Filled 2017-07-20: qty 200

## 2017-07-20 MED ORDER — ASPIRIN EC 81 MG PO TBEC
81.0000 mg | DELAYED_RELEASE_TABLET | Freq: Every day | ORAL | Status: DC
  Administered 2017-07-20 – 2017-07-22 (×3): 81 mg via ORAL
  Filled 2017-07-20 (×3): qty 1

## 2017-07-20 MED ORDER — ACETAMINOPHEN 325 MG PO TABS: 650.0000 mg | ORAL_TABLET | ORAL | Status: DC | PRN

## 2017-07-20 MED ORDER — GABAPENTIN 100 MG PO CAPS
100.0000 mg | ORAL_CAPSULE | Freq: Two times a day (BID) | ORAL | Status: DC
  Administered 2017-07-20 – 2017-07-21 (×3): 100 mg via ORAL
  Filled 2017-07-20 (×3): qty 1

## 2017-07-20 MED ORDER — GI COCKTAIL ~~LOC~~: 30.0000 mL | Freq: Four times a day (QID) | ORAL | Status: DC | PRN

## 2017-07-20 NOTE — ED Provider Notes (Signed)
[facility]GENCY [facility]PART[facility] Provider Note   CSN: [id number] Arrival date & time: 07/20/17  [id number]     History   Chief Complaint Chief Complaint  Patient presents with  . Chest Pain    HPI Kirt BoysLuster W Stratmann is a 83 y.o. female.  Patient lives at home by herself.  Patient brought in by ambulance.  Patient has a power of attorney that lives locally.  Patient with onset of substernal chest pain sometime during the night before the son was up.  This is why she called EMS.  Patient states is still present but it has improved significantly.  Patient without any other complaints.  Denies any shortness of breath.  Nausea or vomiting.      Past Medical History:  Diagnosis Date  . Arthritis   . Coronary artery disease    Cath 2004--60% LAD--diffuse and calcified, 40% PDA  . Depression   . Hyperlipidemia   . Hypertension   . Nonischemic cardiomyopathy (HCC)    EF 30% 2004, last echo 10/2006 EF=60-65%  . Overactive bladder   . Pulmonary embolism (HCC)    status post  . Type II diabetes mellitus [facility](HCC)     Patient Active Problem List   Diagnosis Date Noted  . Pain in the chest 06/15/2016  . Type II diabetes mellitus (HCC)   . Chest pain 02/08/2012  . PE 10/01/2009  . Cardiomyopathy (HCC) 10/01/2009  . WALKING DIFFICULTY 10/01/2009  . DYSTHYMIC DISORDER 07/06/2009  . UNSPECIFIED CHRONIC ISCHEMIC HEART DISEASE 07/06/2009  . Left bundle branch block (LBBB) 07/06/2009  . Coronary atherosclerosis 12/12/2007  . BACK PAIN, THORACIC REGION, CHRONIC 12/12/2007  . HYPERLIPIDEMIA 09/11/2007  . CATARACTS 09/11/2007  . Essential hypertension 09/11/2007  . OVERACTIVE BLADDER 09/11/2007  . ARTHRITIS 09/11/2007    Past Surgical History:  Procedure Laterality Date  . APPENDECTOMY  1959  . BACK SURGERY  1967; 1977  . CATARACT EXTRACTION W/ INTRAOCULAR LENS  IMPLANT, BILATERAL  2008   right eye  . CHOLECYSTECTOMY  1959  . TONSILLECTOMY      OB History    No data available        Home Medications    Prior to Admission medications   Medication Sig Start Date End Date Taking? Authorizing Provider  atorvastatin (LIPITOR) 40 MG tablet Take 1 tablet by mouth daily. 09/22/16 09/22/17 Yes [provider]  furosemide (LASIX) 40 MG tablet Take 1 tablet by mouth daily as needed for fluid.  09/23/16 09/23/17 Yes [provider]  gabapentin (NEURONTIN) 100 MG capsule Take 100 mg by mouth 2 (two) times daily.   Yes [provider]  isosorbide mononitrate (IMDUR) 30 MG 24 hr tablet Take 1 tablet by mouth daily. 09/22/16 09/22/17 Yes [provider]  metoprolol succinate (TOPROL-XL) 25 MG 24 hr tablet Take 1 tablet by mouth daily. 09/23/16 09/23/17 Yes [provider]  nitroGLYCERIN (NITROSTAT) 0.4 MG SL tablet Place 0.4 mg under the tongue every 5 (five) minutes as needed.   Yes [provider]    Family History Family History  Problem Relation Age of Onset  . Stroke Mother        deceased    Social History Social History  Substance Use Topics  . Smoking status: Never Smoker  . Smokeless tobacco: Never Used  . Alcohol use No     Allergies   Patient has no known allergies.   Review of Systems Review of Systems  Constitutional: Positive for chills. Negative for fever.  HENT: Negative for congestion.   Eyes: Negative for visual disturbance.  Respiratory: Negative for shortness of breath.   Cardiovascular: Positive for chest pain.  Gastrointestinal: Negative for abdominal pain, nausea and vomiting.  Genitourinary: Negative for dysuria.  Musculoskeletal: Negative for back pain.  Neurological: Negative for headaches.  Hematological: Does not bruise/bleed easily.  Psychiatric/Behavioral: Negative for confusion.     Physical Exam Updated Vital Signs BP 114/67   Pulse 88   Temp 97.9 F (36.6 C) (Oral)   Resp 18   Ht 1.651 m (5\' 5" )   Wt 52.6 kg (116 lb)   SpO2 98%   BMI 19.30 kg/m   Physical  Exam  Constitutional: She is oriented to person, place, and time. She appears well-developed and well-nourished. No distress.  HENT:  Head: Normocephalic and atraumatic.  Mouth/Throat: Oropharynx is clear and moist.  Eyes: Pupils are equal, round, and reactive to light. Conjunctivae and EOM are normal.  Neck: Normal range of motion. Neck supple.  Cardiovascular: Normal rate and regular rhythm.   Pulmonary/Chest: Effort normal and breath sounds normal. No respiratory distress.  Abdominal: Soft. Bowel sounds are normal. There is no tenderness.  Musculoskeletal: Normal range of motion.  Neurological: She is alert and oriented to person, place, and time. No cranial nerve deficit or sensory deficit. She exhibits normal muscle tone. Coordination normal.  Skin: Skin is warm.  Nursing note and vitals reviewed.    ED Treatments / Results  Labs (all labs ordered are listed, but only abnormal results are displayed) Labs Reviewed  CBC WITH DIFFERENTIAL/PLATELET - Abnormal; Notable for the following:       Result Value   RBC 3.78 (*)    Hemoglobin 11.2 (*)    HCT 35.4 (*)    All other components within normal limits  BASIC METABOLIC PANEL - Abnormal; Notable for the following:    BUN 27 (*)    Creatinine, Ser 1.16 (*)    GFR calc non Af Amer 40 (*)    GFR calc Af Amer 46 (*)    All other components within normal limits  TROPONIN I - Abnormal; Notable for the following:    Troponin I 0.03 (*)    All other components within normal limits  TROPONIN I - Abnormal; Notable for the following:    Troponin I 0.04 (*)    All other components within normal limits    EKG  EKG Interpretation  Date/Time:  Thursday July 20 2017 09:06:21 EDT Ventricular Rate:  90 PR Interval:    QRS Duration: 146 QT Interval:  402 QTC Calculation: 492 R Axis:   15 Text Interpretation:  Sinus rhythm Left bundle branch block No significant change since last tracing Confirmed by Vanetta Mulders [phone]) on  07/20/2017 9:14:33 AM       Radiology Ct Chest Wo Contrast  Result Date: 07/20/2017 CLINICAL DATA:  Centralized chest pain after walking EXAM: CT CHEST WITHOUT CONTRAST TECHNIQUE: Multidetector CT imaging of the chest was performed following the standard protocol without IV contrast. COMPARISON:  Chest radiograph 07/20/2017 FINDINGS: Cardiovascular: Coronary artery calcification and aortic atherosclerotic calcification. Heart is enlarged. No pericardial effusion Mediastinum/Nodes: No axillary or supraclavicular adenopathy. No mediastinal hilar adenopathy. Lungs/Pleura: Scattered ground-glass nodules in the RIGHT upper lobe (image 56, series 4, for example). Bibasilar bronchial wall thickening. Small bilateral pleural effusions. No suspicious nodularity. Upper Abdomen: Limited view of the liver, , pancreas are unremarkable. Atrophic LEFT kidney. Benign calcifications of the adrenal gland suggest prior infection or hemorrhage  Musculoskeletal: No aggressive osseous lesions IMPRESSION: 1. Patchy ground-glass nodules in the RIGHT upper lobe suggests mild pulmonary infection / pneumonia. 2. Bibasilar bronchial thickening consistent with bronchitis. 3. Extensive coronary artery calcification. 4. Cardiomegaly. 5. Small bilateral pleural effusions. Electronically Signed   By: Genevive Bi M.D.   On: 07/20/2017 13:42   Dg Chest Port 1 View  Result Date: 07/20/2017 CLINICAL DATA:  Chest pain. EXAM: PORTABLE CHEST 1 VIEW COMPARISON:  Chest x-ray dated April 18, 2017. FINDINGS: Mild cardiomegaly, unchanged. Increased pulmonary vascularity and interstitial markings. Patchy density in the left lower lobe. Small left pleural effusion. No pneumothorax. No acute osseous abnormality. IMPRESSION: 1. Mild cardiomegaly with increased pulmonary vascular congestion and interstitial edema. Small left pleural effusion. 2. Patchy opacities in the left lower lobe, likely atelectasis, although early pneumonia could have a  similar appearance. Electronically Signed   By: Obie Dredge M.D.   On: 07/20/2017 09:47    Procedures Procedures (including critical care time)  Medications Ordered in ED Medications  ceFEPIme (MAXIPIME) 1 g in dextrose 5 % 50 mL IVPB (1 g Intravenous New Bag/Given 07/20/17 1504)  vancomycin (VANCOCIN) IVPB 1000 mg/200 mL premix (not administered)     Initial Impression / Assessment and Plan / ED Course  I have reviewed the triage vital signs and the nursing notes.  Pertinent labs & imaging results that were available during my care of the patient were reviewed by me and considered in my medical decision making (see chart for details).     Workup for the chest pain included chest x-ray which raised some concern for pneumonia.  Patient's initial troponin was borderline elevated but her last 5 have been the same 0.03.  Based on this a repeat 3-hour troponin was ordered.  That came back at 0.04.  So slightly increased.  Patient without any real significant chest pain currently.  Patient will be given aspirin.  Based on the chest x-ray CT of chest was ordered which showed evidence of pneumonia.  Patient has been in a nursing home recently so treating this as healthcare acquired.  Location of the pneumonia will be right upper lobe.  Based on the slightly increased troponin which is going up and the right upper lobe pneumonia and her age although no hypoxia no acute distress.  With hospitalist regarding admission for further troponins and to make sure that she gets better not worse regarding the pneumonia.  Final Clinical Impressions(s) / ED Diagnoses   Final diagnoses:  HCAP (healthcare-associated pneumonia)  Precordial pain    New Prescriptions New Prescriptions   No medications on file     Vanetta Mulders, MD 07/20/17 1517

## 2017-07-20 NOTE — ED Notes (Signed)
Date and time results received: 07/20/17 11:02 AM  (use smartphrase ".now" to insert current time)  Test: Troponin Critical Value: 0.03  Name of Provider Notified: Zackowski  Orders Received? Or Actions Taken?: Orders Received - See Orders for details

## 2017-07-20 NOTE — H&P (Signed)
History and Physical    [name] [name] RSW:[id number] DOB: 03-20-1924 DOA: 07/20/2017  PCP: Kirstie Peri, MD Consultants:  None Patient coming from:  Home - lives alone; NOK: Sandi Carne, friend, [phone]  Chief Complaint:  Chest pain  HPI: [name] [name] is a 83 y.o. female with medical history significant of DM; HTN; HLD; non-obstructive CAD; and depression presenting with chest pain.  She reports that she  eats breakfast quite early (3-5AM).  She got up to make breaskfast but decided not to eat.  She was going back to bed and developed a sharp left-sided chest pain. She called her POA and he suggested she call 911.  Substernal/left-sided.  It did not last long.  No associated symptoms.  Did not recur.  She feels fine now.  She was recently a resident at [facility].  She was discharged about a month ago.     ED Course: Chest pain evaluation included a CXR which raised concern for PNA.  Chest CT shows RUL PNA.  Since patient was a nursing home resident until approximately 1 month ago, she was started on Vanc/Zosyn for HCAP.  Troponin 0.03, 0.04.  Suggest tele obs.  Review of Systems: As per HPI; otherwise review of systems reviewed and negative.   Ambulatory Status:  Ambulates with a walker  Past Medical History:  Diagnosis Date  . Arthritis   . Coronary artery disease    Cath 2004--60% LAD--diffuse and calcified, 40% PDA  . Depression   . Hyperlipidemia   . Hypertension   . Nonischemic cardiomyopathy (HCC)    EF 30% 2004, last echo 10/2006 EF=60-65%  . Overactive bladder   . Pulmonary embolism (HCC)    status post  . Type II diabetes mellitus (HCC)     Past Surgical History:  Procedure Laterality Date  . APPENDECTOMY  1959  . BACK SURGERY  1967; 1977  . CATARACT EXTRACTION W/ INTRAOCULAR LENS  IMPLANT, BILATERAL  2008   right eye  . CHOLECYSTECTOMY  1959  . TONSILLECTOMY      Social History   Social History  . Marital status: Widowed    Spouse name: N/A    . Number of children: 2  . Years of education: N/A   Occupational History  . retired    Social History Main Topics  . Smoking status: Never Smoker  . Smokeless tobacco: Never Used  . Alcohol use No  . Drug use: No  . Sexual activity: No   Other Topics Concern  . Not on file   Social History Narrative  . No narrative on file    No Known Allergies  Family History  Problem Relation Age of Onset  . Stroke Mother        deceased    Prior to Admission medications   Medication Sig Start Date End Date Taking? Authorizing Provider  atorvastatin (LIPITOR) 40 MG tablet Take 1 tablet by mouth daily. 09/22/16 09/22/17 Yes [provider]  furosemide (LASIX) 40 MG tablet Take 1 tablet by mouth daily as needed for fluid.  09/23/16 09/23/17 Yes [provider]  gabapentin (NEURONTIN) 100 MG capsule Take 100 mg by mouth 2 (two) times daily.   Yes [provider]  isosorbide mononitrate (IMDUR) 30 MG 24 hr tablet Take 1 tablet by mouth daily. 09/22/16 09/22/17 Yes [provider]  metoprolol succinate (TOPROL-XL) 25 MG 24 hr tablet Take 1 tablet by mouth daily. 09/23/16 09/23/17 Yes [provider]  nitroGLYCERIN (NITROSTAT) 0.4 MG  SL tablet Place 0.4 mg under the tongue every 5 (five) minutes as needed.   Yes [provider]    Physical Exam: Vitals:   07/20/17 1545 07/20/17 1600 07/20/17 1611 07/20/17 1641  BP:  123/71 123/71 (!) 109/59  Pulse:   (!) 102 95  Resp: 20 (!) 29 18 18   Temp:    98.4 F (36.9 C)  TempSrc:    Oral  SpO2:   98% 99%  Weight:    48.7 kg (107 lb 5.8 oz)  Height:         General:  Appears calm and comfortable and is NAD; she is very conversant and happy to have someone to talk to Eyes:  PERRL, EOMI, normal lids, iris ENT:  grossly normal hearing, lips & tongue, mmm; edentulous Neck:  no LAD, masses or thyromegaly; no carotid bruits Cardiovascular:  RRR, no m/r/g. No LE edema.  Respiratory:   CTA  bilaterally with no wheezes/rales/rhonchi.  Normal respiratory effort. Abdomen:  soft, NT, ND, NABS Back:   normal alignment, no CVAT Skin:  no rash or induration seen on limited exam Musculoskeletal:  grossly normal tone BUE/BLE, good ROM, no bony abnormality Lower extremity:  No LE edema.  Limited foot exam with no ulcerations.  2+ distal pulses. Psychiatric:  grossly normal mood and affect, speech fluent and appropriate, AOx3 Neurologic:  CN 2-12 grossly intact, moves all extremities in coordinated fashion, sensation intact    Radiological Exams on Admission: Ct Chest Wo Contrast  Result Date: 07/20/2017 CLINICAL DATA:  Centralized chest pain after walking EXAM: CT CHEST WITHOUT CONTRAST TECHNIQUE: Multidetector CT imaging of the chest was performed following the standard protocol without IV contrast. COMPARISON:  Chest radiograph 07/20/2017 FINDINGS: Cardiovascular: Coronary artery calcification and aortic atherosclerotic calcification. Heart is enlarged. No pericardial effusion Mediastinum/Nodes: No axillary or supraclavicular adenopathy. No mediastinal hilar adenopathy. Lungs/Pleura: Scattered ground-glass nodules in the RIGHT upper lobe (image 56, series 4, for example). Bibasilar bronchial wall thickening. Small bilateral pleural effusions. No suspicious nodularity. Upper Abdomen: Limited view of the liver, , pancreas are unremarkable. Atrophic LEFT kidney. Benign calcifications of the adrenal gland suggest prior infection or hemorrhage Musculoskeletal: No aggressive osseous lesions IMPRESSION: 1. Patchy ground-glass nodules in the RIGHT upper lobe suggests mild pulmonary infection / pneumonia. 2. Bibasilar bronchial thickening consistent with bronchitis. 3. Extensive coronary artery calcification. 4. Cardiomegaly. 5. Small bilateral pleural effusions. Electronically Signed   By: Genevive Bi M.D.   On: 07/20/2017 13:42   Dg Chest Port 1 View  Result Date: 07/20/2017 CLINICAL DATA:   Chest pain. EXAM: PORTABLE CHEST 1 VIEW COMPARISON:  Chest x-ray dated April 18, 2017. FINDINGS: Mild cardiomegaly, unchanged. Increased pulmonary vascularity and interstitial markings. Patchy density in the left lower lobe. Small left pleural effusion. No pneumothorax. No acute osseous abnormality. IMPRESSION: 1. Mild cardiomegaly with increased pulmonary vascular congestion and interstitial edema. Small left pleural effusion. 2. Patchy opacities in the left lower lobe, likely atelectasis, although early pneumonia could have a similar appearance. Electronically Signed   By: Obie Dredge M.D.   On: 07/20/2017 09:47    EKG: Independently reviewed.  NSR with rate 90; LBBB, NSCSLT  Labs on Admission: I have personally reviewed the available labs and imaging studies at the time of the admission.  Pertinent labs:   BUN 27/Creatinine 1.16/GFR 40 Troponin 0.03, 0.04, 0.04 WBC 5.1 Hgb 11.2   Assessment/Plan Principal Problem:   Chest pain Active Problems:   Hyperlipidemia   Essential hypertension  Type II diabetes mellitus (HCC)   Chest pain -Patient with acute onset of sharp substernal chest pain that may have been exertional but resolved spontaneously and has not recurred. -1/3 typical symptoms suggestive of noncardiac chest pain.  -CXR unremarkable.   -Initial cardiac troponin borderline but flat.   This may be reflective of demand ischemia but also could reflect her CKD. -EKG not indicative of acute ischemia.   -GRACE score is 165; which predicts an in-hospital death rate of 6.4%.  -Will plan to place in observation status on telemetry to rule out ACS by overnight observation.  -cycle troponin q6h x 3 and repeat EKG in AM -Start ASA 81 mg  Daily; continue Imdur -morphine given -Risk factor stratification with FLP; will also check TSH -With single episode of chest pain and this history, suspicious for ACS is low.  Will defer decision about cardiology consultation to day  team.  HTN -Takes low-dose Toprol XL monotherapy at home -Patient with good or too good control while in the ER -Will monitor, as she may need to come off this medication if her BP is low  HLD -Continue Lipitor -Lipids were checked in 9/17 (TC 174, HDL 47, LDL 105, TG 108)  -Will recheck  DM -Diet controlled -Last A1c was 5.5 -There is no indication to start medication at this time and will not cover with SSI for now   NOTE: There was concern in the ER for PNA based on CXR and chest CT.  The patient has no symptoms of PNA and no evidence on vitals or physical exam.  My review of CXR and CT indicates a discordance in possible location of PNA and it is likely an over-call.  She was given Vanc/Zosyn x 1 in the ER.  Will not continue to treat at this time, unless she develops symptoms.   DVT prophylaxis:  Lovenox  Code Status: Full - confirmed with patient.  We thoroughly discussed this issue and I strongly encouraged her to reconsider this and discuss it with her POA. Family Communication: None present  Disposition Plan:  Home once clinically improved Consults called: None  Admission status: It is my clinical opinion that referral for OBSERVATION is reasonable and necessary in this patient based on the above information provided. The aforementioned taken together are felt to place the patient at high risk for further clinical deterioration. However it is anticipated that the patient may be medically stable for discharge from the hospital within 24 to 48 hours.    Jonah BlueJennifer Prue Lingenfelter MD [facility]  If note is complete, please contact covering daytime or nighttime physician. [email or web address] Password [email or web address]  07/20/2017, 7:29 PM

## 2017-07-20 NOTE — Progress Notes (Signed)
Pharmacy Antibiotic Note  [name] [name] is a 83 y.o. female admitted on 07/20/2017 with pneumonia.  Pharmacy has been consulted for vancomycin dosing.  Plan: Vancomycin 1gm IV X 1 then 750 mg IV q48 hours f/u renal function cultures and clinical course  Height: 5\' 5"  (165.1 cm) Weight: 116 lb (52.6 kg) IBW/kg (Calculated) : 57  Temp (24hrs), Avg:97.9 F (36.6 C), Min:97.9 F (36.6 C), Max:97.9 F (36.6 C)   Recent Labs Lab 07/20/17 1021  WBC 5.1  CREATININE 1.16*    Estimated Creatinine Clearance: 26.2 mL/min (A) (by C-G formula based on SCr of 1.16 mg/dL (H)).    No Known Allergies   Thank you for allowing pharmacy to be a part of this patient's care.  Talbert Cage Poteet 07/20/2017 4:42 PM

## 2017-07-20 NOTE — ED Notes (Signed)
Pt requesting to leave AMA.  RN spoke with Whitney Muse (pt's POA). Mr. Julien Girt states he will not sign pt out AMA and she needs to stay for further evaluation.

## 2017-07-20 NOTE — ED Triage Notes (Signed)
Pt c/o central cp after waking this am. nad noted. CBG-112.

## 2017-07-21 DIAGNOSIS — E785 Hyperlipidemia, unspecified: Secondary | ICD-10-CM | POA: Diagnosis not present

## 2017-07-21 DIAGNOSIS — R079 Chest pain, unspecified: Secondary | ICD-10-CM | POA: Diagnosis not present

## 2017-07-21 DIAGNOSIS — I10 Essential (primary) hypertension: Secondary | ICD-10-CM | POA: Diagnosis not present

## 2017-07-21 DIAGNOSIS — I471 Supraventricular tachycardia: Secondary | ICD-10-CM | POA: Diagnosis not present

## 2017-07-21 DIAGNOSIS — E119 Type 2 diabetes mellitus without complications: Secondary | ICD-10-CM | POA: Diagnosis not present

## 2017-07-21 DIAGNOSIS — I5023 Acute on chronic systolic (congestive) heart failure: Secondary | ICD-10-CM | POA: Diagnosis not present

## 2017-07-21 LAB — LIPID PANEL
CHOL/HDL RATIO: 3.3 ratio
CHOLESTEROL: 141 mg/dL (ref 0–200)
HDL: 43 mg/dL (ref >40)
LDL CALC: 76 mg/dL (ref 0–99)
Triglycerides: 112 mg/dL (ref <150)
VLDL: 22 mg/dL (ref 0–40)

## 2017-07-21 LAB — STREP PNEUMONIAE URINARY ANTIGEN: Strep Pneumo Urinary Antigen: NEGATIVE

## 2017-07-21 LAB — TROPONIN I: Troponin I: 0.04 ng/mL (ref <0.03)

## 2017-07-21 LAB — MAGNESIUM: MAGNESIUM: 1.6 mg/dL — AB (ref 1.7–2.4)

## 2017-07-21 MED ORDER — FUROSEMIDE 10 MG/ML IJ SOLN
40.0000 mg | Freq: Every day | INTRAMUSCULAR | Status: DC
  Administered 2017-07-21: 40 mg via INTRAVENOUS
  Filled 2017-07-21 (×2): qty 4

## 2017-07-21 MED ORDER — MAGNESIUM SULFATE 2 GM/50ML IV SOLN
2.0000 g | Freq: Once | INTRAVENOUS | Status: AC
  Administered 2017-07-21: 2 g via INTRAVENOUS
  Filled 2017-07-21: qty 50

## 2017-07-21 NOTE — Progress Notes (Signed)
Pt had 14 beat run V-tach. Dr. Robb Matar paged and made aware. Waiting for orders, call back.

## 2017-07-21 NOTE — Progress Notes (Signed)
Dr. Robb Matar stated he was going to order a magnesium to previous labs d/t 14 beat run of Vtach

## 2017-07-21 NOTE — Care Management Obs Status (Signed)
MEDICARE OBSERVATION STATUS NOTIFICATION   Patient Details  Name: [name] [name] MRN: [id number] Date of Birth: 1924/03/20   Medicare Observation Status Notification Given:  Yes    Ralynn San, [name], RN 07/21/2017, 9:57 AM

## 2017-07-21 NOTE — Care Management Note (Signed)
Case Management Note  Patient Details  Name: [name] [name] MRN: [id number] Date of Birth: 12-06-1923  Subjective/Objective:     Adm with CP. From home alone, has an aide. Recent stay at [facility]. Not interested in [facility] again. She reports ind with ADL's, walks with a RW. Has a [name] (friend). She has Medicare and Medicaid (financial counselor called to add Medicaid to chart). Her aide is through [facility] care. She has friends  (mrs. [name]) who take her to out of town appointments, otherwise she rides [facility] or uses cabs to get to appointments.                Action/Plan: Anticipate DC home with self care. No CM needs known or communicated.    Expected Discharge Date:      07/21/2017          / Expected Discharge Plan:  Home/Self Care  In-House Referral:     Discharge planning Services  CM Consult  Post Acute Care Choice:  NA Choice offered to:  NA  DME Arranged:    DME Agency:     HH Arranged:    HH Agency:     Status of Service:  Completed, signed off  If discussed at [employer] of Stay Meetings, dates discussed:    Additional Comments:  [name], [name], RN 07/21/2017, 10:25 AM

## 2017-07-21 NOTE — Progress Notes (Signed)
PROGRESS NOTE  I saw and evaluated the patient, I personally obtain the key portions of the history and physical exam, I reviewed the physician assistant student documentation and agree with the physician assistant student medical decision-making. Further assessment and plan are as follows:  Please see attending's daily progress note.  Mauricio Arrien M.D.  Daine GravelLuster W ClarksvilleHousel RUE:[id number] DOB: March 19, 1924 DOA: 07/20/2017 PCP: Kirstie PeriShah, Ashish, MD   LOS: 0 days   Brief Narrative / Interim history: 83 year-old female with a significant history of CAD, heart failure with EF 25%, non-ischemic cardiomyopathy, and suspected cardioembolic stroke earlier this year. Early yesterday morning, pt was walking from the kitchen to her bedroom when she experienced a sharp substernal non-radiating pain in her chest that was not associated with dyspnea, n/v, diaphoresis, or dizziness/weakness. There was no cough, fever, or chills. The pain resolved spontaneously and completely in less than a minute. The pt called 911 at the urging of her healthcare POA and was [facility]. First troponin 0.03, followed by 4 at 0.04. EKG=NSR with LBBB unchanged from previous. There was some initial concern for PNA and pt received antibiotic coverage for HCAP. Pt was admitted for chest pain rule-out. During the night, telemetry showed an episode of what was originally reported to be VT but appears to have been AF with RVR vs. SVT. Cardiology consult currently pending.          Assessment & Plan: Principal Problem:   Chest pain Active Problems:   Hyperlipidemia   Essential hypertension   Type II diabetes mellitus (HCC)   Atypical chest pain Short duration, substernal location, and absence of associated symptoms mitigate against ACS. However, the pt's age and female gender in association with elevated troponin levels are concerning. The pt experienced a 32-beat run of an irregular narrow-complex tachycardia  overnight. Given her recent history of stroke, AF is of particular concern. Certainly, a paroxysm of AF might account for the patient's pain yesterday. [facility] Will obtain 2D echo and consult cardiology today. Continuing ASA and Imdur.  Hypomagnesemia Mg 1.6 this morning. 2g magnesium given IV. Will reassess mg in the morning.    Acute on chronic congestive heart failure with EF 25% and cardiogenic pulmonary edema CXR with pulmonary vascular congestion and small pleural effusions. No SOB, orthopnea, or PND. Continuing metoprolol. Pt on PRN Lasix at home. Will start Lasix 40 mg IV QD.   HLD Continuing atorvastatin.  Essential hypertension Stable. Continuing metoprolol.  Diabetes mellitus, type 2 Controlled. Pt not on insulin or oral hypoglycemic agent at home. Glucose 88 this morning. No treatment needed.   DVT prophylaxis: Lovenox Code Status: full code Family Communication: not at bedside Disposition Plan: home when medically clear  Consultants:   Cardiology  Procedures:   2D echo: pending   Antimicrobials:  1 g cefepime, 1 g vancomycin given yesterday in ED  Subjective: Pt states that she is feeling well and wants to go home. Denies CP, SOB, orthopnea, PND, and LE swelling.    Objective: Vitals:   07/20/17 2008 07/20/17 2012 07/20/17 2354 07/21/17 0407  BP: (!) 94/49 (!) 109/51 (!) 107/53 116/70  Pulse: 86  85 97  Resp: 18  18 18   Temp: 97.8 F (36.6 C)  97.9 F (36.6 C) 97.8 F (36.6 C)  TempSrc: Oral  Oral Oral  SpO2: 97%  98% 98%  Weight:      Height:        Intake/Output Summary (  Last 24 hours) at 07/21/17 1115 Last data filed at 07/21/17 0900  Gross per 24 hour  Intake              500 ml  Output              200 ml  Net              300 ml   Filed Weights   07/20/17 0858 07/20/17 1641  Weight: 52.6 kg (116 lb) 48.7 kg (107 lb 5.8 oz)    Examination:  Constitutional: non-distressed, somewhat cachetic  Eyes: lids and  conjunctivae normal ENMT: Mucous membranes are moist. Neck: normal, supple, no masses Respiratory: clear to auscultation bilaterally, no wheezing, no crackles. Normal respiratory effort. No accessory muscle use.  Cardiovascular: Regular rate and rhythm, no murmurs / rubs / gallops. No LE edema. 2+ pedal pulses.  Abdomen: no tenderness. Bowel sounds positive.  Musculoskeletal: no clubbing / cyanosis. No joint deformity upper and lower extremities. No contractures. Normal muscle tone.  Skin: no rashes, lesions, ulcers.  Neurologic: A&Ox4, grossly non-focal Psychiatric: Normal judgment and insight. Alert and oriented x 3. Normal mood.    Data Reviewed: I have independently reviewed following labs and imaging studies   CBC:  Recent Labs Lab 07/20/17 1021  WBC 5.1  NEUTROABS 3.5  HGB 11.2*  HCT 35.4*  MCV 93.7  PLT 160   Basic Metabolic Panel:  Recent Labs Lab 07/20/17 1021 07/21/17 0457  NA 139  --   K 4.5  --   CL 108  --   CO2 24  --   GLUCOSE 88  --   BUN 27*  --   CREATININE 1.16*  --   CALCIUM 9.2  --   MG  --  1.6*   GFR: Estimated Creatinine Clearance: 24.3 mL/min (A) (by C-G formula based on SCr of 1.16 mg/dL (H)). Liver Function Tests: No results for input(s): AST, ALT, ALKPHOS, BILITOT, PROT, ALBUMIN in the last 168 hours. No results for input(s): LIPASE, AMYLASE in the last 168 hours. No results for input(s): AMMONIA in the last 168 hours. Coagulation Profile: No results for input(s): INR, PROTIME in the last 168 hours. Cardiac Enzymes:  Recent Labs Lab 07/20/17 1021 07/20/17 1358 07/20/17 1725 07/20/17 2306 07/21/17 0457  TROPONINI 0.03* 0.04* 0.04* 0.04* 0.04*   BNP (last 3 results) No results for input(s): PROBNP in the last 8760 hours. HbA1C: No results for input(s): HGBA1C in the last 72 hours. CBG: No results for input(s): GLUCAP in the last 168 hours. Lipid Profile:  Recent Labs  07/21/17 0457  CHOL 141  HDL 43  LDLCALC 76  TRIG  272  CHOLHDL 3.3   Thyroid Function Tests:  Recent Labs  07/20/17 1728  TSH 0.623   Anemia Panel: No results for input(s): VITAMINB12, FOLATE, FERRITIN, TIBC, IRON, RETICCTPCT in the last 72 hours. Urine analysis:    Component Value Date/Time   COLORURINE YELLOW 12/30/2007 1850   APPEARANCEUR HAZY (A) 12/30/2007 1850   LABSPEC 1.020 12/30/2007 1850   PHURINE 6.5 12/30/2007 1850   GLUCOSEU NEGATIVE 12/30/2007 1850   HGBUR NEGATIVE 12/30/2007 1850   BILIRUBINUR NEGATIVE 12/30/2007 1850   KETONESUR NEGATIVE 12/30/2007 1850   PROTEINUR NEGATIVE 12/30/2007 1850   UROBILINOGEN 0.2 12/30/2007 1850   NITRITE POSITIVE (A) 12/30/2007 1850   LEUKOCYTESUR MODERATE (A) 12/30/2007 1850   Sepsis Labs: Invalid input(s): PROCALCITONIN, LACTICIDVEN  Recent Results (from the past 240 hour(s))  Culture, blood (routine x 2)  Call MD if unable to obtain prior to antibiotics being given     Status: None (Preliminary result)   Collection Time: 07/20/17  5:25 PM  Result Value Ref Range Status   Specimen Description BLOOD LEFT HAND  Final   Special Requests   Final    BOTTLES DRAWN AEROBIC AND ANAEROBIC Blood Culture results may not be optimal due to an inadequate volume of blood received in culture bottles   Culture NO GROWTH < 24 HOURS  Final   Report Status PENDING  Incomplete  Culture, blood (routine x 2) Call MD if unable to obtain prior to antibiotics being given     Status: None (Preliminary result)   Collection Time: 07/20/17  5:28 PM  Result Value Ref Range Status   Specimen Description RIGHT ANTECUBITAL  Final   Special Requests   Final    BOTTLES DRAWN AEROBIC AND ANAEROBIC Blood Culture results may not be optimal due to an inadequate volume of blood received in culture bottles   Culture NO GROWTH < 24 HOURS  Final   Report Status PENDING  Incomplete      Radiology Studies: Ct Chest Wo Contrast  Result Date: 07/20/2017 CLINICAL DATA:  Centralized chest pain after walking EXAM:  CT CHEST WITHOUT CONTRAST TECHNIQUE: Multidetector CT imaging of the chest was performed following the standard protocol without IV contrast. COMPARISON:  Chest radiograph 07/20/2017 FINDINGS: Cardiovascular: Coronary artery calcification and aortic atherosclerotic calcification. Heart is enlarged. No pericardial effusion Mediastinum/Nodes: No axillary or supraclavicular adenopathy. No mediastinal hilar adenopathy. Lungs/Pleura: Scattered ground-glass nodules in the RIGHT upper lobe (image 56, series 4, for example). Bibasilar bronchial wall thickening. Small bilateral pleural effusions. No suspicious nodularity. Upper Abdomen: Limited view of the liver, , pancreas are unremarkable. Atrophic LEFT kidney. Benign calcifications of the adrenal gland suggest prior infection or hemorrhage Musculoskeletal: No aggressive osseous lesions IMPRESSION: 1. Patchy ground-glass nodules in the RIGHT upper lobe suggests mild pulmonary infection / pneumonia. 2. Bibasilar bronchial thickening consistent with bronchitis. 3. Extensive coronary artery calcification. 4. Cardiomegaly. 5. Small bilateral pleural effusions. Electronically Signed   By: Genevive Bi M.D.   On: 07/20/2017 13:42   Dg Chest Port 1 View  Result Date: 07/20/2017 CLINICAL DATA:  Chest pain. EXAM: PORTABLE CHEST 1 VIEW COMPARISON:  Chest x-ray dated April 18, 2017. FINDINGS: Mild cardiomegaly, unchanged. Increased pulmonary vascularity and interstitial markings. Patchy density in the left lower lobe. Small left pleural effusion. No pneumothorax. No acute osseous abnormality. IMPRESSION: 1. Mild cardiomegaly with increased pulmonary vascular congestion and interstitial edema. Small left pleural effusion. 2. Patchy opacities in the left lower lobe, likely atelectasis, although early pneumonia could have a similar appearance. Electronically Signed   By: Obie Dredge M.D.   On: 07/20/2017 09:47     Scheduled Meds: . aspirin EC  81 mg Oral Daily  .  atorvastatin  40 mg Oral Daily  . enoxaparin (LOVENOX) injection  30 mg Subcutaneous Q24H  . gabapentin  100 mg Oral BID  . isosorbide mononitrate  30 mg Oral Daily  . metoprolol succinate  25 mg Oral Daily   Continuous Infusions: . magnesium sulfate 1 - 4 g bolus IVPB         Time spent: 15 minutes    Elinor Dodge, PA-S 07/21/2017, 11:15 AM   @[email or web address] @

## 2017-07-21 NOTE — Progress Notes (Signed)
PROGRESS NOTE    [name] [name]  GEX:[id number] DOB: 1923-09-30 DOA: 07/20/2017 PCP: Kirstie PeriShah, Ashish, MD    Brief Narrative:  83 year old female who presented with chest pain. Patient does have significant past medical history for diabetes mellitus type 2, hypertension, dyslipidemia, systolic heart failure ejection fraction 25%, and history of embolic stroke. Patient presents with acute precordial chest pain, it occurred while ambulating, and last for a few seconds, self resolve with no associated symptoms. Denies any palpitations, angina, PND, orthopnea or lower extremity edema. Takes furosemide as needed. On initial physical examination blood pressure 123/71, heart rate 95-102, respiratory rate 29, temperature 98.4, oxygen saturation 99%. Heart S1-S2 present and rhythmic, no gallops or murmurs, lungs were clear to auscultation bilaterally, no wheezing or rhonchi, abdomen soft nontender, no edema. Sodium 139, potassium 4.5, chloride 108, bicarbonate 24, glucose 88, BUN 27, creatinine 1.16, troponin 0.03, 0.04, white count 5.1, hemoglobin 9.2, hematocrit 35.4, platelets 160. Chest film with increase vascular congestion and small bilateral pleural effusions, CT chest with bilateral groundglass opacities, left lower lobe atelectasis, positive emphysematous changes and bronchiectasis. EKG sinus rhythm, left bundle-branch block, LVH with positive repolarization changes.   Patient was admitted to the hospital with the working diagnosis of acute chest pain complicated by elevated troponins, and cardiogenic pulmonary edema, systolic heart failure decompensation, acute on chronic.    Assessment & Plan:   Principal Problem:   Chest pain Active Problems:   Hyperlipidemia   Essential hypertension   Type II diabetes mellitus (HCC)   1. Supraventricular tachycardia to rule paroxysmal atrial fibrillation. Telemetry personally reviewed noted narrow tachycardia about 32 beats, irregular in nature, suggesting a  fib, but can not rule out SVT. Patient has low left ventricle ejection fraction and history of embolic CVA. Will continue telemetry monitoring, rate control with metoprolol, aspirin and will request a cardiology consultation.    2. Decompensated systolic heart failure. Patient know to have non ischemic cardiomyopathy, taking furosemide only as needed. Chest film with positive congestion, will resume IV furosemide and follow the urine output.   3. Cardiogenic pulmonary edema. Continue diuresis, oxymetry monitoring and supplemental oxygent per nasal cannula.   4. CKD stage 3 with hypomagnesemia. Will continue close follow up on renal function and electrolytes, patient deconditioned and elderly, likely serum creatinine does overestimates true GFR.   5. HTN. Will continue blood pressure monitoring. Will continue metoprolol and isosorbide.   6. History of embolic CVA. Will continue asa, and statin therapy.    DVT prophylaxis: enoxaparin  Code Status: full Family Communication: No family at the bedside Disposition Plan: Home   Consultants:   Cardiology  Procedures:     Antimicrobials:       Subjective: Patient feeling better, no nausea or vomiting, chest pain has not recurred. Had episode of tachycardia, no nausea or vomiting, no abdominal pain.   Objective: Vitals:   07/20/17 2008 07/20/17 2012 07/20/17 2354 07/21/17 0407  BP: (!) 94/49 (!) 109/51 (!) 107/53 116/70  Pulse: 86  85 97  Resp: 18  18 18   Temp: 97.8 F (36.6 C)  97.9 F (36.6 C) 97.8 F (36.6 C)  TempSrc: Oral  Oral Oral  SpO2: 97%  98% 98%  Weight:      Height:        Intake/Output Summary (Last 24 hours) at 07/21/17 1222 Last data filed at 07/21/17 0900  Gross per 24 hour  Intake              500 ml  Output              200 ml  Net              300 ml   Filed Weights   07/20/17 0858 07/20/17 1641  Weight: 52.6 kg (116 lb) 48.7 kg (107 lb 5.8 oz)    Examination:   General: deconditioned.    Neurology: Awake and alert, non focal  E ENT: mild pallor, no icterus, oral mucosa moist Cardiovascular: No JVD. S1-S2 present, rhythmic, no gallops, rubs, or murmurs. No lower extremity edema. Pulmonary: decreased breath sounds bilaterally, adequate air movement, no wheezing, rhonchi. Bibasilar rales. Gastrointestinal. Abdomen flat, no organomegaly, non tender, no rebound or guarding Skin. No rashes Musculoskeletal: no joint deformities     Data Reviewed: I have personally reviewed following labs and imaging studies  CBC:  Recent Labs Lab 07/20/17 1021  WBC 5.1  NEUTROABS 3.5  HGB 11.2*  HCT 35.4*  MCV 93.7  PLT 160   Basic Metabolic Panel:  Recent Labs Lab 07/20/17 1021 07/21/17 0457  NA 139  --   K 4.5  --   CL 108  --   CO2 24  --   GLUCOSE 88  --   BUN 27*  --   CREATININE 1.16*  --   CALCIUM 9.2  --   MG  --  1.6*   GFR: Estimated Creatinine Clearance: 24.3 mL/min (A) (by C-G formula based on SCr of 1.16 mg/dL (H)). Liver Function Tests: No results for input(s): AST, ALT, ALKPHOS, BILITOT, PROT, ALBUMIN in the last 168 hours. No results for input(s): LIPASE, AMYLASE in the last 168 hours. No results for input(s): AMMONIA in the last 168 hours. Coagulation Profile: No results for input(s): INR, PROTIME in the last 168 hours. Cardiac Enzymes:  Recent Labs Lab 07/20/17 1021 07/20/17 1358 07/20/17 1725 07/20/17 2306 07/21/17 0457  TROPONINI 0.03* 0.04* 0.04* 0.04* 0.04*   BNP (last 3 results) No results for input(s): PROBNP in the last 8760 hours. HbA1C: No results for input(s): HGBA1C in the last 72 hours. CBG: No results for input(s): GLUCAP in the last 168 hours. Lipid Profile:  Recent Labs  07/21/17 0457  CHOL 141  HDL 43  LDLCALC 76  TRIG 112  CHOLHDL 3.3   Thyroid Function Tests:  Recent Labs  07/20/17 1728  TSH 0.623   Anemia Panel: No results for input(s): VITAMINB12, FOLATE, FERRITIN, TIBC, IRON, RETICCTPCT in the last  72 hours.    Radiology Studies: I have reviewed all of the imaging during this hospital visit personally     Scheduled Meds: . aspirin EC  81 mg Oral Daily  . atorvastatin  40 mg Oral Daily  . enoxaparin (LOVENOX) injection  30 mg Subcutaneous Q24H  . gabapentin  100 mg Oral BID  . isosorbide mononitrate  30 mg Oral Daily  . metoprolol succinate  25 mg Oral Daily   Continuous Infusions: . magnesium sulfate 1 - 4 g bolus IVPB 2 g (07/21/17 1126)     LOS: 0 days        Taaj Hurlbut Annett Gula, MD [facility] Pager [phone]

## 2017-07-22 ENCOUNTER — Observation Stay (HOSPITAL_COMMUNITY): Payer: Medicare Other

## 2017-07-22 DIAGNOSIS — I10 Essential (primary) hypertension: Secondary | ICD-10-CM | POA: Diagnosis not present

## 2017-07-22 DIAGNOSIS — E785 Hyperlipidemia, unspecified: Secondary | ICD-10-CM | POA: Diagnosis not present

## 2017-07-22 DIAGNOSIS — E119 Type 2 diabetes mellitus without complications: Secondary | ICD-10-CM | POA: Diagnosis not present

## 2017-07-22 DIAGNOSIS — R079 Chest pain, unspecified: Secondary | ICD-10-CM | POA: Diagnosis not present

## 2017-07-22 DIAGNOSIS — I471 Supraventricular tachycardia: Secondary | ICD-10-CM | POA: Diagnosis not present

## 2017-07-22 DIAGNOSIS — I5023 Acute on chronic systolic (congestive) heart failure: Secondary | ICD-10-CM

## 2017-07-22 LAB — ECHOCARDIOGRAM COMPLETE
AVLVOTPG: 3 mmHg
CHL CUP DOP CALC LVOT VTI: 18.1 cm
CHL CUP MV DEC (S): 246
CHL CUP STROKE VOLUME: 28 mL
E/e' ratio: 11.94
EWDT: 246 ms
FS: 7 % — AB (ref 28–44)
HEIGHTINCHES: 65 in
IV/PV OW: 0.76
LADIAMINDEX: 2.4 cm/m2
LASIZE: 36 mm
LAVOL: 59.4 mL
LAVOLA4C: 64.8 mL
LAVOLIN: 39.6 mL/m2
LEFT ATRIUM END SYS DIAM: 36 mm
LV E/e' medial: 11.94
LV E/e'average: 11.94
LV PW d: 11.9 mm — AB (ref 0.6–1.1)
LV dias vol index: 74 mL/m2
LV e' LATERAL: 7.07 cm/s
LV sys vol: 83 mL — AB (ref 14–42)
LVDIAVOL: 111 mL — AB (ref 46–106)
LVOT area: 2.84 cm2
LVOT peak vel: 93.1 cm/s
LVOTD: 19 mm
LVOTSV: 51 mL
LVSYSVOLIN: 55 mL/m2
MV Peak grad: 3 mmHg
MV pk A vel: 119 m/s
MVPKEVEL: 84.4 m/s
PISA EROA: 0.03 cm2
RV sys press: 35 mmHg
Reg peak vel: 284 cm/s
Simpson's disk: 25
TDI e' lateral: 7.07
TDI e' medial: 3.59
TR max vel: 284 cm/s
VTI: 177 cm
WEIGHTICAEL: 1753.1 [oz_av]

## 2017-07-22 LAB — BASIC METABOLIC PANEL
ANION GAP: 8 (ref 5–15)
BUN: 29 mg/dL — AB (ref 6–20)
CHLORIDE: 107 mmol/L (ref 101–111)
CO2: 24 mmol/L (ref 22–32)
Calcium: 9.1 mg/dL (ref 8.9–10.3)
Creatinine, Ser: 1.39 mg/dL — ABNORMAL HIGH (ref 0.44–1.00)
GFR calc Af Amer: 37 mL/min — ABNORMAL LOW (ref >60)
GFR, EST NON AFRICAN AMERICAN: 32 mL/min — AB (ref >60)
GLUCOSE: 94 mg/dL (ref 65–99)
POTASSIUM: 4.1 mmol/L (ref 3.5–5.1)
Sodium: 139 mmol/L (ref 135–145)

## 2017-07-22 LAB — MAGNESIUM: MAGNESIUM: 1.9 mg/dL (ref 1.7–2.4)

## 2017-07-22 MED ORDER — ASPIRIN EC 325 MG PO TBEC: 325.0000 mg | DELAYED_RELEASE_TABLET | Freq: Every day | ORAL | 0 refills | Status: DC

## 2017-07-22 NOTE — Discharge Summary (Signed)
Physician Discharge Summary  [name] [name] [facility] ENM:[id number] DOB: 19-Mar-1924 DOA: 07/20/2017  PCP: Kirstie Peri, MD  Admit date: 07/20/2017 Discharge date: 07/22/2017  Admitted From: Home Disposition:  Home  Recommendations for Outpatient Follow-up:  1. Follow up with PCP in 1-week 2. Recommended to continue full dose aspirin.:  Home Health: Yes Equipment/Devices: No    Discharge Condition: Stable CODE STATUS: Full  Diet recommendation: Heart healthy and diabetic diet  Brief/Interim Summary: 83 year old female who presented with chest pain. Patient does have significant past medical history for diabetes mellitus type 2, hypertension, dyslipidemia, systolic heart failure ejection fraction 25%, and history of embolic stroke. Patient presents with acute precordial chest pain, it occurred while ambulating, and lasted for a few seconds, self resolved with no associated symptoms. Denies any palpitations, angina, PND, orthopnea or lower extremity edema. Takes furosemide as needed. On initial physical examination blood pressure 123/71, heart rate 95-102, respiratory rate 29, temperature 98.4, oxygen saturation 99%. Heart S1-S2 present and rhythmic, no gallops or murmurs, lungs were clear to auscultation bilaterally, no wheezing or rhonchi, abdomen soft nontender, no edema. Sodium 139, potassium 4.5, chloride 108, bicarbonate 24, glucose 88, BUN 27, creatinine 1.16, troponin 0.03, 0.04, white count 5.1, hemoglobin 9.2, hematocrit 35.4, platelets 160. Chest film with increase vascular congestion and small bilateral pleural effusions, CT chest with bilateral groundglass opacities, left lower lobe atelectasis, positive emphysematous changes and bronchiectasis. EKG sinus rhythm, left bundle-branch block, LVH with positive repolarization changes.   Patient was admitted to the hospital with the working diagnosis of acute chest pain complicated by elevated troponins, and cardiogenic pulmonary edema, systolic  heart failure decompensation, acute on chronic.   1. Atypical chest pain. No further chest pain, deep troponin remained flat 0.04, old records personally review, history of nonischemic cardiomyopathy with left ventricle ejection fraction 25%. Continue aspirin, isosorbide, beta blockade and statin therapy. Patient ruled out for acute coronary syndrome.  2. Episode of supraventricular tachycardia. Telemetry monitor showed one episode of 32 beat of narrow complex tachycardia, mostly regular, patient asymptomatic. She does have history of embolic stroke, patient will benefit from outpatient Holter monitoring. With the current telemetry strips, not clear atrial fibrillation. No cardiology consultation available today. Will recommend patient to continue full dose aspirin, and beta blockade.  Serum magnesium was corrected, her telemetry remained sinus rhythm with no further,  episodes of arrhythmias, and patient was deemed stable to be discharged home with home health services.   3. Decompensated systolic heart failure with cardiogenic pulmonary edema. Chest imaging suggests a vascular congestion, patient received 1 dose of IV furosemide, no significant dyspnea, oxygen saturation at discharge 98% on room air. Will recommend to continue furosemide as needed.  4.  Chronic kidney disease stage III with hypomagnesemia. Kidney function remained stable, considering patient body habitus, likely her serum creatinine underestimates her glomerular filtration rate. Magnesium was corrected with magnesium sulfate.   5. Hypertension. Continue isosorbide and metoprolol. Target mean arterial pressure 60 and above.  6. History of embolic CVA. Patient remained nonfocal, continue statin and full dose aspirin.  Discharge Diagnoses:  Principal Problem:   Chest pain Active Problems:   Hyperlipidemia   Essential hypertension   Type II diabetes mellitus (HCC)    Discharge Instructions   Allergies as of 07/22/2017   No  Known Allergies     Medication List    TAKE these medications   aspirin EC 325 MG tablet Take 1 tablet (325 mg total) by mouth daily.   atorvastatin 40 MG tablet  Commonly known as:  LIPITOR Take 1 tablet by mouth daily.   furosemide 40 MG tablet Commonly known as:  LASIX Take 1 tablet by mouth daily as needed for fluid.   gabapentin 100 MG capsule Commonly known as:  NEURONTIN Take 100 mg by mouth 2 (two) times daily.   isosorbide mononitrate 30 MG 24 hr tablet Commonly known as:  IMDUR Take 1 tablet by mouth daily.   metoprolol succinate 25 MG 24 hr tablet Commonly known as:  TOPROL-XL Take 1 tablet by mouth daily.   nitroGLYCERIN 0.4 MG SL tablet Commonly known as:  NITROSTAT Place 0.4 mg under the tongue every 5 (five) minutes as needed.       No Known Allergies  Consultations:     Procedures/Studies: Ct Chest Wo Contrast  Result Date: 07/20/2017 CLINICAL DATA:  Centralized chest pain after walking EXAM: CT CHEST WITHOUT CONTRAST TECHNIQUE: Multidetector CT imaging of the chest was performed following the standard protocol without IV contrast. COMPARISON:  Chest radiograph 07/20/2017 FINDINGS: Cardiovascular: Coronary artery calcification and aortic atherosclerotic calcification. Heart is enlarged. No pericardial effusion Mediastinum/Nodes: No axillary or supraclavicular adenopathy. No mediastinal hilar adenopathy. Lungs/Pleura: Scattered ground-glass nodules in the RIGHT upper lobe (image 56, series 4, for example). Bibasilar bronchial wall thickening. Small bilateral pleural effusions. No suspicious nodularity. Upper Abdomen: Limited view of the liver, , pancreas are unremarkable. Atrophic LEFT kidney. Benign calcifications of the adrenal gland suggest prior infection or hemorrhage Musculoskeletal: No aggressive osseous lesions IMPRESSION: 1. Patchy ground-glass nodules in the RIGHT upper lobe suggests mild pulmonary infection / pneumonia. 2. Bibasilar bronchial  thickening consistent with bronchitis. 3. Extensive coronary artery calcification. 4. Cardiomegaly. 5. Small bilateral pleural effusions. Electronically Signed   By: Genevive BiStewart  Edmunds M.D.   On: 07/20/2017 13:42   Dg Chest Port 1 View  Result Date: 07/20/2017 CLINICAL DATA:  Chest pain. EXAM: PORTABLE CHEST 1 VIEW COMPARISON:  Chest x-ray dated April 18, 2017. FINDINGS: Mild cardiomegaly, unchanged. Increased pulmonary vascularity and interstitial markings. Patchy density in the left lower lobe. Small left pleural effusion. No pneumothorax. No acute osseous abnormality. IMPRESSION: 1. Mild cardiomegaly with increased pulmonary vascular congestion and interstitial edema. Small left pleural effusion. 2. Patchy opacities in the left lower lobe, likely atelectasis, although early pneumonia could have a similar appearance. Electronically Signed   By: Obie DredgeWilliam T Derry M.D.   On: 07/20/2017 09:47       Subjective: Patient feeling better, no chest pain, dyspnea, no nausea or vomiting, no abdominal pain.   Discharge Exam: Vitals:   07/21/17 2212 07/22/17 0612  BP: (!) 93/44 (!) 88/44  Pulse: 66 62  Resp: 20 20  Temp: 97.7 F (36.5 C) 98.4 F (36.9 C)  SpO2: 98% 98%   Vitals:   07/21/17 0407 07/21/17 1454 07/21/17 2212 07/22/17 0612  BP: 116/70 (!) 102/46 (!) 93/44 (!) 88/44  Pulse: 97 71 66 62  Resp: 18 16 20 20   Temp: 97.8 F (36.6 C) 97.8 F (36.6 C) 97.7 F (36.5 C) 98.4 F (36.9 C)  TempSrc: Oral Oral Oral Oral  SpO2: 98% 98% 98% 98%  Weight:   49.7 kg (109 lb 9.1 oz)   Height:        General: Pt is alert, awake, not in acute distress E ENT. Mild pallor, no icterus, oral mucosa moist. Cardiovascular: RRR, S1/S2 +, no rubs, no gallops. No JVD Respiratory: CTA bilaterally, no wheezing, no rhonchi Abdominal: Soft, NT, ND, bowel sounds + Extremities: no edema, no cyanosis  The results of significant diagnostics from this hospitalization (including imaging, microbiology,  ancillary and laboratory) are listed below for reference.     Microbiology: Recent Results (from the past 240 hour(s))  Culture, blood (routine x 2) Call MD if unable to obtain prior to antibiotics being given     Status: None (Preliminary result)   Collection Time: 07/20/17  5:25 PM  Result Value Ref Range Status   Specimen Description BLOOD LEFT HAND  Final   Special Requests   Final    BOTTLES DRAWN AEROBIC AND ANAEROBIC Blood Culture results may not be optimal due to an inadequate volume of blood received in culture bottles   Culture NO GROWTH 2 DAYS  Final   Report Status PENDING  Incomplete  Culture, blood (routine x 2) Call MD if unable to obtain prior to antibiotics being given     Status: None (Preliminary result)   Collection Time: 07/20/17  5:28 PM  Result Value Ref Range Status   Specimen Description RIGHT ANTECUBITAL  Final   Special Requests   Final    BOTTLES DRAWN AEROBIC AND ANAEROBIC Blood Culture results may not be optimal due to an inadequate volume of blood received in culture bottles   Culture NO GROWTH 2 DAYS  Final   Report Status PENDING  Incomplete     Labs: BNP (last 3 results) No results for input(s): BNP in the last 8760 hours. Basic Metabolic Panel:  Recent Labs Lab 07/20/17 1021 07/21/17 0457 07/22/17 0559  NA 139  --  139  K 4.5  --  4.1  CL 108  --  107  CO2 24  --  24  GLUCOSE 88  --  94  BUN 27*  --  29*  CREATININE 1.16*  --  1.39*  CALCIUM 9.2  --  9.1  MG  --  1.6* 1.9   Liver Function Tests: No results for input(s): AST, ALT, ALKPHOS, BILITOT, PROT, ALBUMIN in the last 168 hours. No results for input(s): LIPASE, AMYLASE in the last 168 hours. No results for input(s): AMMONIA in the last 168 hours. CBC:  Recent Labs Lab 07/20/17 1021  WBC 5.1  NEUTROABS 3.5  HGB 11.2*  HCT 35.4*  MCV 93.7  PLT 160   Cardiac Enzymes:  Recent Labs Lab 07/20/17 1021 07/20/17 1358 07/20/17 1725 07/20/17 2306 07/21/17 0457  TROPONINI  0.03* 0.04* 0.04* 0.04* 0.04*   BNP: Invalid input(s): POCBNP CBG: No results for input(s): GLUCAP in the last 168 hours. D-Dimer No results for input(s): DDIMER in the last 72 hours. Hgb A1c No results for input(s): HGBA1C in the last 72 hours. Lipid Profile  Recent Labs  07/21/17 0457  CHOL 141  HDL 43  LDLCALC 76  TRIG 112  CHOLHDL 3.3   Thyroid function studies  Recent Labs  07/20/17 1728  TSH 0.623   Anemia work up No results for input(s): VITAMINB12, FOLATE, FERRITIN, TIBC, IRON, RETICCTPCT in the last 72 hours. Urinalysis    Component Value Date/Time   COLORURINE YELLOW 12/30/2007 1850   APPEARANCEUR HAZY (A) 12/30/2007 1850   LABSPEC 1.020 12/30/2007 1850   PHURINE 6.5 12/30/2007 1850   GLUCOSEU NEGATIVE 12/30/2007 1850   HGBUR NEGATIVE 12/30/2007 1850   BILIRUBINUR NEGATIVE 12/30/2007 1850   KETONESUR NEGATIVE 12/30/2007 1850   PROTEINUR NEGATIVE 12/30/2007 1850   UROBILINOGEN 0.2 12/30/2007 1850   NITRITE POSITIVE (A) 12/30/2007 1850   LEUKOCYTESUR MODERATE (A) 12/30/2007 1850   Sepsis Labs Invalid input(s): PROCALCITONIN,  WBC,  LACTICIDVEN Microbiology Recent Results (from the past 240 hour(s))  Culture, blood (routine x 2) Call MD if unable to obtain prior to antibiotics being given     Status: None (Preliminary result)   Collection Time: 07/20/17  5:25 PM  Result Value Ref Range Status   Specimen Description BLOOD LEFT HAND  Final   Special Requests   Final    BOTTLES DRAWN AEROBIC AND ANAEROBIC Blood Culture results may not be optimal due to an inadequate volume of blood received in culture bottles   Culture NO GROWTH 2 DAYS  Final   Report Status PENDING  Incomplete  Culture, blood (routine x 2) Call MD if unable to obtain prior to antibiotics being given     Status: None (Preliminary result)   Collection Time: 07/20/17  5:28 PM  Result Value Ref Range Status   Specimen Description RIGHT ANTECUBITAL  Final   Special Requests   Final     BOTTLES DRAWN AEROBIC AND ANAEROBIC Blood Culture results may not be optimal due to an inadequate volume of blood received in culture bottles   Culture NO GROWTH 2 DAYS  Final   Report Status PENDING  Incomplete     Time coordinating discharge: 45 minutes  SIGNED:   Coralie Keens, MD  [facility] 07/22/2017, 10:30 AM Pager [phone]  If 7PM-7AM, please contact night-coverage [email or web address] Password [email or web address]

## 2017-07-22 NOTE — Progress Notes (Signed)
Patient is very angry about her breakfast this morning, that she got [place] bacon instead of toast like she wanted. She also wants to be discharged as soon as possible and "get out of this hospital."

## 2017-07-22 NOTE — Progress Notes (Signed)
Pat

## 2017-07-22 NOTE — Care Management Note (Signed)
Case Management Note  Patient Details  Name: [name] [name] MRN: [id number] Date of Birth: 1923/12/10  Subjective/Objective:                 Spoke to patient on the phone, she is agreeable to [facility]. She would like to use [facility] for [facility] services. Referral accepted by [facility]. No other CM needs identified.   Action/Plan:   Expected Discharge Date:  07/22/17               Expected Discharge Plan:  Home w Home Health Services  In-House Referral:     Discharge planning Services  CM Consult  Post Acute Care Choice:  NA Choice offered to:  NA  DME Arranged:    DME Agency:     HH Arranged:  RN, PT, OT, Nurse's Aide HH Agency:  [facility]  Status of Service:  Completed, signed off  If discussed at Long Length of Stay Meetings, dates discussed:    Additional Comments:  Lawerance Sabal, RN 07/22/2017, 11:04 AM

## 2017-07-22 NOTE — Progress Notes (Signed)
*  PRELIMINARY RESULTS* Echocardiogram 2D Echocardiogram has been performed.  Stacey Drain 07/22/2017, 10:56 AM

## 2017-07-25 LAB — CULTURE, BLOOD (ROUTINE X 2)
CULTURE: NO GROWTH
CULTURE: NO GROWTH

## 2017-10-15 ENCOUNTER — Inpatient Hospital Stay (HOSPITAL_COMMUNITY)
Admission: EM | Admit: 2017-10-15 | Discharge: 2017-10-18 | DRG: 291 | Disposition: A | Discharge status: Home Health | Payer: Medicare Other | Attending: Internal Medicine | Admitting: Internal Medicine

## 2017-10-15 ENCOUNTER — Emergency Department (HOSPITAL_COMMUNITY): Payer: Medicare Other

## 2017-10-15 ENCOUNTER — Other Ambulatory Visit: Payer: Self-pay

## 2017-10-15 ENCOUNTER — Encounter (HOSPITAL_COMMUNITY): Payer: Self-pay | Admitting: Emergency Medicine

## 2017-10-15 DIAGNOSIS — R079 Chest pain, unspecified: Secondary | ICD-10-CM | POA: Diagnosis not present

## 2017-10-15 DIAGNOSIS — I509 Heart failure, unspecified: Secondary | ICD-10-CM

## 2017-10-15 DIAGNOSIS — E8770 Fluid overload, unspecified: Secondary | ICD-10-CM

## 2017-10-15 DIAGNOSIS — M129 Arthropathy, unspecified: Secondary | ICD-10-CM | POA: Diagnosis present

## 2017-10-15 DIAGNOSIS — R2689 Other abnormalities of gait and mobility: Secondary | ICD-10-CM | POA: Diagnosis present

## 2017-10-15 DIAGNOSIS — Z9181 History of falling: Secondary | ICD-10-CM

## 2017-10-15 DIAGNOSIS — R32 Unspecified urinary incontinence: Secondary | ICD-10-CM | POA: Diagnosis present

## 2017-10-15 DIAGNOSIS — I252 Old myocardial infarction: Secondary | ICD-10-CM | POA: Diagnosis not present

## 2017-10-15 DIAGNOSIS — I081 Rheumatic disorders of both mitral and tricuspid valves: Secondary | ICD-10-CM | POA: Diagnosis present

## 2017-10-15 DIAGNOSIS — N318 Other neuromuscular dysfunction of bladder: Secondary | ICD-10-CM | POA: Diagnosis present

## 2017-10-15 DIAGNOSIS — N3281 Overactive bladder: Secondary | ICD-10-CM | POA: Diagnosis present

## 2017-10-15 DIAGNOSIS — D631 Anemia in chronic kidney disease: Secondary | ICD-10-CM | POA: Diagnosis present

## 2017-10-15 DIAGNOSIS — I2510 Atherosclerotic heart disease of native coronary artery without angina pectoris: Secondary | ICD-10-CM | POA: Diagnosis present

## 2017-10-15 DIAGNOSIS — L299 Pruritus, unspecified: Secondary | ICD-10-CM | POA: Diagnosis present

## 2017-10-15 DIAGNOSIS — I248 Other forms of acute ischemic heart disease: Secondary | ICD-10-CM | POA: Diagnosis present

## 2017-10-15 DIAGNOSIS — E785 Hyperlipidemia, unspecified: Secondary | ICD-10-CM | POA: Diagnosis present

## 2017-10-15 DIAGNOSIS — E1122 Type 2 diabetes mellitus with diabetic chronic kidney disease: Secondary | ICD-10-CM | POA: Diagnosis present

## 2017-10-15 DIAGNOSIS — I5043 Acute on chronic combined systolic (congestive) and diastolic (congestive) heart failure: Secondary | ICD-10-CM | POA: Diagnosis present

## 2017-10-15 DIAGNOSIS — N170 Acute kidney failure with tubular necrosis: Secondary | ICD-10-CM

## 2017-10-15 DIAGNOSIS — Z86711 Personal history of pulmonary embolism: Secondary | ICD-10-CM

## 2017-10-15 DIAGNOSIS — F341 Dysthymic disorder: Secondary | ICD-10-CM | POA: Diagnosis present

## 2017-10-15 DIAGNOSIS — I429 Cardiomyopathy, unspecified: Secondary | ICD-10-CM

## 2017-10-15 DIAGNOSIS — I447 Left bundle-branch block, unspecified: Secondary | ICD-10-CM | POA: Diagnosis present

## 2017-10-15 DIAGNOSIS — I361 Nonrheumatic tricuspid (valve) insufficiency: Secondary | ICD-10-CM | POA: Diagnosis not present

## 2017-10-15 DIAGNOSIS — I130 Hypertensive heart and chronic kidney disease with heart failure and stage 1 through stage 4 chronic kidney disease, or unspecified chronic kidney disease: Secondary | ICD-10-CM | POA: Diagnosis not present

## 2017-10-15 DIAGNOSIS — N184 Chronic kidney disease, stage 4 (severe): Secondary | ICD-10-CM | POA: Diagnosis present

## 2017-10-15 DIAGNOSIS — E119 Type 2 diabetes mellitus without complications: Secondary | ICD-10-CM | POA: Diagnosis not present

## 2017-10-15 DIAGNOSIS — E8889 Other specified metabolic disorders: Secondary | ICD-10-CM | POA: Diagnosis present

## 2017-10-15 DIAGNOSIS — Z8661 Personal history of infections of the central nervous system: Secondary | ICD-10-CM

## 2017-10-15 DIAGNOSIS — N179 Acute kidney failure, unspecified: Secondary | ICD-10-CM | POA: Diagnosis present

## 2017-10-15 DIAGNOSIS — I10 Essential (primary) hypertension: Secondary | ICD-10-CM | POA: Diagnosis present

## 2017-10-15 DIAGNOSIS — R262 Difficulty in walking, not elsewhere classified: Secondary | ICD-10-CM

## 2017-10-15 DIAGNOSIS — I5023 Acute on chronic systolic (congestive) heart failure: Secondary | ICD-10-CM | POA: Diagnosis not present

## 2017-10-15 DIAGNOSIS — R110 Nausea: Secondary | ICD-10-CM | POA: Diagnosis not present

## 2017-10-15 DIAGNOSIS — I428 Other cardiomyopathies: Secondary | ICD-10-CM | POA: Diagnosis present

## 2017-10-15 DIAGNOSIS — Z8673 Personal history of transient ischemic attack (TIA), and cerebral infarction without residual deficits: Secondary | ICD-10-CM

## 2017-10-15 DIAGNOSIS — E875 Hyperkalemia: Secondary | ICD-10-CM | POA: Diagnosis present

## 2017-10-15 LAB — URINALYSIS, ROUTINE W REFLEX MICROSCOPIC
Bacteria, UA: NONE SEEN
Bilirubin Urine: NEGATIVE
GLUCOSE, UA: NEGATIVE mg/dL
Ketones, ur: NEGATIVE mg/dL
Leukocytes, UA: NEGATIVE
Nitrite: NEGATIVE
PROTEIN: NEGATIVE mg/dL
SPECIFIC GRAVITY, URINE: 1.016 (ref 1.005–1.030)
pH: 5 (ref 5.0–8.0)

## 2017-10-15 LAB — BRAIN NATRIURETIC PEPTIDE: B Natriuretic Peptide: >4500 pg/mL — ABNORMAL HIGH (ref 0.0–100.0)

## 2017-10-15 LAB — CBC WITH DIFFERENTIAL/PLATELET
BASOS ABS: 0 10*3/uL (ref 0.0–0.1)
Basophils Relative: 0 %
Eosinophils Absolute: 0.1 10*3/uL (ref 0.0–0.7)
Eosinophils Relative: 1 %
HCT: 38.4 % (ref 36.0–46.0)
HEMOGLOBIN: 11.9 g/dL — AB (ref 12.0–15.0)
LYMPHS PCT: 15 %
Lymphs Abs: 1 10*3/uL (ref 0.7–4.0)
MCH: 28.1 pg (ref 26.0–34.0)
MCHC: 31 g/dL (ref 30.0–36.0)
MCV: 90.6 fL (ref 78.0–100.0)
Monocytes Absolute: 0.4 10*3/uL (ref 0.1–1.0)
Monocytes Relative: 7 %
NEUTROS ABS: 5.2 10*3/uL (ref 1.7–7.7)
Neutrophils Relative %: 77 %
Platelets: 189 10*3/uL (ref 150–400)
RBC: 4.24 MIL/uL (ref 3.87–5.11)
RDW: 13.6 % (ref 11.5–15.5)
WBC: 6.8 10*3/uL (ref 4.0–10.5)

## 2017-10-15 LAB — COMPREHENSIVE METABOLIC PANEL
ALK PHOS: 117 U/L (ref 38–126)
ALT: 27 U/L (ref 14–54)
AST: 30 U/L (ref 15–41)
Albumin: 4.1 g/dL (ref 3.5–5.0)
Anion gap: 13 (ref 5–15)
BILIRUBIN TOTAL: 1 mg/dL (ref 0.3–1.2)
BUN: 42 mg/dL — AB (ref 6–20)
CALCIUM: 10.1 mg/dL (ref 8.9–10.3)
CHLORIDE: 105 mmol/L (ref 101–111)
CO2: 21 mmol/L — ABNORMAL LOW (ref 22–32)
CREATININE: 1.69 mg/dL — AB (ref 0.44–1.00)
GFR calc Af Amer: 29 mL/min — ABNORMAL LOW (ref >60)
GFR, EST NON AFRICAN AMERICAN: 25 mL/min — AB (ref >60)
Glucose, Bld: 126 mg/dL — ABNORMAL HIGH (ref 65–99)
Potassium: 5 mmol/L (ref 3.5–5.1)
Sodium: 139 mmol/L (ref 135–145)
TOTAL PROTEIN: 6.9 g/dL (ref 6.5–8.1)

## 2017-10-15 LAB — TROPONIN I
TROPONIN I: 0.05 ng/mL — AB (ref <0.03)
TROPONIN I: 0.05 ng/mL — AB (ref <0.03)
Troponin I: 0.05 ng/mL (ref <0.03)

## 2017-10-15 LAB — TSH: TSH: 1.589 u[IU]/mL (ref 0.350–4.500)

## 2017-10-15 MED ORDER — SODIUM CHLORIDE 0.9 % IV BOLUS (SEPSIS)
500.0000 mL | Freq: Once | INTRAVENOUS | Status: AC
  Administered 2017-10-15: 500 mL via INTRAVENOUS

## 2017-10-15 MED ORDER — POTASSIUM CHLORIDE CRYS ER 20 MEQ PO TBCR
30.0000 meq | EXTENDED_RELEASE_TABLET | Freq: Two times a day (BID) | ORAL | Status: DC
  Administered 2017-10-15 – 2017-10-16 (×3): 30 meq via ORAL
  Filled 2017-10-15 (×2): qty 1

## 2017-10-15 MED ORDER — PHENOL 1.4 % MT LIQD: 1.0000 | OROMUCOSAL | Status: DC | PRN

## 2017-10-15 MED ORDER — ACETAMINOPHEN 325 MG PO TABS
650.0000 mg | ORAL_TABLET | Freq: Four times a day (QID) | ORAL | Status: DC | PRN
  Administered 2017-10-15: 650 mg via ORAL
  Filled 2017-10-15: qty 2

## 2017-10-15 MED ORDER — HYDROCORTISONE 1 % EX CREA: 1.0000 "application " | TOPICAL_CREAM | Freq: Three times a day (TID) | CUTANEOUS | Status: DC | PRN

## 2017-10-15 MED ORDER — FUROSEMIDE 40 MG PO TABS: 40.0000 mg | ORAL_TABLET | Freq: Every day | ORAL | Status: DC

## 2017-10-15 MED ORDER — HYDROCORTISONE 2.5 % RE CREA
1.0000 "application " | TOPICAL_CREAM | Freq: Four times a day (QID) | RECTAL | Status: DC | PRN
  Filled 2017-10-15: qty 28.35

## 2017-10-15 MED ORDER — GUAIFENESIN-DM 100-10 MG/5ML PO SYRP: 5.0000 mL | ORAL_SOLUTION | ORAL | Status: DC | PRN

## 2017-10-15 MED ORDER — FUROSEMIDE 10 MG/ML IJ SOLN
40.0000 mg | Freq: Once | INTRAMUSCULAR | Status: AC
  Administered 2017-10-15: 40 mg via INTRAVENOUS
  Filled 2017-10-15: qty 4

## 2017-10-15 MED ORDER — ASPIRIN EC 81 MG PO TBEC
81.0000 mg | DELAYED_RELEASE_TABLET | Freq: Every day | ORAL | Status: DC
  Administered 2017-10-16 – 2017-10-18 (×3): 81 mg via ORAL
  Filled 2017-10-15 (×3): qty 1

## 2017-10-15 MED ORDER — HYDROXYZINE HCL 25 MG PO TABS
12.5000 mg | ORAL_TABLET | Freq: Four times a day (QID) | ORAL | Status: DC | PRN
  Administered 2017-10-15 – 2017-10-17 (×3): 12.5 mg via ORAL
  Filled 2017-10-15 (×3): qty 1

## 2017-10-15 MED ORDER — NITROGLYCERIN 0.4 MG SL SUBL: 0.4000 mg | SUBLINGUAL_TABLET | SUBLINGUAL | Status: DC | PRN

## 2017-10-15 MED ORDER — POLYVINYL ALCOHOL 1.4 % OP SOLN: 1.0000 [drp] | OPHTHALMIC | Status: DC | PRN

## 2017-10-15 MED ORDER — ONDANSETRON HCL 4 MG/2ML IJ SOLN
4.0000 mg | Freq: Once | INTRAMUSCULAR | Status: AC
  Administered 2017-10-15: 4 mg via INTRAVENOUS
  Filled 2017-10-15: qty 2

## 2017-10-15 MED ORDER — MUSCLE RUB 10-15 % EX CREA
1.0000 "application " | TOPICAL_CREAM | CUTANEOUS | Status: DC | PRN
  Filled 2017-10-15: qty 85

## 2017-10-15 MED ORDER — SALINE SPRAY 0.65 % NA SOLN: 1.0000 | NASAL | Status: DC | PRN

## 2017-10-15 MED ORDER — LIP MEDEX EX OINT
1.0000 "application " | TOPICAL_OINTMENT | CUTANEOUS | Status: DC | PRN
  Filled 2017-10-15: qty 7

## 2017-10-15 MED ORDER — ENOXAPARIN SODIUM 30 MG/0.3ML ~~LOC~~ SOLN
30.0000 mg | SUBCUTANEOUS | Status: DC
  Administered 2017-10-16 – 2017-10-17 (×2): 30 mg via SUBCUTANEOUS
  Filled 2017-10-15 (×3): qty 0.3

## 2017-10-15 MED ORDER — HYDROXYZINE HCL 25 MG PO TABS
12.5000 mg | ORAL_TABLET | Freq: Once | ORAL | Status: AC
  Administered 2017-10-15: 12.5 mg via ORAL
  Filled 2017-10-15: qty 1

## 2017-10-15 NOTE — H&P (Signed)
History and Physical  [name]:[id number] DOB: 1924-08-31 DOA: 10/15/2017  Referring physician: Erin [name] PCP: [name] Peri, MD   Chief Complaint: itching   HPI: [name] [name] is a 84 y.o. 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  She says that she has been itching more for the past 2 weeks. She was hospitalized in [place] and told she had a mild heart attack.  None of the medications given helped with the itching. She went to a dermatologist and they were not able to help her itching symptoms.  She has had some intermittent chest pain and shortness of breath over the last couple of weeks. She denies swelling in legs and orthopnea.  She denies cough, fever and chills. She denies extremity weakness.  She denies rash on skin and reports that she has been concerned about her kidneys.  She is not taking any medications except for nitroglycerin.    ED Course: The patient was noted to have elevated BUN/Cr, BNP>4500.  She had some clinical findings of congestive heart failure although her chest xray did not show pulmonary edema.  Her troponin was mildly elevated at 0.05.  She was given 40 mg IVP lasix and admission was requested.    Review of Systems: All systems reviewed and apart from history of presenting illness, are negative.  Past Medical History:  Diagnosis Date  . Arthritis   . Coronary artery disease    Cath 2004--60% LAD--diffuse and calcified, 40% PDA  . Depression   . Hyperlipidemia   . Hypertension   . Nonischemic cardiomyopathy (HCC)    EF 30% 2004, last echo 10/2006 EF=60-65%  . Overactive bladder   . Pulmonary embolism (HCC)    status post  . Type II diabetes mellitus (HCC)    Past Surgical History:  Procedure Laterality Date  . APPENDECTOMY  1959  . BACK SURGERY  1967; 1977  .  CATARACT EXTRACTION W/ INTRAOCULAR LENS  IMPLANT, BILATERAL  2008   right eye  . CHOLECYSTECTOMY  1959  . TONSILLECTOMY     Social History:  reports that  has never smoked. she has never used smokeless tobacco. She reports that she does not drink alcohol or use drugs.  No Known Allergies  Family History  Problem Relation Age of Onset  . Stroke Mother        deceased    Prior to Admission medications   Medication Sig Start Date End Date Taking? Authorizing Provider  nitroGLYCERIN (NITROSTAT) 0.4 MG SL tablet Place 0.4 mg under the tongue every 5 (five) minutes as needed.    [provider]   Physical Exam: Vitals:   10/15/17 1145 10/15/17 1200 10/15/17 1215 10/15/17 1220  BP: 124/81 120/77  120/77  Pulse: 94  96 99  Resp: (!) 7 (!) 22 (!) 26 18  Temp:      TempSrc:      SpO2: 96%  96% 100%  Weight:      Height:         General exam: thin elderly female, well developed, lying comfortably supine on the gurney in no obvious distress.  Head, eyes and ENT: Nontraumatic and normocephalic. Pupils equally reacting to light and accommodation. Edentulous. Oral mucosa moist.  Neck: Supple. No JVD, carotid bruit or thyromegaly.  Lymphatics: No lymphadenopathy.  Respiratory system: bibasilar  crackles heard. No increased work of breathing.  Cardiovascular system: S1 and S2 heard, RRR. No JVD, murmurs, gallops, clicks or pedal edema.  Gastrointestinal system: Abdomen is nondistended, soft and nontender. Normal bowel sounds heard. No organomegaly or masses appreciated.  Central nervous system: Alert and oriented. No focal neurological deficits.  Extremities: Symmetric 5 x 5 power. Peripheral pulses symmetrically felt.   Skin: No rashes or acute findings.  Musculoskeletal system: Negative exam.  Psychiatry: Pleasant and cooperative.  Labs on Admission:  Basic Metabolic Panel: Recent Labs  Lab 10/15/17 0926  NA 139  K 5.0  CL 105  CO2 21*  GLUCOSE 126*  BUN  42*  CREATININE 1.69*  CALCIUM 10.1   Liver Function Tests: Recent Labs  Lab 10/15/17 0926  AST 30  ALT 27  ALKPHOS 117  BILITOT 1.0  PROT 6.9  ALBUMIN 4.1   No results for input(s): LIPASE, AMYLASE in the last 168 hours. No results for input(s): AMMONIA in the last 168 hours. CBC: Recent Labs  Lab 10/15/17 0926  WBC 6.8  NEUTROABS 5.2  HGB 11.9*  HCT 38.4  MCV 90.6  PLT 189   Cardiac Enzymes: Recent Labs  Lab 10/15/17 0926  TROPONINI 0.05*    BNP (last 3 results) No results for input(s): PROBNP in the last 8760 hours. CBG: No results for input(s): GLUCAP in the last 168 hours.  Radiological Exams on Admission: Dg Chest 2 View  Result Date: 10/15/2017 CLINICAL DATA:  Shortness of Breath EXAM: CHEST  2 VIEW COMPARISON:  September 26, 2017 FINDINGS: There is no appreciable edema or consolidation. There is atelectatic change in left base. There is cardiomegaly with pulmonary vascularity within normal limits. There is aortic atherosclerosis. Bones are osteoporotic. There is degenerative change in the thoracic spine and in each shoulder. IMPRESSION: Cardiomegaly. Left base atelectasis. No edema or consolidation. There is aortic atherosclerosis. Aortic Atherosclerosis (ICD10-I70.0). Electronically Signed   By: Bretta Bang III M.D.   On: 10/15/2017 10:18    EKG: Independently reviewed. LBBB seen.   Assessment/Plan Principal Problem:   Acute exacerbation of CHF (congestive heart failure) (HCC) Active Problems:   Hyperlipidemia   Dysthymic disorder   Essential hypertension   Left bundle branch block (LBBB)   OVERACTIVE BLADDER   Arthropathy   Difficulty walking   Type II diabetes mellitus (HCC)   Chest pain   Nonischemic cardiomyopathy (HCC)   Coronary artery disease   1. Mild acute systolic CHF exacerbation - there is not visible evidence of pulmonary edema on chest xray, she does have some crackles at the bases on exam.  She was ordered for lasix 40 mg IVP  in ED. Will start oral lasix tomorrow.  Monitor weight, I/Os, electrolytes. Obtain 2D echocardiogram to assess heart function.  I am concerned that her cardiomyopathy is worsening particularly if she did have a mild heart attack 3 weeks ago.  I am trying to obtain old records from recent hospitalization in [place].  2. CAD - Pt is only taking nitroglycerin as needed for chest pain.  Due to her advance age and co-morbidities, she had been slowly taken off her cardiac medications for safety reasons, fall risk, etc.  Will cycle troponin. 3. LBBB - this is a chronic finding noted on many older EKGs.   4. Type 2 DM - this is diet controlled and she no longer takes medications for this. Will provide carb modified diet.  5. Essential hypertension - she is no longer on blood pressure medications,  will follow.  6. CKD stage 4 - Worsening CKD may unfortunately be causing her symptoms of nausea and pruritus.  I have asked for a nephrology consultation.  I suspect that he creatinine is much worse but not reflected in lab value because of her small stature and small amount of muscle mass.  I am not sure that at her advanced age that she would be a dialysis candidate.  I did speak with her about this. She tells me that she wants to have everything done.   7. Gait instability - fall precautions ordered and PT evaluation requested.  8. Code Status: I spoke with patient and I spoke with her POA [name] [name] and she wishes to remain FULL CODE at this time.    DVT Prophylaxis: enoxaparin Code Status: FULL   Family Communication: POA (telephone)  Disposition Plan: TBD   Time spent: 58 mins  Standley Dakins, MD [facility] Pager [phone]  If 7PM-7AM, please contact night-coverage [email or web address] Password [facility] 10/15/2017, 12:37 PM

## 2017-10-15 NOTE — Progress Notes (Signed)
Pt refused Lovenox. Educated but still refused.

## 2017-10-15 NOTE — ED Notes (Signed)
CRITICAL VALUE ALERT  Critical Value:  Troponin 0.05  Date & Time Notied:   10/15/17 @ 1030  Provider Notified: Dr Clayborne Dana  Orders Received/Actions taken: see orders.

## 2017-10-15 NOTE — Progress Notes (Signed)
At approximately 1840 the RN was doing intake assessment with patient. An RT will return to instruct patient on use of IS.

## 2017-10-15 NOTE — ED Provider Notes (Signed)
Emergency Department Provider Note   I have reviewed the triage vital signs and the nursing notes.   HISTORY  Chief Complaint Nausea   HPI BIRDIA BEOUGHER is a 84 y.o. female who presents the emergency department today mostly for itching.  She states is been going on for about 2 weeks and she left the hospital and eating and she is seen her primary doctor and a dermatologist without an explanation for it.  She has also had chest pain in the last couple weeks along with some shortness of breath at some point in the last couple weeks.  Does not noticed any worsening swelling or change in her appetite and itchiness is intermittent and diffuse in nature currently only on her back.  Has had some nausea with it but no vomiting.  No diarrhea or constipation.  She has not noticed any rashes. No other associated or modifying symptoms.    Past Medical History:  Diagnosis Date  . Arthritis   . Coronary artery disease    Cath 2004--60% LAD--diffuse and calcified, 40% PDA  . Depression   . Hyperlipidemia   . Hypertension   . Nonischemic cardiomyopathy (HCC)    EF 30% 2004, last echo 10/2006 EF=60-65%  . Overactive bladder   . Pulmonary embolism (HCC)    status post  . Type II diabetes mellitus [facility])     Patient Active Problem List   Diagnosis Date Noted  . Chest pain 07/20/2017  . Type II diabetes mellitus (HCC)   . Chest pain 02/08/2012  . PE 10/01/2009  . Cardiomyopathy (HCC) 10/01/2009  . WALKING DIFFICULTY 10/01/2009  . DYSTHYMIC DISORDER 07/06/2009  . UNSPECIFIED CHRONIC ISCHEMIC HEART DISEASE 07/06/2009  . Left bundle branch block (LBBB) 07/06/2009  . Coronary atherosclerosis 12/12/2007  . BACK PAIN, THORACIC REGION, CHRONIC 12/12/2007  . Hyperlipidemia 09/11/2007  . CATARACTS 09/11/2007  . Essential hypertension 09/11/2007  . OVERACTIVE BLADDER 09/11/2007  . ARTHRITIS 09/11/2007    Past Surgical History:  Procedure Laterality Date  . APPENDECTOMY  1959  . BACK  SURGERY  1967; 1977  . CATARACT EXTRACTION W/ INTRAOCULAR LENS  IMPLANT, BILATERAL  2008   right eye  . CHOLECYSTECTOMY  1959  . TONSILLECTOMY      Current Outpatient Rx  . Order #: [id number] Class: Historical Med    Allergies Patient has no known allergies.  Family History  Problem Relation Age of Onset  . Stroke Mother        deceased    Social History Social History   Tobacco Use  . Smoking status: Never Smoker  . Smokeless tobacco: Never Used  Substance Use Topics  . Alcohol use: No  . Drug use: No    Review of Systems  All other systems negative except as documented in the HPI. All pertinent positives and negatives as reviewed in the HPI. ____________________________________________   PHYSICAL EXAM:  VITAL SIGNS: ED Triage Vitals  Enc Vitals Group     BP 10/15/17 0841 131/73     Pulse Rate 10/15/17 0841 91     Resp 10/15/17 0841 18     Temp 10/15/17 0841 97.7 F (36.5 C)     Temp Source 10/15/17 0841 Oral     SpO2 10/15/17 0841 98 %     Weight 10/15/17 0843 109 lb (49.4 kg)     Height 10/15/17 0843 5\' 3"  (1.6 m)    Constitutional: Alert and oriented. Well appearing and in no acute distress. Eyes: Conjunctivae are normal.  PERRL. EOMI. Head: Atraumatic. Nose: No congestion/rhinnorhea. Mouth/Throat: Mucous membranes are moist.  Oropharynx non-erythematous. Neck: No stridor.  No meningeal signs.   Cardiovascular: Normal rate, regular rhythm. Good peripheral circulation. Grossly normal heart sounds.   Respiratory: Normal respiratory effort.  No retractions. Lungs with crackles in the bases. Gastrointestinal: Soft and nontender. No distention.  Musculoskeletal: No lower extremity tenderness nor edema. No gross deformities of extremities. Neurologic:  Normal speech and language. No gross focal neurologic deficits are appreciated.  Skin:  Skin is warm, dry and intact. No rash noted.   ____________________________________________   LABS (all labs  ordered are listed, but only abnormal results are displayed)  Labs Reviewed  CBC WITH DIFFERENTIAL/PLATELET - Abnormal; Notable for the following components:      Result Value   Hemoglobin 11.9 (*)    All other components within normal limits  COMPREHENSIVE METABOLIC PANEL - Abnormal; Notable for the following components:   CO2 21 (*)    Glucose, Bld 126 (*)    BUN 42 (*)    Creatinine, Ser 1.69 (*)    GFR calc non Af Amer 25 (*)    GFR calc Af Amer 29 (*)    All other components within normal limits  TROPONIN I - Abnormal; Notable for the following components:   Troponin I 0.05 (*)    All other components within normal limits  BRAIN NATRIURETIC PEPTIDE - Abnormal; Notable for the following components:   B Natriuretic Peptide >4,500.0 (*)    All other components within normal limits  URINALYSIS, ROUTINE W REFLEX MICROSCOPIC   ____________________________________________  EKG   EKG Interpretation  Date/Time:    Ventricular Rate:    PR Interval:    QRS Duration:   QT Interval:    QTC Calculation:   R Axis:     Text Interpretation:         ____________________________________________  RADIOLOGY  Dg Chest 2 View  Result Date: 10/15/2017 CLINICAL DATA:  Shortness of Breath EXAM: CHEST  2 VIEW COMPARISON:  September 26, 2017 FINDINGS: There is no appreciable edema or consolidation. There is atelectatic change in left base. There is cardiomegaly with pulmonary vascularity within normal limits. There is aortic atherosclerosis. Bones are osteoporotic. There is degenerative change in the thoracic spine and in each shoulder. IMPRESSION: Cardiomegaly. Left base atelectasis. No edema or consolidation. There is aortic atherosclerosis. Aortic Atherosclerosis (ICD10-I70.0). Electronically Signed   By: Bretta Bang III M.D.   On: 10/15/2017 10:18    ____________________________________________   PROCEDURES  Procedure(s) performed:    Procedures   ____________________________________________   INITIAL IMPRESSION / ASSESSMENT AND PLAN / ED COURSE  History of coronary artery disease and chronic kidney disease and heart failure we will do a cardiac workup since she has had some fleeting chest pain shortness of breath in the last couple weeks.  Far as itching goes will evaluate for evidence of elevated bilirubin or BUN.  We will also get her in a gown so I can better assess for any evidence of rashes however this point I do not see any.   BUN elevated which could be related to itching? Also with BNP >4500, in asociation with rales in bases and unclear history, will plan for admission for diuresis and to follow her kidney function.    Pertinent labs & imaging results that were available during my care of the patient were reviewed by me and considered in my medical decision making (see chart for details).  ____________________________________________  FINAL  CLINICAL IMPRESSION(S) / ED DIAGNOSES  Final diagnoses:  Hypervolemia, unspecified hypervolemia type     MEDICATIONS GIVEN DURING THIS VISIT:  Medications  furosemide (LASIX) injection 40 mg (not administered)  ondansetron (ZOFRAN) injection 4 mg (4 mg Intravenous Given 10/15/17 0945)  hydrOXYzine (ATARAX/VISTARIL) tablet 12.5 mg (12.5 mg Oral Given 10/15/17 0935)  sodium chloride 0.9 % bolus 500 mL (500 mLs Intravenous New Bag/Given 10/15/17 0946)     NEW OUTPATIENT MEDICATIONS STARTED DURING THIS VISIT:  Current Discharge Medication List      Note:  This note was prepared with assistance of Dragon voice recognition software. Occasional wrong-word or sound-a-like substitutions may have occurred due to the inherent limitations of voice recognition software.   Marily Memos, MD 10/15/17 [phone]

## 2017-10-15 NOTE — ED Triage Notes (Signed)
Pt c/o of nausea since 4 am with itching all over body. A/O x4. No rash present. Pt states she saw her PCP last week.

## 2017-10-16 ENCOUNTER — Inpatient Hospital Stay (HOSPITAL_COMMUNITY): Payer: Medicare Other

## 2017-10-16 ENCOUNTER — Encounter (HOSPITAL_COMMUNITY): Payer: Self-pay | Admitting: Student

## 2017-10-16 DIAGNOSIS — N318 Other neuromuscular dysfunction of bladder: Secondary | ICD-10-CM

## 2017-10-16 LAB — CBC WITH DIFFERENTIAL/PLATELET
BASOS ABS: 0 10*3/uL (ref 0.0–0.1)
BASOS PCT: 0 %
EOS ABS: 0.1 10*3/uL (ref 0.0–0.7)
Eosinophils Relative: 1 %
HEMATOCRIT: 34.8 % — AB (ref 36.0–46.0)
Hemoglobin: 10.8 g/dL — ABNORMAL LOW (ref 12.0–15.0)
LYMPHS ABS: 1.9 10*3/uL (ref 0.7–4.0)
LYMPHS PCT: 35 %
MCH: 27.8 pg (ref 26.0–34.0)
MCHC: 31 g/dL (ref 30.0–36.0)
MCV: 89.7 fL (ref 78.0–100.0)
Monocytes Absolute: 0.6 10*3/uL (ref 0.1–1.0)
Monocytes Relative: 11 %
NEUTROS ABS: 2.7 10*3/uL (ref 1.7–7.7)
Neutrophils Relative %: 53 %
RBC: 3.88 MIL/uL (ref 3.87–5.11)
RDW: 13.3 % (ref 11.5–15.5)
WBC: 5.3 10*3/uL (ref 4.0–10.5)

## 2017-10-16 LAB — BASIC METABOLIC PANEL
Anion gap: 8 (ref 5–15)
BUN: 45 mg/dL — AB (ref 6–20)
CALCIUM: 9.6 mg/dL (ref 8.9–10.3)
CO2: 24 mmol/L (ref 22–32)
CREATININE: 1.96 mg/dL — AB (ref 0.44–1.00)
Chloride: 106 mmol/L (ref 101–111)
GFR calc Af Amer: 25 mL/min — ABNORMAL LOW (ref >60)
GFR, EST NON AFRICAN AMERICAN: 21 mL/min — AB (ref >60)
GLUCOSE: 105 mg/dL — AB (ref 65–99)
POTASSIUM: 5.2 mmol/L — AB (ref 3.5–5.1)
SODIUM: 138 mmol/L (ref 135–145)

## 2017-10-16 LAB — BRAIN NATRIURETIC PEPTIDE: B NATRIURETIC PEPTIDE 5: 4481 pg/mL — AB (ref 0.0–100.0)

## 2017-10-16 LAB — MAGNESIUM: MAGNESIUM: 1.6 mg/dL — AB (ref 1.7–2.4)

## 2017-10-16 LAB — TROPONIN I: TROPONIN I: 0.05 ng/mL — AB (ref <0.03)

## 2017-10-16 LAB — PHOSPHORUS: Phosphorus: 3.6 mg/dL (ref 2.5–4.6)

## 2017-10-16 LAB — VITAMIN D 25 HYDROXY (VIT D DEFICIENCY, FRACTURES): Vit D, 25-Hydroxy: 26.9 ng/mL — ABNORMAL LOW (ref 30.0–100.0)

## 2017-10-16 MED ORDER — FUROSEMIDE 10 MG/ML IJ SOLN
40.0000 mg | Freq: Two times a day (BID) | INTRAMUSCULAR | Status: AC
  Administered 2017-10-16 (×2): 40 mg via INTRAVENOUS
  Filled 2017-10-16 (×3): qty 4

## 2017-10-16 MED ORDER — SODIUM POLYSTYRENE SULFONATE 15 GM/60ML PO SUSP
15.0000 g | Freq: Once | ORAL | Status: AC
  Administered 2017-10-16: 15 g via ORAL
  Filled 2017-10-16: qty 60

## 2017-10-16 MED ORDER — BLISTEX MEDICATED EX OINT
TOPICAL_OINTMENT | CUTANEOUS | Status: DC | PRN
  Filled 2017-10-16: qty 6.3

## 2017-10-16 NOTE — Plan of Care (Signed)
Pt complained of foot cramps throughout the night; which were relieved with Vistaril. Will continue to monitor.

## 2017-10-16 NOTE — Progress Notes (Signed)
Nutrition Education Note  RD consulted for nutrition education regarding acute on chronic onset CHF. She has a hx of CAD, CKD (acute on chronic presentation), Anemia of chronic disease.  RD provided "Low Sodium Nutrition Therapy" handout from the Academy of [employer]etics. Reviewed patient's dietary recall. Provided examples on ways to decrease sodium intake in diet. Discouraged intake of processed foods and use of salt shaker. Encouraged fresh fruits and vegetables as well as whole grain sources of carbohydrates to maximize fiber intake.   RD discussed why it is important for patient to adhere to diet recommendations, and emphasized the role of fluids, foods to avoid, and importance of weighing self daily. Teach back method used.  Expect good compliance. According to pt she doesn't salt her food at the table. She rinses her canned vegetables adds clear water to heat and doesn't eat processed meats such as bacon or sausage.  Filed Weights   10/15/17 0843 10/15/17 1708 10/16/17 0433  Weight: 109 lb (49.4 kg) 108 lb 0.4 oz (49 kg) 106 lb 14.8 oz (48.5 kg)    Body mass index is 18.94 kg/m. Pt meets criteria for low end of normalbased on current BMI.  Current diet order is Heart Healthy/CHO modified, patient is consuming approximately 50% of meals at this time.Feeds herself.   BMP Latest Ref Rng & Units 10/16/2017 10/15/2017 07/22/2017  Glucose 65 - 99 mg/dL 419(Q) 222(L) 94  BUN 6 - 20 mg/dL 79(G) 92(J) 19(E)  Creatinine 0.44 - 1.00 mg/dL 1.74(Y) 8.14(G) 8.18(H)  Sodium 135 - 145 mmol/L 138 139 139  Potassium 3.5 - 5.1 mmol/L 5.2(H) 5.0 4.1  Chloride 101 - 111 mmol/L 106 105 107  CO2 22 - 32 mmol/L 24 21(L) 24  Calcium 8.9 - 10.3 mg/dL 9.6 63.1 9.1    Labs and medications reviewed. No further nutrition interventions warranted at this time. RD contact information provided. If additional nutrition issues arise, please re-consult RD.   Royann Shivers MS,RD,CSG,LDN Office:  [phone] Pager: [phone]

## 2017-10-16 NOTE — Care Management Note (Addendum)
Case Management Note  Patient Details  Name: [name] [name] MRN: [id number] Date of Birth: 1924-08-01  Subjective/Objective:         Admitted with CHF. Pt is from home, lives alone. She has aid that is supposed to come daily but per pt never shows up. Pt reports the aid does not help her clean, her home is dirty because of this, her dirty clothes pile up. The aid removed her clothes from the dryer at the laundry mat before they were dry and make her take them home wet. Pt uses a RW with ambulation. Transportation is difficulty to arrange often. She does not have good luck with [facility]. Her SW at [employer] gives her rides sometimes but, per pt, charges her when others get rides for free. Pt has not had good luck with [facility] aid services and would like to switch to another agency. Pt is not active with HH pta. She has scale but does not use it "because its always the same". Pt is not compliant with medications. She has POA, Sandi Carne.   Action/Plan: Pt plans to return home at [place]. CM will attempt to contact pt's SW with [employer]. They are closed today d/t it being a holiday. Pt may need HH nursing for dx management. Pt stopped taking medications pta because she "didn't think she needed them". This CM believes pt would be very compliant with enough education. Pt may also benefit from [facility] SW consult to make sure pt's aid is doing what she is supposed to or to help follow up with her [employer] SW in finding a new aid. CM will cont to follow.    Expected Discharge Date:     10/20/2016             Expected Discharge Plan:  Home/Self Care  In-House Referral:  NA  Discharge planning Services  CM Consult  Post Acute Care Choice:  NA Choice offered to:  NA  Status of Service:  In process, will continue to follow  Malcolm Metro, RN 10/16/2017, 12:01 PM

## 2017-10-16 NOTE — Progress Notes (Signed)
PROGRESS NOTE    [name]  XYB:[id number]  DOB: 09-06-1924  DOA: 10/15/2017 PCP: Kirstie Peri, MD   Brief Admission Hx: [name] [name] is a 84 y.o. 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  She says that she has been itching more for the past 2 weeks. The patient was noted to have elevated BUN/Cr, BNP>4500.  She had some clinical findings of congestive heart failure although her chest xray did not show pulmonary edema.  Her troponin was mildly elevated at 0.05.  She was given 40 mg IVP lasix and admission was requested.   MDM/Assessment & Plan:   1. Mild acute systolic CHF exacerbation - there is not visible evidence of pulmonary edema on chest xray, she does have some crackles at the bases on exam.  She was ordered for lasix 40 mg IVP in ED. Will start oral lasix tomorrow.  Monitor weight, I/Os, electrolytes. Obtain 2D echocardiogram to assess heart function.  I am concerned that her cardiomyopathy is worsening particularly if she did have a mild heart attack 3 weeks ago.  I am trying to obtain old records from recent hospitalization in [place].  2. CAD - Pt is only taking nitroglycerin as needed for chest pain.  Due to her advance age and co-morbidities, she had been slowly taken off her cardiac medications for safety reasons, fall risk, etc.  Will cycle troponin. Cardiology is following. 3. LBBB - this is a chronic finding noted on many older EKGs.   4. Type 2 DM - this is diet controlled and she no longer takes medications for this. Will provide carb modified diet.  5. Essential hypertension - she is no longer on blood pressure medications, will follow.  Cardiology recommending starting beta-blocker prior to discharge. 6. CKD stage 4 - Worsening CKD may unfortunately be causing her symptoms of nausea and  pruritus.    She likely is developing cardiorenal syndrome.  I have asked for a nephrology consultation.  I suspect that he creatinine is much worse but not reflected in lab value because of her small stature and small amount of muscle mass.  I am not sure that at her advanced age that she would be a dialysis candidate.  I did speak with her about this. She tells me that she wants to have everything done.   7. Gait instability - fall precautions ordered and PT evaluation requested.  8. Code Status: I spoke with patient and I spoke with her POA Whitney Muse and she wishes to remain FULL CODE at this time.    DVT Prophylaxis: enoxaparin Code Status: FULL   Family Communication: POA (telephone)  Disposition Plan: TBD   Subjective: Patient says that her pruritus improved for several hours yesterday but then returned.  She says that she received some relief from the Vistaril that was given yesterday.  Objective: Vitals:   10/15/17 1708 10/15/17 1954 10/15/17 2116 10/16/17 0433  BP: 122/83  109/68 114/65  Pulse: 95 (!) 104 90 76  Resp: 19 20 20 19   Temp: 97.9 F (36.6 C)  98.7 F (37.1 C) 98.2 F (36.8 C)  TempSrc: Oral  Oral Oral  SpO2: 94% 98% 99% 100%  Weight: 49 kg (108 lb 0.4 oz)   48.5 kg (106 lb 14.8 oz)  Height: 5\' 3"  (1.6 m)  Intake/Output Summary (Last 24 hours) at 10/16/2017 1137 Last data filed at 10/16/2017 0900 Gross per 24 hour  Intake 1100 ml  Output 600 ml  Net 500 ml   Filed Weights   10/15/17 0843 10/15/17 1708 10/16/17 0433  Weight: 49.4 kg (109 lb) 49 kg (108 lb 0.4 oz) 48.5 kg (106 lb 14.8 oz)   REVIEW OF SYSTEMS  As per history otherwise all reviewed and reported negative  Exam:  General exam: Elderly female, lying in bed, awake, in no distress, cooperative.  Alert and oriented. Respiratory system: No crackles heard. No increased work of breathing. Cardiovascular system: S1 & S2 heard,. Gastrointestinal system: Abdomen is nondistended, soft and  nontender. Normal bowel sounds heard. Central nervous system: Alert and oriented. No focal neurological deficits. Extremities: no CCE.  Data Reviewed: Basic Metabolic Panel: Recent Labs  Lab 10/15/17 0926 10/16/17 0408  NA 139 138  K 5.0 5.2*  CL 105 106  CO2 21* 24  GLUCOSE 126* 105*  BUN 42* 45*  CREATININE 1.69* 1.96*  CALCIUM 10.1 9.6  MG  --  1.6*  PHOS  --  3.6   Liver Function Tests: Recent Labs  Lab 10/15/17 0926  AST 30  ALT 27  ALKPHOS 117  BILITOT 1.0  PROT 6.9  ALBUMIN 4.1   No results for input(s): LIPASE, AMYLASE in the last 168 hours. No results for input(s): AMMONIA in the last 168 hours. CBC: Recent Labs  Lab 10/15/17 0926 10/16/17 0408  WBC 6.8 5.3  NEUTROABS 5.2 2.7  HGB 11.9* 10.8*  HCT 38.4 34.8*  MCV 90.6 89.7  PLT 189 SPECIMEN CHECKED FOR CLOTS   Cardiac Enzymes: Recent Labs  Lab 10/15/17 0926 10/15/17 1602 10/15/17 2230 10/16/17 0408  TROPONINI 0.05* 0.05* 0.05* 0.05*   CBG (last 3)  No results for input(s): GLUCAP in the last 72 hours. No results found for this or any previous visit (from the past 240 hour(s)).   Studies: Dg Chest 2 View  Result Date: 10/15/2017 CLINICAL DATA:  Shortness of Breath EXAM: CHEST  2 VIEW COMPARISON:  September 26, 2017 FINDINGS: There is no appreciable edema or consolidation. There is atelectatic change in left base. There is cardiomegaly with pulmonary vascularity within normal limits. There is aortic atherosclerosis. Bones are osteoporotic. There is degenerative change in the thoracic spine and in each shoulder. IMPRESSION: Cardiomegaly. Left base atelectasis. No edema or consolidation. There is aortic atherosclerosis. Aortic Atherosclerosis (ICD10-I70.0). Electronically Signed   By: Bretta Bang III M.D.   On: 10/15/2017 10:18   Dg Chest Port 1 View  Result Date: 10/16/2017 CLINICAL DATA:  Acute exacerbation of congestive heart failure. Cardiomyopathy. Diabetes. Hypertension. History of  pulmonary embolus. Nonsmoker. EXAM: PORTABLE CHEST 1 VIEW COMPARISON:  10/15/2017 FINDINGS: Cardiac enlargement with pulmonary vascular congestion. Interstitial infiltration likely edema. No pleural effusions. No pneumothorax. No focal consolidation. Calcification of the aorta. Degenerative changes in the shoulders. IMPRESSION: Cardiac enlargement with pulmonary vascular congestion and mild interstitial edema. Mild progression since previous study. Electronically Signed   By: Burman Nieves M.D.   On: 10/16/2017 06:17   Scheduled Meds: . aspirin EC  81 mg Oral Daily  . enoxaparin (LOVENOX) injection  30 mg Subcutaneous Q24H  . furosemide  40 mg Intravenous Q12H   Continuous Infusions:  Principal Problem:   Acute exacerbation of CHF (congestive heart failure) (HCC) Active Problems:   Hyperlipidemia   Dysthymic disorder   Essential hypertension   Left bundle branch block (LBBB)   OVERACTIVE  BLADDER   Arthropathy   Difficulty walking   Type II diabetes mellitus (HCC)   Chest pain   Nonischemic cardiomyopathy (HCC)   Coronary artery disease   Time spent:   Standley Dakins, MD, FAAFP [facility] Pager [phone]  If 7PM-7AM, please contact night-coverage [email or web address] Password [facility] 10/16/2017, 11:37 AM    LOS: 1 day

## 2017-10-16 NOTE — Plan of Care (Signed)
  Clinical Measurements: Respiratory complications will improve 10/16/2017 1623 - Progressing by Wynne Dust, RN   Activity: Risk for activity intolerance will decrease 10/16/2017 1623 - Progressing by Wynne Dust, RN   Nutrition: Adequate nutrition will be maintained 10/16/2017 1623 - Progressing by Wynne Dust, RN   Elimination: Will not experience complications related to urinary retention 10/16/2017 1623 - Progressing by Wynne Dust, RN   Pain Managment: General experience of comfort will improve 10/16/2017 1623 - Progressing by Wynne Dust, RN

## 2017-10-16 NOTE — Consult Note (Signed)
[name] [name] MRN: [id number] DOB/AGE: 1924/07/19 84 y.o. Primary Care Physician:Shah, Beatrix Fetters, MD Admit date: 10/15/2017 Chief Complaint:  Chief Complaint  Patient presents with  . Nausea   HPI: Pt is a 84 year old  female who presented to  emergency department with c/o of itching.  HPI dates back to  2 weeks when pt started having itching ,its intermittent, diffuse and pt says " I have seen  my PCP and a dermatologist." Pt also c/o . shortness of breath at some point in the last couple weeks.  Pt also c/o  nausea with it but no vomiting.  Pt offers no c/o LE swelling. NO c/o  or change in her appetite.  No diarrhea or constipation.   She has not noticed any rashes.  No c/o fever/cough/chills NO c/o syncope    Past Medical History:  Diagnosis Date  . Arthritis   . Coronary artery disease    Cath 2004--60% LAD--diffuse and calcified, 40% PDA  . Depression   . Hyperlipidemia   . Hypertension   . Nonischemic cardiomyopathy (HCC)    EF 30% 2004, last echo 10/2006 EF=60-65%  . Overactive bladder   . Pulmonary embolism (HCC)    status post  . Type II diabetes mellitus (HCC)         Family History  Problem Relation Age of Onset  . Stroke Mother        deceased    Social History:  reports that  has never smoked. she has never used smokeless tobacco. She reports that she does not drink alcohol or use drugs.   Allergies: No Known Allergies  Medications Prior to Admission  Medication Sig Dispense Refill  . nitroGLYCERIN (NITROSTAT) 0.4 MG SL tablet Place 0.4 mg under the tongue every 5 (five) minutes as needed.         YEL:YHTMB from the symptoms mentioned above,there are no other symptoms referable to all systems reviewed.  Marland Kitchen aspirin EC  81 mg Oral Daily  . enoxaparin (LOVENOX) injection  30 mg Subcutaneous Q24H  . furosemide  40 mg Intravenous Q12H  . potassium chloride  30 mEq Oral BID       Physical Exam: Vital signs in last 24 hours: Temp:  [97.7 F  (36.5 C)-98.7 F (37.1 C)] 98.2 F (36.8 C) (01/21 0433) Pulse Rate:  [67-104] 76 (01/21 0433) Resp:  [7-35] 19 (01/21 0433) BP: (103-131)/(63-90) 114/65 (01/21 0433) SpO2:  [94 %-100 %] 100 % (01/21 0433) Weight:  [106 lb 14.8 oz (48.5 kg)-109 lb (49.4 kg)] 106 lb 14.8 oz (48.5 kg) (01/21 0433) Weight change:  Last BM Date: 10/14/17  Intake/Output from previous day: 01/20 0701 - 01/21 0700 In: 860 [P.O.:360; IV Piggyback:500] Out: 350 [Urine:350] No intake/output data recorded.   Physical Exam: General- pt is awake,alert, oriented to time place and person Resp- No acute REsp distress,  NO Rhonchi CVS- S1S2 regular in rate and rhythm GIT- BS+, soft, NT, ND EXT- NO LE Edema, Cyanosis CNS- CN 2-12 grossly intact. Moving all 4 extremities Psych- normal mood and affect    Lab Results: CBC Recent Labs    10/15/17 0926 10/16/17 0408  WBC 6.8 5.3  HGB 11.9* 10.8*  HCT 38.4 34.8*  PLT 189 SPECIMEN CHECKED FOR CLOTS    BMET Recent Labs    10/15/17 0926 10/16/17 0408  NA 139 138  K 5.0 5.2*  CL 105 106  CO2 21* 24  GLUCOSE 126* 105*  BUN 42* 45*  CREATININE  1.69* 1.96*  CALCIUM 10.1 9.6    Creat trend 2019   1.69--1.96 2018   1.1--1.5 2017    1.46 2009     1.29  MICRO No results found for this or any previous visit (from the past 240 hour(s)).    Lab Results  Component Value Date   CALCIUM 9.6 10/16/2017   CAION 1.22 02/08/2012   PHOS 3.6 10/16/2017      Impression: 1)Renal  AKI secondary to Cardiorenal                AKI on CKD               CKD stage 4.               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                 Hematuria none.                Nephrolithiasis Hx Absent   2)HTN  Medication- On Diuretics  3)Anemia HGb at goal (9--11)   4)CKD Mineral-Bone Disorder  Phosphorus  at goal. Calcium is at goal.  5)CHF-admited with CHF PMD following  6)Electrolytes  hyperkalemic   On diuretics  NOrmonatremic  7)Acid base Co2 at goal  8) Itching- unlikely to be secondary to CKD as pt phosphorus is within normal limits    Plan:   Will ask for renal u/s. Will ask for FENA. Agree with asking for Cardiology help. Will ask for proteinuria work up. Agree with diuresis.   Catarino Vold S 10/16/2017, 7:42 AM

## 2017-10-16 NOTE — Consult Note (Signed)
Cardiology Consult    Patient ID: [name] [name]; [id number]; 06/12/1924   Admit date: 10/15/2017 Date of Consult: 10/16/2017  Primary Care Provider: Kirstie Peri, MD Primary Cardiologist: [name] [name]  Patient Profile    [name] [name] is a 84 y.o. female with past medical history of CAD (diffuse nonobstructive CAD by cath in 2004), nonischemic cardiomyopathy (EF 30% in 2004, normalized in the interim but down to 15-20% by echo in 06/2017), known LBBB, HTN, HLD, CVA (occurring in 2017) and Stage 3-4 CKD who is being seen today for the evaluation of CHF at the request of [name] [name].   History of Present Illness    [name] [name] was admitted to [facility] in 05/2016 for evaluation of chest pain at which time cardiac enzymes remained negative and EKG showed no acute ischemic changes. [name] [name] followed the patient during admission and her pain was thought to be atypical as it was reproducible with palpation, therefore further cardiac evaluation was not pursued.   Was most recently admitted to [facility] in 06/2017 for HCAP. A repeat echo was obtained and showed a newly reduced EF of 15-20% with no regional WMA and moderate MR.   Presented back to [facility] ED on 10/15/2017 for evaluation of worsening itching over the past few weeks. Also reported worsening dyspnea over the past few weeks. In talking with the patient, she reports being admitted at [facility] for dyspnea on exertion and developed itching during her admission. She was started on "14 medications" at the time of discharge and quit taking these in the interim due to not thinking she needed them and also thinking one of them might be contributing to her symptoms. Was also evaluated by Dermatology as an outpatient and did not experience any improvement in her symptoms with topical medications. She has baseline dyspnea on exertion which is noted when walking to her mailbox or around her home but denies any associated chest pain,  palpitations, orthopnea, PND, or lower extremity edema.   We review her history of the known cardiomyopathy but she reports "having never been told of this". We reviewed her prior catheterization report and she again said "she never had a cath and was always told her heart worked fine".  Initial labs showed WBC 6.8, Hgb 11.9, platelets 189, Na+ 139, K+ 5.0, and creatinine 1.69 (previously 1.39 in 06/2017), and BNP > 4500. Cyclic troponin values have been flat at 0.05. CXR shows cardiac enlargement with pulmonary vascular congestion and mild interstitial edema. EKG shows NSR, HR 94, with known LBBB.   She was admitted and started on IV Lasix 40mg  BID. I&O's have not been recorded. Creatinine trending upwards to 1.96 today and Nephrology is following.    Past Medical History:  Diagnosis Date  . Arthritis   . Coronary artery disease    Cath 2004--60% LAD--diffuse and calcified, 40% PDA  . Depression   . Hyperlipidemia   . Hypertension   . Nonischemic cardiomyopathy (HCC)    a. EF 30% 2004 b. 10/2006 EF=60-65% c. 06/2017: EF 15-20%, no regional WMA, Grade 1 DD, moderate MR  . Overactive bladder   . Pulmonary embolism (HCC)    status post  . Type II diabetes mellitus (HCC)     Past Surgical History:  Procedure Laterality Date  . APPENDECTOMY  1959  . BACK SURGERY  1967; 1977  . CATARACT EXTRACTION W/ INTRAOCULAR LENS  IMPLANT, BILATERAL  2008   right eye  . CHOLECYSTECTOMY  1959  .  TONSILLECTOMY       Home Medications:  Prior to Admission medications   Medication Sig Start Date End Date Taking? Authorizing Provider  nitroGLYCERIN (NITROSTAT) 0.4 MG SL tablet Place 0.4 mg under the tongue every 5 (five) minutes as needed.    [provider]    Inpatient Medications: Scheduled Meds: . aspirin EC  81 mg Oral Daily  . enoxaparin (LOVENOX) injection  30 mg Subcutaneous Q24H  . furosemide  40 mg Intravenous Q12H  . sodium polystyrene  15 g Oral Once   Continuous  Infusions:  PRN Meds: acetaminophen, guaiFENesin-dextromethorphan, hydrocortisone, hydrocortisone cream, hydrOXYzine, lip balm, MUSCLE RUB, nitroGLYCERIN, phenol, polyvinyl alcohol, sodium chloride  Allergies:   No Known Allergies  Social History:   Social History   Socioeconomic History  . Marital status: Widowed    Spouse name: Not on file  . Number of children: 2  . Years of education: Not on file  . Highest education level: Not on file  Social Needs  . Financial resource strain: Not on file  . Food insecurity - worry: Not on file  . Food insecurity - inability: Not on file  . Transportation needs - medical: Not on file  . Transportation needs - non-medical: Not on file  Occupational History  . Occupation: retired  Tobacco Use  . Smoking status: Never Smoker  . Smokeless tobacco: Never Used  Substance and Sexual Activity  . Alcohol use: No  . Drug use: No  . Sexual activity: No  Other Topics Concern  . Not on file  Social History Narrative  . Not on file     Family History:    Family History  Problem Relation Age of Onset  . Stroke Mother        deceased      Review of Systems    General:  No chills, fever, night sweats or weight changes. Positive for itching.  Cardiovascular:  No chest pain, edema, orthopnea, palpitations, paroxysmal nocturnal dyspnea. Positive for dyspnea on exertion.  Dermatological: No rash, lesions/masses Respiratory: No cough, dyspnea Urologic: No hematuria, dysuria Abdominal:   No nausea, vomiting, diarrhea, bright red blood per rectum, melena, or hematemesis Neurologic:  No visual changes, wkns, changes in mental status. All other systems reviewed and are otherwise negative except as noted above.  Physical Exam/Data    Vitals:   10/15/17 1708 10/15/17 1954 10/15/17 2116 10/16/17 0433  BP: 122/83  109/68 114/65  Pulse: 95 (!) 104 90 76  Resp: 19 20 20 19   Temp: 97.9 F (36.6 C)  98.7 F (37.1 C) 98.2 F (36.8 C)  TempSrc:  Oral  Oral Oral  SpO2: 94% 98% 99% 100%  Weight: 108 lb 0.4 oz (49 kg)   106 lb 14.8 oz (48.5 kg)  Height: 5\' 3"  (1.6 m)       Intake/Output Summary (Last 24 hours) at 10/16/2017 1048 Last data filed at 10/16/2017 0900 Gross per 24 hour  Intake 1100 ml  Output 600 ml  Net 500 ml   Filed Weights   10/15/17 0843 10/15/17 1708 10/16/17 0433  Weight: 109 lb (49.4 kg) 108 lb 0.4 oz (49 kg) 106 lb 14.8 oz (48.5 kg)   Body mass index is 18.94 kg/m.   General: Pleasant elderly Caucasian female appearing in NAD Psych: Normal affect. Neuro: Alert and oriented X 3. Moves all extremities spontaneously. HEENT: Normal  Neck: Supple without bruits. JVD at 9cm. Lungs:  Resp regular and unlabored, mild rales along bases bilaterally.  Heart: RRR no s3, s4, 2/6 SEM along Apex. Abdomen: Soft, non-tender, non-distended, BS + x 4.  Extremities: No clubbing, cyanosis or edema. DP/PT/Radials 2+ and equal bilaterally.   EKG:  The EKG was personally reviewed and demonstrates:  NSR, HR 94, with known LBBB.   Telemetry:  Telemetry was personally reviewed and demonstrates: NSR, HR in 70's to 90's with frequent PVC's.    Labs/Studies     Relevant CV Studies:  Echocardiogram: 06/2017 Study Conclusions  - Left ventricle: The cavity size was normal. Systolic function was   normal. The estimated ejection fraction was in the range of 15%   to 20%. Wall motion was normal; there were no regional wall   motion abnormalities. Doppler parameters are consistent with   abnormal left ventricular relaxation (grade 1 diastolic   dysfunction). - Ventricular septum: Septal motion showed moderate dyssynergy.   These changes are consistent with a left bundle [name] [name] block. - Mitral valve: There was moderate regurgitation. - Left atrium: The atrium was mildly dilated. - Pulmonary arteries: Systolic pressure was mildly increased. PA   peak pressure: 35 mm Hg (S). - Pericardium, extracardiac: A small pericardial  effusion was   identified circumferential to the heart.  Cardiac Catheterization: 12/2002  SELECTIVE CORONARY ANGIOGRAPHY:  1. The left main coronary artery was short and there were near separate     ostia of the LAD and circumflex coronary artery.  2. The left anterior descending artery is a moderate-sized vessel.  There     was a diffuse and calcified 60% stenosis following the takeoff of the     second diagonal Paw Karstens.  The remainder of the LAD was free of flow-     limiting lesions.  The first and second diagonal branches were also free     of flow-limiting lesions.  3. Circumflex coronary artery was a large-caliber vessel providing a large     first and second obtuse marginal which were free of flow-limiting     disease.  4. The right coronary artery again was a large-caliber vessel and the     circulation was right-dominant.  5. The posterior descending artery had a diffuse tight 40% stenosis but the     remainder of the right circulation was free of flow-limiting lesions.   IMPRESSION AND RECOMMENDATION:  The patient appears to have predominantly a  nonischemic cardiomyopathy.  The coronary artery disease is out of  proportion to the degree of her left ventricular dysfunction.   Cardinal symptoms are shortness of breath.  She will need aggressive medical  treatment of her nonischemic cardiomyopathy and would include ACE inhibitor  and beta blocker therapy.  Unless the patient has recurrent substernal chest  pain which is typical for angina, one could consider intervention to the  left anterior descending, but it is my feeling that her symptoms of chest  pain are rather atypical.  Furthermore, the patient was not on an aggressive  medical regimen on initial evaluation.  I will follow the patient closely in  [place] and will maximize her secondary prevention therapy with higher-dose  statin therapy.  Laboratory Data:  Chemistry Recent Labs  Lab 10/15/17 0926 10/16/17 0408    NA 139 138  K 5.0 5.2*  CL 105 106  CO2 21* 24  GLUCOSE 126* 105*  BUN 42* 45*  CREATININE 1.69* 1.96*  CALCIUM 10.1 9.6  GFRNONAA 25* 21*  GFRAA 29* 25*  ANIONGAP 13 8    Recent Labs  Lab 10/15/17 0926  PROT 6.9  ALBUMIN 4.1  AST 30  ALT 27  ALKPHOS 117  BILITOT 1.0   Hematology Recent Labs  Lab 10/15/17 0926 10/16/17 0408  WBC 6.8 5.3  RBC 4.24 3.88  HGB 11.9* 10.8*  HCT 38.4 34.8*  MCV 90.6 89.7  MCH 28.1 27.8  MCHC 31.0 31.0  RDW 13.6 13.3  PLT 189 SPECIMEN CHECKED FOR CLOTS   Cardiac Enzymes Recent Labs  Lab 10/15/17 0926 10/15/17 1602 10/15/17 2230 10/16/17 0408  TROPONINI 0.05* 0.05* 0.05* 0.05*   No results for input(s): TROPIPOC in the last 168 hours.  BNP Recent Labs  Lab 10/15/17 0926 10/16/17 0408  BNP >4,500.0* 4,481.0*    DDimer No results for input(s): DDIMER in the last 168 hours.  Radiology/Studies:  Dg Chest 2 View  Result Date: 10/15/2017 CLINICAL DATA:  Shortness of Breath EXAM: CHEST  2 VIEW COMPARISON:  September 26, 2017 FINDINGS: There is no appreciable edema or consolidation. There is atelectatic change in left base. There is cardiomegaly with pulmonary vascularity within normal limits. There is aortic atherosclerosis. Bones are osteoporotic. There is degenerative change in the thoracic spine and in each shoulder. IMPRESSION: Cardiomegaly. Left base atelectasis. No edema or consolidation. There is aortic atherosclerosis. Aortic Atherosclerosis (ICD10-I70.0). Electronically Signed   By: Bretta Bang III M.D.   On: 10/15/2017 10:18   Dg Chest Port 1 View  Result Date: 10/16/2017 CLINICAL DATA:  Acute exacerbation of congestive heart failure. Cardiomyopathy. Diabetes. Hypertension. History of pulmonary embolus. Nonsmoker. EXAM: PORTABLE CHEST 1 VIEW COMPARISON:  10/15/2017 FINDINGS: Cardiac enlargement with pulmonary vascular congestion. Interstitial infiltration likely edema. No pleural effusions. No pneumothorax. No focal  consolidation. Calcification of the aorta. Degenerative changes in the shoulders. IMPRESSION: Cardiac enlargement with pulmonary vascular congestion and mild interstitial edema. Mild progression since previous study. Electronically Signed   By: Burman Nieves M.D.   On: 10/16/2017 06:17     Assessment & Plan    1. Acute on Chronic Combined Systolic and Diastolic CHF - the patient has a history of nonischemic cardiomyopathy with EF at 30% in 2004, normalized in the interim but down to 15-20% by echo in 06/2017. As discussed above, she reports being unaware of her reduced EF and does not recall having her catheterization in 2004. - she presented for evaluation of itching but also reported worsening dyspnea and was found to have a BNP > 4500 and CXR showing pulmonary vascular congestion and mild interstitial edema. Denies any chest pain, orthopnea, PND, or lower extremity edema. Repeat Complete Echo had been ordered but this was just performed less than 3 months ago. I have updated this to a limited study.   - she has been started on IV Lasix 40mg  BID. I&O's have not been recorded due to incontinence (catheter now in place). Creatinine trending upwards to 1.96 today and Nephrology is following. Continue with IV diuresis.  - would recommend the addition of a BB (Toprol-XL or Coreg) prior to discharge in the setting of her cardiomyopathy as she was likely taking one of these PTA but quit taking all of her medications abruptly. No ACE-I/ARB/ARNI at this time due to her AKI.   2. CAD/ Elevated Troponin - the patient has known diffuse nonobstructive CAD by cath in 2004 with 60% LAD stenosis noted at that time which has likely progressed over the past 15 years. EKG shows known LBBB.  - cyclic enzymes have been flat at 0.05, likely secondary to demand ischemia in the setting of her CHF  exacerbation and AKI.  - she denies any recent chest pain. Has baseline dyspnea on exertion. Would favor medical therapy at  this time due to her advanced age and worsening kidney injury.   3. Acute on Chronic Stage 3-4 CKD - creatinine at 1.69 on admission, previously 1.39 in 06/2017. - elevated to 1.96 this AM. Nephrology following.   4. Mitral Regurgitation - moderate by recent echo. Continue to follow as an outpatient.   5. Anemia of Chronic Disease - she denies any evidence of active bleeding. Hgb stable at 10.8.  6. Pruritus - has been occurring for the past 2-3 weeks.  - was given an IV medication in the ED yesterday which she says fully relieved her symptoms (IV Zofran and PO Vistaril given at the same time). Requesting further medication to relieve her symptoms. - per admitting team.     For questions or updates, please contact [facility] Please consult [email or web address] for contact info under Cardiology/STEMI.  Signed, Ellsworth Lennox, PA-C 10/16/2017, 10:48 AM Pager: [phone]  Attending note Patient seen and discussed with PA Iran Ouch, I agree with her documentation above. 84 yo female with history of nonobstructive CAD, LV dysfunction that at some point normalized according to Jan 2011 [facility] clinic note., but as of 06/2017 had decreased to 15-20% , HTN, Hl, DM2, hx of cardioembolic CVA s/p tpa 08/2016, CKD IV admitted with pruritus. Found to be in CHF, cardiology consulted. H&P mentions some of her cardiac meds had been weaned due to advanced age, side effect risk, and fall risk.     K 5, Cr 1.69, GFR 25, >4500, TSH 1.6 Mg 1.6  Trop 0.05 -->0.05-->0.05-->0.05--> CXR no edema EKG SR, chronic LBBB  Patient presents with acute on chronic systolic HF. From notes appears low LVEF several years ago, as of 2011 it normalized, and by most recent echo 06/2017 LVEF 15-20%. The exact timing of this drop is unclear, does not appear she had a repeat ischemic evaluation around the time this was discovered. Given her advanced age and poor renal function she is a poor cath candidate. She reports she  was just on too many medicines at home and just stop taking them on her own, to me she denies any specific side effects. We will start toprol XL 12.5mg  daily, effors to limit her regimen due to patient preference, renal function, and advanced age/side effect risks. Contiue IV diuresis, reassess volume status and renal function tomorrow   Dina Rich MD

## 2017-10-17 ENCOUNTER — Inpatient Hospital Stay (HOSPITAL_COMMUNITY): Payer: Medicare Other

## 2017-10-17 DIAGNOSIS — I361 Nonrheumatic tricuspid (valve) insufficiency: Secondary | ICD-10-CM

## 2017-10-17 LAB — PROTEIN ELECTROPHORESIS, SERUM
A/G Ratio: 1.4 (ref 0.7–1.7)
Albumin ELP: 3.4 g/dL (ref 2.9–4.4)
Alpha-1-Globulin: 0.2 g/dL (ref 0.0–0.4)
Alpha-2-Globulin: 0.6 g/dL (ref 0.4–1.0)
Beta Globulin: 0.8 g/dL (ref 0.7–1.3)
Gamma Globulin: 0.7 g/dL (ref 0.4–1.8)
Globulin, Total: 2.4 g/dL (ref 2.2–3.9)
Total Protein ELP: 5.8 g/dL — ABNORMAL LOW (ref 6.0–8.5)

## 2017-10-17 LAB — ECHOCARDIOGRAM LIMITED
CHL CUP MV DEC (S): 215
CHL CUP STROKE VOLUME: 35 mL
CHL CUP TV REG PEAK VELOCITY: 258 cm/s
EERAT: 27.57
EWDT: 215 ms
FS: 5 % — AB (ref 28–44)
HEIGHTINCHES: 63 in
IVS/LV PW RATIO, ED: 0.8
LA diam index: 3.42 cm/m2
LA vol A4C: 29 ml
LASIZE: 50 mm
LDCA: 2.84 cm2
LEFT ATRIUM END SYS DIAM: 50 mm
LV E/e' medial: 27.57
LV PW d: 12 mm — AB (ref 0.6–1.1)
LV SIMPSON'S DISK: 27
LV e' LATERAL: 3.99 cm/s
LV sys vol index: 65 mL/m2
LV sys vol: 95 mL — AB (ref 14–42)
LVDIAVOL: 130 mL — AB (ref 46–106)
LVDIAVOLIN: 89 mL/m2
LVEEAVG: 27.57
LVOTD: 19 mm
MV pk E vel: 110 m/s
MVPG: 5 mmHg
MVPKAVEL: 107 m/s
TDI e' lateral: 3.99
TDI e' medial: 3.09
TR max vel: 258 cm/s
VTI: 135 cm
WEIGHTICAEL: 1650.8 [oz_av]

## 2017-10-17 LAB — BASIC METABOLIC PANEL
ANION GAP: 14 (ref 5–15)
BUN: 43 mg/dL — ABNORMAL HIGH (ref 6–20)
CO2: 24 mmol/L (ref 22–32)
Calcium: 9.2 mg/dL (ref 8.9–10.3)
Chloride: 102 mmol/L (ref 101–111)
Creatinine, Ser: 2 mg/dL — ABNORMAL HIGH (ref 0.44–1.00)
GFR, EST AFRICAN AMERICAN: 24 mL/min — AB (ref >60)
GFR, EST NON AFRICAN AMERICAN: 21 mL/min — AB (ref >60)
GLUCOSE: 132 mg/dL — AB (ref 65–99)
POTASSIUM: 4 mmol/L (ref 3.5–5.1)
Sodium: 140 mmol/L (ref 135–145)

## 2017-10-17 LAB — CREATININE, URINE, 24 HOUR
Collection Interval-UCRE24: 24 hours
Creatinine, 24H Ur: 318 mg/d — ABNORMAL LOW (ref 600–1800)
Creatinine, Urine: 27.67 mg/dL
Urine Total Volume-UCRE24: 1150 mL

## 2017-10-17 LAB — SODIUM, URINE, RANDOM: Sodium, Ur: 134 mmol/L

## 2017-10-17 LAB — BRAIN NATRIURETIC PEPTIDE: B NATRIURETIC PEPTIDE 5: 2505 pg/mL — AB (ref 0.0–100.0)

## 2017-10-17 LAB — MAGNESIUM: Magnesium: 1.5 mg/dL — ABNORMAL LOW (ref 1.7–2.4)

## 2017-10-17 LAB — PROTEIN, URINE, RANDOM: Total Protein, Urine: <6 mg/dL

## 2017-10-17 LAB — CREATININE, URINE, RANDOM: Creatinine, Urine: 26.81 mg/dL

## 2017-10-17 MED ORDER — MAGNESIUM SULFATE 2 GM/50ML IV SOLN
2.0000 g | Freq: Once | INTRAVENOUS | Status: AC
  Administered 2017-10-17: 2 g via INTRAVENOUS
  Filled 2017-10-17: qty 50

## 2017-10-17 MED ORDER — METOPROLOL SUCCINATE ER 25 MG PO TB24
12.5000 mg | ORAL_TABLET | Freq: Every day | ORAL | Status: DC
  Administered 2017-10-17 – 2017-10-18 (×2): 12.5 mg via ORAL
  Filled 2017-10-17 (×2): qty 1

## 2017-10-17 MED ORDER — FUROSEMIDE 20 MG PO TABS
20.0000 mg | ORAL_TABLET | Freq: Every day | ORAL | Status: DC
  Administered 2017-10-18: 20 mg via ORAL
  Filled 2017-10-17 (×2): qty 1

## 2017-10-17 NOTE — Progress Notes (Signed)
*  PRELIMINARY RESULTS* Echocardiogram 2D Echocardiogram LIMITED has been performed.  [name] [name] 10/17/2017, 11:06 AM

## 2017-10-17 NOTE — Plan of Care (Signed)
  Clinical Measurements: Respiratory complications will improve 10/17/2017 0803 - Progressing by Wynne Dust, RN   Activity: Risk for activity intolerance will decrease 10/17/2017 0803 - Progressing by Wynne Dust, RN   Nutrition: Adequate nutrition will be maintained 10/17/2017 0803 - Progressing by Wynne Dust, RN   Elimination: Will not experience complications related to bowel motility 10/17/2017 0803 - Progressing by Wynne Dust, RN   Elimination: Will not experience complications related to urinary retention 10/17/2017 0803 - Progressing by Wynne Dust, RN   Safety: Ability to remain free from injury will improve 10/17/2017 0803 - Progressing by Wynne Dust, RN

## 2017-10-17 NOTE — Plan of Care (Signed)
24 hour urine started at 10/16/2017 2330. Will continue to monitor.

## 2017-10-17 NOTE — Progress Notes (Signed)
Subjective: Interval History: has no complaint of difficulty in breathing.  Patient does state that she is feeling much better.  She denies any cough..  Objective: Vital signs in last 24 hours: Temp:  [98 F (36.7 C)-98.6 F (37 C)] 98.5 F (36.9 C) (01/22 0543) Pulse Rate:  [74-92] 92 (01/22 0543) Resp:  [15-20] 15 (01/22 0543) BP: (100-117)/(53-73) 109/73 (01/22 0543) SpO2:  [96 %-100 %] 100 % (01/22 0543) Weight:  [46.8 kg (103 lb 2.8 oz)] 46.8 kg (103 lb 2.8 oz) (01/22 0543) Weight change: -2.642 kg (-13.2 oz)  Intake/Output from previous day: 01/21 0701 - 01/22 0700 In: 480 [P.O.:480] Out: 800 [Urine:800] Intake/Output this shift: No intake/output data recorded.  General appearance: alert, cooperative and no distress Resp: diminished breath sounds bilaterally Cardio: regular rate and rhythm Extremities: No edema  Lab Results: Recent Labs    10/15/17 0926 10/16/17 0408  WBC 6.8 5.3  HGB 11.9* 10.8*  HCT 38.4 34.8*  PLT 189 SPECIMEN CHECKED FOR CLOTS   BMET:  Recent Labs    10/15/17 0926 10/16/17 0408  NA 139 138  K 5.0 5.2*  CL 105 106  CO2 21* 24  GLUCOSE 126* 105*  BUN 42* 45*  CREATININE 1.69* 1.96*  CALCIUM 10.1 9.6   No results for input(s): PTH in the last 72 hours. Iron Studies: No results for input(s): IRON, TIBC, TRANSFERRIN, FERRITIN in the last 72 hours.  Studies/Results: Dg Chest 2 View  Result Date: 10/15/2017 CLINICAL DATA:  Shortness of Breath EXAM: CHEST  2 VIEW COMPARISON:  September 26, 2017 FINDINGS: There is no appreciable edema or consolidation. There is atelectatic change in left base. There is cardiomegaly with pulmonary vascularity within normal limits. There is aortic atherosclerosis. Bones are osteoporotic. There is degenerative change in the thoracic spine and in each shoulder. IMPRESSION: Cardiomegaly. Left base atelectasis. No edema or consolidation. There is aortic atherosclerosis. Aortic Atherosclerosis (ICD10-I70.0).  Electronically Signed   By: Bretta Bang III M.D.   On: 10/15/2017 10:18   US Renal  Result Date: 10/16/2017 CLINICAL DATA:  Acute renal injury EXAM: RENAL / URINARY TRACT ULTRASOUND COMPLETE COMPARISON:  CT from 05/09/2017 FINDINGS: Right Kidney: Length: 10.8 cm. No hydronephrosis is noted. A 3.6 cm cyst is noted similar to that seen on prior CT. No calculi are seen. Left Kidney: Atrophic in nature measuring 8 cm. This is similar to that seen on prior CT examination. No hydronephrosis is noted. Bladder: Appears normal for degree of bladder distention. IMPRESSION: Atrophic left kidney. Stable right renal cyst. No acute abnormality noted. Electronically Signed   By: Alcide Clever M.D.   On: 10/16/2017 16:49   Dg Chest Port 1 View  Result Date: 10/16/2017 CLINICAL DATA:  Acute exacerbation of congestive heart failure. Cardiomyopathy. Diabetes. Hypertension. History of pulmonary embolus. Nonsmoker. EXAM: PORTABLE CHEST 1 VIEW COMPARISON:  10/15/2017 FINDINGS: Cardiac enlargement with pulmonary vascular congestion. Interstitial infiltration likely edema. No pleural effusions. No pneumothorax. No focal consolidation. Calcification of the aorta. Degenerative changes in the shoulders. IMPRESSION: Cardiac enlargement with pulmonary vascular congestion and mild interstitial edema. Mild progression since previous study. Electronically Signed   By: Burman Nieves M.D.   On: 10/16/2017 06:17    I have reviewed the patient's current medications.  Assessment/Plan: 1] acute kidney injury superimposed on chronic possibly secondary to cardiorenal/ATN.  Her creatinine is slightly higher than her baseline.  Patient however does not have any uremic signs and symptoms. 2] chronic renal failure: Stage IV 3] hypertension: Her  blood pressure is reasonably controlled 4] history of difficulty breathing.  Patient with history of nonischemic cardiomyopathy.  She is on Lasix and she had 800 cc of urine output and feeling  better. 5] history of PE 6] bone and mineral disorder: Her calcium  is range 7] history of diabetes  Plan: 1] we will continue his present management 2] we will check a renal panel in the morning  LOS: 2 days   Debroah Shuttleworth S 10/17/2017,8:36 AM

## 2017-10-17 NOTE — Progress Notes (Signed)
PROGRESS NOTE    [name] [name] [facility]  PNT:[id number]  DOB: 02-Aug-1924  DOA: 10/15/2017 PCP: Kirstie Peri, MD   Brief Admission Hx: [name] [name] is a 84 y.o. 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  She says that she has been itching more for the past 2 weeks. The patient was noted to have elevated BUN/Cr, BNP>4500.  She had some clinical findings of congestive heart failure although her chest xray did not show pulmonary edema.  Her troponin was mildly elevated at 0.05.  She was given 40 mg IVP lasix and admission was requested.   MDM/Assessment & Plan:   1. Mild acute systolic CHF exacerbation - there is not visible evidence of pulmonary edema on chest xray, she does have some crackles at the bases on exam.  She was ordered for lasix 40 mg IVP and has diuresed well. Cardiology has recommended starting oral diuretics 1/23.  Weight trending down. I/Os not accurate, following weight. 2D echocardiogram pending.    2. CAD - Stable, she is not a candidate for cath given her renal failure and advanced age.. 3. LBBB - this is a chronic finding noted on many older EKGs.   4. Type 2 DM - this is diet controlled and she no longer takes medications for this. Will provide carb modified diet.  5. Essential hypertension - she is no longer on blood pressure medications, will follow.  Cardiology recommending starting beta-blocker prior to discharge. 6. CKD stage 4 - Worsening CKD with diuresis.  Appreciate nephrology assistance.    Suspect cardiorenal syndrome contributing to the decline.  24 hour urine in progress. Renal function panel ordered for AM.     7. Gait instability - fall precautions ordered and PT evaluation requested.  8. Code Status: I spoke with patient and I spoke with her POA Whitney Muse and she wishes to remain  FULL CODE at this time.    DVT Prophylaxis: enoxaparin Code Status: FULL   Family Communication: POA (telephone)  Disposition Plan: Possi   Subjective: She has been diuresing and she does feel better today.  24 hour urine collection in process.   Objective: Vitals:   10/16/17 0433 10/16/17 1438 10/16/17 2300 10/17/17 0543  BP: 114/65 117/64 (!) 100/53 109/73  Pulse: 76 74 76 92  Resp: 19 20 18 15   Temp: 98.2 F (36.8 C) 98.6 F (37 C) 98 F (36.7 C) 98.5 F (36.9 C)  TempSrc: Oral Oral Oral Oral  SpO2: 100% 99% 96% 100%  Weight: 48.5 kg (106 lb 14.8 oz)   46.8 kg (103 lb 2.8 oz)  Height:        Intake/Output Summary (Last 24 hours) at 10/17/2017 1251 Last data filed at 10/17/2017 0756 Gross per 24 hour  Intake 480 ml  Output 550 ml  Net -70 ml   Filed Weights   10/15/17 1708 10/16/17 0433 10/17/17 0543  Weight: 49 kg (108 lb 0.4 oz) 48.5 kg (106 lb 14.8 oz) 46.8 kg (103 lb 2.8 oz)   REVIEW OF SYSTEMS  As per history otherwise all reviewed and reported negative  Exam:  General exam: Elderly female, lying in bed, awake, in no distress, cooperative.  Alert and oriented. Respiratory system: No crackles heard. No increased work of breathing. Cardiovascular system: S1 & S2 heard,. Gastrointestinal system: Abdomen is nondistended, soft and  nontender. Normal bowel sounds heard. Central nervous system: Alert and oriented. No focal neurological deficits. Extremities: no CCE.  Data Reviewed: Basic Metabolic Panel: Recent Labs  Lab 10/15/17 0926 10/16/17 0408 10/17/17 0936  NA 139 138 140  K 5.0 5.2* 4.0  CL 105 106 102  CO2 21* 24 24  GLUCOSE 126* 105* 132*  BUN 42* 45* 43*  CREATININE 1.69* 1.96* 2.00*  CALCIUM 10.1 9.6 9.2  MG  --  1.6* 1.5*  PHOS  --  3.6  --    Liver Function Tests: Recent Labs  Lab 10/15/17 0926  AST 30  ALT 27  ALKPHOS 117  BILITOT 1.0  PROT 6.9  ALBUMIN 4.1   No results for input(s): LIPASE, AMYLASE in the last 168 hours. No  results for input(s): AMMONIA in the last 168 hours. CBC: Recent Labs  Lab 10/15/17 0926 10/16/17 0408  WBC 6.8 5.3  NEUTROABS 5.2 2.7  HGB 11.9* 10.8*  HCT 38.4 34.8*  MCV 90.6 89.7  PLT 189 SPECIMEN CHECKED FOR CLOTS   Cardiac Enzymes: Recent Labs  Lab 10/15/17 0926 10/15/17 1602 10/15/17 2230 10/16/17 0408  TROPONINI 0.05* 0.05* 0.05* 0.05*   CBG (last 3)  No results for input(s): GLUCAP in the last 72 hours. No results found for this or any previous visit (from the past 240 hour(s)).   Studies: US Renal  Result Date: 10/16/2017 CLINICAL DATA:  Acute renal injury EXAM: RENAL / URINARY TRACT ULTRASOUND COMPLETE COMPARISON:  CT from 05/09/2017 FINDINGS: Right Kidney: Length: 10.8 cm. No hydronephrosis is noted. A 3.6 cm cyst is noted similar to that seen on prior CT. No calculi are seen. Left Kidney: Atrophic in nature measuring 8 cm. This is similar to that seen on prior CT examination. No hydronephrosis is noted. Bladder: Appears normal for degree of bladder distention. IMPRESSION: Atrophic left kidney. Stable right renal cyst. No acute abnormality noted. Electronically Signed   By: Alcide Clever M.D.   On: 10/16/2017 16:49   Dg Chest Port 1 View  Result Date: 10/16/2017 CLINICAL DATA:  Acute exacerbation of congestive heart failure. Cardiomyopathy. Diabetes. Hypertension. History of pulmonary embolus. Nonsmoker. EXAM: PORTABLE CHEST 1 VIEW COMPARISON:  10/15/2017 FINDINGS: Cardiac enlargement with pulmonary vascular congestion. Interstitial infiltration likely edema. No pleural effusions. No pneumothorax. No focal consolidation. Calcification of the aorta. Degenerative changes in the shoulders. IMPRESSION: Cardiac enlargement with pulmonary vascular congestion and mild interstitial edema. Mild progression since previous study. Electronically Signed   By: Burman Nieves M.D.   On: 10/16/2017 06:17   Scheduled Meds: . aspirin EC  81 mg Oral Daily  . enoxaparin (LOVENOX)  injection  30 mg Subcutaneous Q24H  . [START ON 10/18/2017] furosemide  20 mg Oral Daily  . metoprolol succinate  12.5 mg Oral Daily   Continuous Infusions: . magnesium sulfate 1 - 4 g bolus IVPB      Principal Problem:   Acute exacerbation of CHF (congestive heart failure) (HCC) Active Problems:   Hyperlipidemia   Dysthymic disorder   Essential hypertension   Left bundle branch block (LBBB)   OVERACTIVE BLADDER   Arthropathy   Difficulty walking   Type II diabetes mellitus (HCC)   Chest pain   Nonischemic cardiomyopathy (HCC)   Coronary artery disease  Time spent:   Standley Dakins, MD, FAAFP [facility] Pager [phone]  If 7PM-7AM, please contact night-coverage [email or web address] Password [facility] 10/17/2017, 12:51 PM    LOS: 2 days

## 2017-10-17 NOTE — Evaluation (Signed)
Physical Therapy Evaluation Patient Details Name: [name] [name] MRN: [id number] DOB: 24-Jan-1924 Today's Date: 10/17/2017   History of Present Illness  [name] [name] is a 84 y.o. 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  She says that she has been itching more for the past 2 weeks. She was hospitalized in [place] and told she had a mild heart attack.  None of the medications given helped with the itching. She went to a dermatologist and they were not able to help her itching symptoms.  She has had some intermittent chest pain and shortness of breath over the last couple of weeks. She denies swelling in legs and orthopnea.  She denies cough, fever and chills. She denies extremity weakness.  She denies rash on skin and reports that she has been concerned about her kidneys.  She is not taking any medications except for nitroglycerin.     Clinical Impression  Patient limited for functional mobility as stated below secondary to generalized weakness, fatigue and fair/poor standing balance.  Patient will benefit from continued physical therapy in hospital and recommended venue below to increase strength, balance, endurance for safe ADLs and gait.    Follow Up Recommendations Home health PT;Supervision - Intermittent    Equipment Recommendations  None recommended by PT    Recommendations for Other Services       Precautions / Restrictions Precautions Precautions: Fall Restrictions Weight Bearing Restrictions: No      Mobility  Bed Mobility Overal bed mobility: Needs Assistance Bed Mobility: Sit to Supine;Supine to Sit     Supine to sit: Supervision Sit to supine: Supervision      Transfers Overall transfer level: Needs assistance Equipment used: Rolling walker (2 wheeled) Transfers: Sit to/from  [employer] Sit to Stand: Supervision Stand pivot transfers: Min guard          Ambulation/Gait Ambulation/Gait assistance: Supervision Ambulation Distance (Feet): 100 Feet Assistive device: Rolling walker (2 wheeled) Gait Pattern/deviations: Decreased step length - right;Decreased step length - left;Decreased stride length   Gait velocity interpretation: Below normal speed for age/gender General Gait Details: slightly labored slow cadence without loss of balance  Stairs            Wheelchair Mobility    Modified Rankin (Stroke Patients Only)       Balance Overall balance assessment: Needs assistance Sitting-balance support: No upper extremity supported;Feet supported Sitting balance-Leahy Scale: Good     Standing balance support: Bilateral upper extremity supported;During functional activity Standing balance-Leahy Scale: Fair                               Pertinent Vitals/Pain Pain Assessment: No/denies pain    Home Living Family/patient expects to be discharged to:: Private residence Living Arrangements: Alone Available Help at Discharge: Personal care attendant;Family Type of Home: House Home Access: Level entry     Home Layout: One level Home Equipment: Shower seat;Bedside commode;Walker - 2 wheels      Prior Function Level of Independence: Needs assistance   Gait / Transfers Assistance Needed: Ambulates with RW for hosuehold distances  ADL's / Homemaking Assistance Needed: has caregivers 2 hours/day x 7 days/week        Hand Dominance        Extremity/Trunk Assessment  Upper Extremity Assessment Upper Extremity Assessment: Generalized weakness    Lower Extremity Assessment Lower Extremity Assessment: Generalized weakness    Cervical / Trunk Assessment Cervical / Trunk Assessment: Normal  Communication   Communication: No difficulties  Cognition Arousal/Alertness: Awake/alert Behavior During Therapy:  WFL for tasks assessed/performed Overall Cognitive Status: Within Functional Limits for tasks assessed                                        General Comments      Exercises     Assessment/Plan    PT Assessment Patient needs continued PT services  PT Problem List Decreased strength;Decreased activity tolerance;Decreased balance;Decreased mobility       PT Treatment Interventions Gait training;Functional mobility training;Therapeutic activities;Therapeutic exercise;Patient/family education    PT Goals (Current goals can be found in the Care Plan section)  Acute Rehab PT Goals Patient Stated Goal: return home PT Goal Formulation: With patient Time For Goal Achievement: 10/20/17 Potential to Achieve Goals: Good    Frequency Min 3X/week   Barriers to discharge        Co-evaluation               AM-PAC PT "6 Clicks" Daily Activity  Outcome Measure Difficulty turning over in bed (including adjusting bedclothes, sheets and blankets)?: None Difficulty moving from lying on back to sitting on the side of the bed? : None Difficulty sitting down on and standing up from a chair with arms (e.g., wheelchair, bedside commode, etc,.)?: A Little Help needed moving to and from a bed to chair (including a wheelchair)?: A Little Help needed walking in hospital room?: A Little Help needed climbing 3-5 steps with a railing? : A Little 6 Click Score: 20    End of Session   Activity Tolerance: Patient tolerated treatment well Patient left: in chair;with call bell/phone within reach Nurse Communication: Mobility status PT Visit Diagnosis: Unsteadiness on feet (R26.81);Other abnormalities of gait and mobility (R26.89);Muscle weakness (generalized) (M62.81)    Time: 4098-1191 PT Time Calculation (min) (ACUTE ONLY): 25 min   Charges:   PT Evaluation $PT Eval Low Complexity: 1 Low PT Treatments $Therapeutic Activity: 23-37 mins   PT G Codes:        10:34 AM,  11/04/2017 Ocie Bob, MPT Physical Therapist with [facility] [phone] office [phone] mobile phone

## 2017-10-17 NOTE — Progress Notes (Signed)
Pt ambulated 500 ft. Pt tolerated ambulation well and denied any chest pain. Pt ambulated with the use of assisted device.

## 2017-10-17 NOTE — Progress Notes (Signed)
Progress Note  Patient Name: [name] [name] [facility] Date of Encounter: 10/17/2017  Primary Cardiologist: Dr. Tresa Endo  Subjective   Breathing has improved. She denies any chest pain or palpitations. Still having diffuse, intermittent itching along her back and legs.   Inpatient Medications    Scheduled Meds: . aspirin EC  81 mg Oral Daily  . enoxaparin (LOVENOX) injection  30 mg Subcutaneous Q24H   Continuous Infusions:  PRN Meds: acetaminophen, guaiFENesin-dextromethorphan, hydrocortisone, hydrocortisone cream, hydrOXYzine, lip balm, MUSCLE RUB, nitroGLYCERIN, phenol, polyvinyl alcohol, sodium chloride   Vital Signs    Vitals:   10/16/17 0433 10/16/17 1438 10/16/17 2300 10/17/17 0543  BP: 114/65 117/64 (!) 100/53 109/73  Pulse: 76 74 76 92  Resp: 19 20 18 15   Temp: 98.2 F (36.8 C) 98.6 F (37 C) 98 F (36.7 C) 98.5 F (36.9 C)  TempSrc: Oral Oral Oral Oral  SpO2: 100% 99% 96% 100%  Weight: 106 lb 14.8 oz (48.5 kg)   103 lb 2.8 oz (46.8 kg)  Height:        Intake/Output Summary (Last 24 hours) at 10/17/2017 1102 Last data filed at 10/16/2017 1803 Gross per 24 hour  Intake 240 ml  Output 550 ml  Net -310 ml   Filed Weights   10/15/17 1708 10/16/17 0433 10/17/17 0543  Weight: 108 lb 0.4 oz (49 kg) 106 lb 14.8 oz (48.5 kg) 103 lb 2.8 oz (46.8 kg)    Telemetry    NSR, HR in 70's to 80's with occasional PVC's.  - Personally Reviewed  ECG    No new tracings.   Physical Exam   General: Well developed, elderly Caucasian female appearing in no acute distress. Head: Normocephalic, atraumatic.  Neck: Supple without bruits, JVD at 8cm Lungs:  Resp regular and unlabored, mild rales along bases bilarerally. Heart: RRR, S1, S2, no S3, S4, no rub. 2/6 SEM along Apex. Abdomen: Soft, non-tender, non-distended with normoactive bowel sounds. No hepatomegaly. No rebound/guarding. No obvious abdominal masses. Extremities: No clubbing, cyanosis, or edema. Distal pedal pulses are  2+ bilaterally. Neuro: Alert and oriented X 3. Moves all extremities spontaneously. Psych: Normal affect.  Labs    Chemistry Recent Labs  Lab 10/15/17 0926 10/16/17 0408  NA 139 138  K 5.0 5.2*  CL 105 106  CO2 21* 24  GLUCOSE 126* 105*  BUN 42* 45*  CREATININE 1.69* 1.96*  CALCIUM 10.1 9.6  PROT 6.9  --   ALBUMIN 4.1  --   AST 30  --   ALT 27  --   ALKPHOS 117  --   BILITOT 1.0  --   GFRNONAA 25* 21*  GFRAA 29* 25*  ANIONGAP 13 8     Hematology Recent Labs  Lab 10/15/17 0926 10/16/17 0408  WBC 6.8 5.3  RBC 4.24 3.88  HGB 11.9* 10.8*  HCT 38.4 34.8*  MCV 90.6 89.7  MCH 28.1 27.8  MCHC 31.0 31.0  RDW 13.6 13.3  PLT 189 SPECIMEN CHECKED FOR CLOTS    Cardiac Enzymes Recent Labs  Lab 10/15/17 0926 10/15/17 1602 10/15/17 2230 10/16/17 0408  TROPONINI 0.05* 0.05* 0.05* 0.05*   No results for input(s): TROPIPOC in the last 168 hours.   BNP Recent Labs  Lab 10/15/17 0926 10/16/17 0408 10/17/17 0936  BNP >4,500.0* 4,481.0* 2,505.0*     DDimer No results for input(s): DDIMER in the last 168 hours.   Radiology    US Renal  Result Date: 10/16/2017 CLINICAL DATA:  Acute renal injury  EXAM: RENAL / URINARY TRACT ULTRASOUND COMPLETE COMPARISON:  CT from 05/09/2017 FINDINGS: Right Kidney: Length: 10.8 cm. No hydronephrosis is noted. A 3.6 cm cyst is noted similar to that seen on prior CT. No calculi are seen. Left Kidney: Atrophic in nature measuring 8 cm. This is similar to that seen on prior CT examination. No hydronephrosis is noted. Bladder: Appears normal for degree of bladder distention. IMPRESSION: Atrophic left kidney. Stable right renal cyst. No acute abnormality noted. Electronically Signed   By: Alcide Clever M.D.   On: 10/16/2017 16:49   Dg Chest Port 1 View  Result Date: 10/16/2017 CLINICAL DATA:  Acute exacerbation of congestive heart failure. Cardiomyopathy. Diabetes. Hypertension. History of pulmonary embolus. Nonsmoker. EXAM: PORTABLE CHEST 1  VIEW COMPARISON:  10/15/2017 FINDINGS: Cardiac enlargement with pulmonary vascular congestion. Interstitial infiltration likely edema. No pleural effusions. No pneumothorax. No focal consolidation. Calcification of the aorta. Degenerative changes in the shoulders. IMPRESSION: Cardiac enlargement with pulmonary vascular congestion and mild interstitial edema. Mild progression since previous study. Electronically Signed   By: Burman Nieves M.D.   On: 10/16/2017 06:17    Cardiac Studies   Echocardiogram: 06/2017 Study Conclusions  - Left ventricle: The cavity size was normal. Systolic function was   normal. The estimated ejection fraction was in the range of 15%   to 20%. Wall motion was normal; there were no regional wall   motion abnormalities. Doppler parameters are consistent with   abnormal left ventricular relaxation (grade 1 diastolic   dysfunction). - Ventricular septum: Septal motion showed moderate dyssynergy.   These changes are consistent with a left bundle Janis Sol block. - Mitral valve: There was moderate regurgitation. - Left atrium: The atrium was mildly dilated. - Pulmonary arteries: Systolic pressure was mildly increased. PA   peak pressure: 35 mm Hg (S). - Pericardium, extracardiac: A small pericardial effusion was   identified circumferential to the heart.  Patient Profile     84 y.o. female w/ PMH of CAD (diffuse nonobstructive CAD by cath in 2004), nonischemic cardiomyopathy (EF 30% in 2004, normalized in the interim but down to 15-20% by echo in 06/2017), known LBBB, HTN, HLD, CVA (occurring in 2017) and Stage 3-4 CKD who presented to [facility] ED on 10/15/17 for evaluation of itching and worsening dyspnea, found to have an acute CHF exacerbation.   Assessment & Plan    1. Acute on Chronic Combined Systolic and Diastolic CHF - the patient has a history of nonischemic cardiomyopathy with EF at 30% in 2004, normalized in the interim but down to 15-20% by echo in  06/2017. - she presented for evaluation of itching but also reported worsening dyspnea and was found to have a BNP > 4500 and CXR showing pulmonary vascular congestion and mild interstitial edema. She was started on IV Lasix 40mg  BID at the time of admission with minimal recorded output, however weight has declined 5 lbs (108 --> 103 lbs). BNP also improved from > 4500 to 2505. BMET is pending. Can likely switch to PO Lasix within the next 24-48 hours.  - Will start Toprol-XL as outlined in prior notes. No ACE-I/ARB/ARNI at this time due to her AKI.   2. CAD/ Elevated Troponin - the patient has known diffuse nonobstructive CAD by cath in 2004 with 60% LAD stenosis noted at that time which has likely progressed over the past 15 years. EKG shows known LBBB.  - cyclic enzymes have been flat at 0.05, likely secondary to demand ischemia in the setting  of her CHF exacerbation and AKI.  - she denies any recent chest pain. Has baseline dyspnea on exertion. Medical therapy is favored given her advanced age and worsening kidney injury.   3. Acute on Chronic Stage 3-4 CKD - creatinine at 1.69 on admission, previously 1.39 in 06/2017. - elevated to 1.96 on 1/21. Repeat BMET pending. Nephrology following.   4. Mitral Regurgitation - moderate by recent echo. Continue to follow as an outpatient.   5. Anemia of Chronic Disease - she denies any evidence of active bleeding. Hgb stable at 10.8 on admission.  Repeat CBC pending.   6. Pruritus - has been occurring for the past 2-3 weeks. Improving per the patient's report.  - per admitting team.    For questions or updates, please contact [facility] Please consult [email or web address] for contact info under Cardiology/STEMI.   Lorri Frederick , PA-C 11:02 AM 10/17/2017 Pager: [phone]  Attending note Patient seen and discussed with PA Iran Ouch, I agree with her documentation above. Sever LV systolic dysfunction, unclear when the most  recent drop occurred. She has been resistant to multiple meds at home, and recently on her own stopped taking everything. I/Os are incomplete this admit, but weights have been trending down. Uptrend in Cr, we will hold diuretics today and likely start oral tomrrow 20mg  daily. Continue Toprol XL 12.5mg  daily, no ACE/ARB/ARNI/aldactone due to renal function, patient is also adamant about keeping her medication regimen simple, which at 89 is certainly reasonable. I would not consider a cath at this time due to poor renal function and advanced age, would manage her systolic dysfunction medically at this time.   Episodes of clear atach, as well as a wide complex rhythm I believe is atach with aberrancy as opposed to NSVT. Mg is 1.5 and will replace with 2grams IV, K is 4, normal TSH. She denies any palpitations. Starting beta blocker today.   Patient would be ok for discharge today. We will arrange f/u in [redacted] week along with BMET/Mg.    Dina Rich MD

## 2017-10-17 NOTE — Plan of Care (Signed)
  Acute Rehab PT Goals(only PT should resolve) Patient Will Transfer Sit To/From Stand 10/17/2017 1037 - Progressing by Ocie Bob, PT Flowsheets Taken 10/17/2017 1037  Patient will transfer sit to/from stand with modified independence Pt Will Transfer Bed To Chair/Chair To Bed 10/17/2017 1037 - Progressing by Ocie Bob, PT Flowsheets Taken 10/17/2017 1037  Pt will Transfer Bed to Chair/Chair to Bed with modified independence Pt Will Ambulate 10/17/2017 1037 - Progressing by Ocie Bob, PT Flowsheets Taken 10/17/2017 1037  Pt will Ambulate with modified independence;with rolling walker;> 125 feet  10:37 AM, 10/17/17 Ocie Bob, MPT Physical Therapist with [facility] [phone] office [phone] mobile phone

## 2017-10-18 DIAGNOSIS — I10 Essential (primary) hypertension: Secondary | ICD-10-CM

## 2017-10-18 DIAGNOSIS — I5043 Acute on chronic combined systolic (congestive) and diastolic (congestive) heart failure: Secondary | ICD-10-CM

## 2017-10-18 DIAGNOSIS — I447 Left bundle-branch block, unspecified: Secondary | ICD-10-CM

## 2017-10-18 DIAGNOSIS — E119 Type 2 diabetes mellitus without complications: Secondary | ICD-10-CM

## 2017-10-18 DIAGNOSIS — I2510 Atherosclerotic heart disease of native coronary artery without angina pectoris: Secondary | ICD-10-CM

## 2017-10-18 DIAGNOSIS — E785 Hyperlipidemia, unspecified: Secondary | ICD-10-CM

## 2017-10-18 LAB — CBC
HCT: 37.3 % (ref 36.0–46.0)
HEMOGLOBIN: 11.6 g/dL — AB (ref 12.0–15.0)
MCH: 27.9 pg (ref 26.0–34.0)
MCHC: 31.1 g/dL (ref 30.0–36.0)
MCV: 89.7 fL (ref 78.0–100.0)
Platelets: 192 10*3/uL (ref 150–400)
RBC: 4.16 MIL/uL (ref 3.87–5.11)
RDW: 13.3 % (ref 11.5–15.5)
WBC: 7.2 10*3/uL (ref 4.0–10.5)

## 2017-10-18 LAB — BASIC METABOLIC PANEL
Anion gap: 13 (ref 5–15)
BUN: 47 mg/dL — AB (ref 6–20)
CHLORIDE: 103 mmol/L (ref 101–111)
CO2: 24 mmol/L (ref 22–32)
CREATININE: 1.88 mg/dL — AB (ref 0.44–1.00)
Calcium: 9.3 mg/dL (ref 8.9–10.3)
GFR calc Af Amer: 26 mL/min — ABNORMAL LOW (ref >60)
GFR calc non Af Amer: 22 mL/min — ABNORMAL LOW (ref >60)
Glucose, Bld: 116 mg/dL — ABNORMAL HIGH (ref 65–99)
POTASSIUM: 3.9 mmol/L (ref 3.5–5.1)
SODIUM: 140 mmol/L (ref 135–145)

## 2017-10-18 LAB — RENAL FUNCTION PANEL
ALBUMIN: 3.4 g/dL — AB (ref 3.5–5.0)
Anion gap: 14 (ref 5–15)
BUN: 46 mg/dL — ABNORMAL HIGH (ref 6–20)
CALCIUM: 9.3 mg/dL (ref 8.9–10.3)
CO2: 24 mmol/L (ref 22–32)
Chloride: 103 mmol/L (ref 101–111)
Creatinine, Ser: 1.78 mg/dL — ABNORMAL HIGH (ref 0.44–1.00)
GFR calc non Af Amer: 24 mL/min — ABNORMAL LOW (ref >60)
GFR, EST AFRICAN AMERICAN: 28 mL/min — AB (ref >60)
GLUCOSE: 116 mg/dL — AB (ref 65–99)
PHOSPHORUS: 3.4 mg/dL (ref 2.5–4.6)
POTASSIUM: 3.9 mmol/L (ref 3.5–5.1)
SODIUM: 141 mmol/L (ref 135–145)

## 2017-10-18 LAB — MAGNESIUM: MAGNESIUM: 2 mg/dL (ref 1.7–2.4)

## 2017-10-18 LAB — BRAIN NATRIURETIC PEPTIDE: B Natriuretic Peptide: 2811 pg/mL — ABNORMAL HIGH (ref 0.0–100.0)

## 2017-10-18 MED ORDER — FUROSEMIDE 20 MG PO TABS: 20.0000 mg | ORAL_TABLET | Freq: Every day | ORAL | 1 refills | Status: DC

## 2017-10-18 MED ORDER — HYDROCORTISONE 2.5 % RE CREA: 1.0000 "application " | TOPICAL_CREAM | Freq: Two times a day (BID) | RECTAL | 0 refills | Status: AC | PRN

## 2017-10-18 MED ORDER — HYDROCORTISONE 1 % EX CREA: 1.0000 "application " | TOPICAL_CREAM | Freq: Three times a day (TID) | CUTANEOUS | 0 refills | Status: DC | PRN

## 2017-10-18 MED ORDER — METOPROLOL SUCCINATE ER 25 MG PO TB24: 12.5000 mg | ORAL_TABLET | Freq: Every day | ORAL | 0 refills | Status: DC

## 2017-10-18 MED ORDER — ASPIRIN 81 MG PO TBEC: 81.0000 mg | DELAYED_RELEASE_TABLET | Freq: Every day | ORAL | 0 refills | Status: AC

## 2017-10-18 NOTE — Progress Notes (Signed)
[name] [name]  MRN: [id number]  DOB/AGE: Feb 02, 1924 84 y.o.  Primary Care Physician:Shah, Beatrix Fetters, MD  Admit date: 10/15/2017  Chief Complaint:  Chief Complaint  Patient presents with  . Nausea    S-Pt presented on  10/15/2017 with  Chief Complaint  Patient presents with  . Nausea  .    Pt main concern was " I do not like the food here"  Meds . aspirin EC  81 mg Oral Daily  . enoxaparin (LOVENOX) injection  30 mg Subcutaneous Q24H  . furosemide  20 mg Oral Daily  . metoprolol succinate  12.5 mg Oral Daily      Physical Exam: Vital signs in last 24 hours: Temp:  [97.6 F (36.4 C)-98.1 F (36.7 C)] 97.9 F (36.6 C) (01/23 0351) Pulse Rate:  [81-92] 82 (01/23 0351) Resp:  [18-20] 20 (01/23 0351) BP: (98-113)/(53-62) 109/56 (01/23 0351) SpO2:  [98 %-99 %] 98 % (01/23 0351) Weight:  [102 lb 8.2 oz (46.5 kg)] 102 lb 8.2 oz (46.5 kg) (01/23 0351) Weight change: -10.6 oz (-0.3 kg) Last BM Date: 10/16/17  Intake/Output from previous day: 01/22 0701 - 01/23 0700 In: 480 [P.O.:480] Out: 700 [Urine:700] No intake/output data recorded.   Physical Exam: General- pt is awake,alert, oriented to time place and person Resp- No acute REsp distress, CTA B/L NO Rhonchi CVS- S1S2 regular in rate and rhythm GIT- BS+, soft, NT, ND EXT- NO LE Edema, Cyanosis   Lab Results: CBC Recent Labs    10/16/17 0408 10/18/17 0438  WBC 5.3 7.2  HGB 10.8* 11.6*  HCT 34.8* 37.3  PLT SPECIMEN CHECKED FOR CLOTS 192    BMET Recent Labs    10/17/17 0936 10/18/17 0438  NA 140 141  140  K 4.0 3.9  3.9  CL 102 103  103  CO2 24 24  24   GLUCOSE 132* 116*  116*  BUN 43* 46*  47*  CREATININE 2.00* 1.78*  1.88*  CALCIUM 9.2 9.3  9.3    Creat trend 2019   1.69--2.0 2018   1.1--1.5 2017    1.46 2009     1.29     Lab Results  Component Value Date   CALCIUM 9.3 10/18/2017   CALCIUM 9.3 10/18/2017   CAION 1.22 02/08/2012   PHOS 3.4 10/18/2017                Impression: 1)Renal  AKI secondary to Cardiorenal                AKI on CKD               CKD stage 4.               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                 Hematuria none.                Nephrolithiasis Hx Absent                AKI stable              Creat  at plateau    2)HTN  Medication- On Diuretics  3)Anemia HGb at goal (9--11)   4)CKD Mineral-Bone Disorder  Phosphorus at goal. Calcium is at goal.  5)CHF-admited with CHF PMD following  6)Electrolytes  hyperkalemic   On diuretics  NOrmonatremic  7)Acid base Co2 at goal  8) Itching- unlikely to be secondary to CKD as pt phosphorus is within normal limits     Plan:  Will continue current care      Lamario Mani S 10/18/2017, 9:16 AM

## 2017-10-18 NOTE — Care Management Important Message (Signed)
Important Message  Patient Details  Name: [name] [name] MRN: [id number] Date of Birth: 14-Jan-1924   Medicare Important Message Given:  Yes    Malcolm Metro, RN 10/18/2017, 2:26 PM

## 2017-10-18 NOTE — Discharge Summary (Addendum)
Physician Discharge Summary  [name] [name] [facility] FAO:[id number] DOB: [birth year] DOA: 10/15/2017  PCP: Kirstie Peri, MD  Admit date: 10/15/2017 Discharge date: 10/18/2017  Admitted From: Home Disposition: Home  Recommendations for Outpatient Follow-up:  1. Follow up with PCP in 1-2 weeks 2. Please obtain BMP/CBC in one week 3. 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 She says that she has been itching more for the past 2 weeks. The patient was noted to have elevated BUN/Cr, BNP>4500. She had some clinical findings of congestive heart failure although her chest xray did not show pulmonary edema. Her troponin was mildly elevated at 0.05. She was given 40 mg IVP lasix and admission was requested.   Discharge Diagnoses:  Principal Problem:   Acute on chronic combined systolic and diastolic CHF (congestive heart failure) (HCC) Active Problems:   Hyperlipidemia   Dysthymic disorder   Essential hypertension   Left bundle branch block (LBBB)   OVERACTIVE BLADDER   Arthropathy   Difficulty walking   Type II diabetes mellitus (HCC)   Chest pain   Nonischemic cardiomyopathy (HCC)   Coronary artery disease  1. Mild acute systolic CHF exacerbation -patient was admitted to the hospital with decompensated CHF. She was ordered for lasix 40 mg IVP and had diuresed well.  Seen by cardiology who recommended starting oral diuretics 1/23. Weight trending down. I/Os not accurate, following weight. 2D echocardiogram  shows EF of 15-20%, consistent with prior values.   Management will be medical at this point.  She will need follow-up with cardiology in the next 1 week..   2. CAD - Stable, she is not a candidate for cath given her renal failure and advanced age. 3. Elevated troponin.  Mild elevation of troponin 0.05 related to demand ischemia 4. LBBB - this is a chronic finding noted on many older EKGs.  5. Type 2 DM - this is diet controlled and she no longer takes medications for this.  Continue to monitor blood sugars on discharge 6. Essential hypertension -stable on beta-blockers. 7. CKD stage 4 - Worsening CKD with diuresis.  Appreciate nephrology assistance. Suspect cardiorenal syndrome contributing to the decline. Creatinine appears to be slowly improving now.  Continue on low-dose Lasix.  Follow-up with repeat chemistry in 1 week.  Will likely need to tolerate a higher creatinine at baseline.    8. Gait instability -seen by physical therapy who recommended home health physical therapy 9. Code Status: Dr. Laural Benes spoke with patient and spoke with her POA [name] [name] and she wishes to remain FULL CODE at this time  Discharge Instructions  Discharge Instructions    Diet - low sodium heart healthy   Complete by:  As directed    Increase activity slowly   Complete by:  As directed      Allergies as of 10/18/2017   No Known Allergies     Medication List    TAKE these medications   aspirin 81 MG EC tablet Take 1 tablet (81 mg total) by mouth daily. Start taking on:  10/19/2017 What changed:    medication strength  how much to take  furosemide 20 MG tablet Commonly known as:  LASIX Take 1 tablet (20 mg total) by mouth daily. Start taking on:  10/19/2017   hydrocortisone 2.5 % rectal cream Commonly known as:  ANUSOL-HC Apply 1 application topically 2 (two) times daily as needed for hemorrhoids.   hydrocortisone cream 1 % Apply 1 application topically 3 (three) times daily as needed for itching (minor skin irritation).   metoprolol  succinate 25 MG 24 hr tablet Commonly known as:  TOPROL-XL Take 0.5 tablets (12.5 mg total) by mouth daily. Start taking on:  10/19/2017   nitroGLYCERIN 0.4 MG SL tablet Commonly known as:  NITROSTAT Place 0.4 mg under the tongue every 5 (five) minutes as needed.      Follow-up Information    Ellsworth Lennox, PA-C On 10/27/2017.   Specialties:  Physician Assistant, Cardiology Why:  at 2:30 pm Contact information: [address] [place] [address] [phone]          No Known Allergies  Consultations:  Cardiology  Nephrology   Procedures/Studies: Dg Chest 2 View  Result Date: 10/15/2017 CLINICAL DATA:  Shortness of Breath EXAM: CHEST  2 VIEW COMPARISON:  September 26, 2017 FINDINGS: There is no appreciable edema or consolidation. There is atelectatic change in left base. There is cardiomegaly with pulmonary vascularity within normal limits. There is aortic atherosclerosis. Bones are osteoporotic. There is degenerative change in the thoracic spine and in each shoulder. IMPRESSION: Cardiomegaly. Left base atelectasis. No edema or consolidation. There is aortic atherosclerosis. Aortic Atherosclerosis (ICD10-I70.0). Electronically Signed   By: Bretta Bang III M.D.   On: 10/15/2017 10:18   US Renal  Result Date: 10/16/2017 CLINICAL DATA:  Acute renal injury EXAM: RENAL / URINARY TRACT ULTRASOUND COMPLETE COMPARISON:  CT from 05/09/2017 FINDINGS: Right Kidney: Length: 10.8 cm. No hydronephrosis is noted. A 3.6 cm cyst is noted similar to that seen on prior CT. No calculi are seen. Left Kidney: Atrophic in nature measuring 8 cm. This is similar to that seen on prior CT examination. No hydronephrosis is noted. Bladder: Appears normal for degree of bladder distention. IMPRESSION: Atrophic left kidney. Stable right renal cyst. No acute abnormality noted. Electronically Signed   By: Alcide Clever M.D.   On: 10/16/2017 16:49   Dg Chest Port 1 View  Result Date:  10/16/2017 CLINICAL DATA:  Acute exacerbation of congestive heart failure. Cardiomyopathy. Diabetes. Hypertension. History of pulmonary embolus. Nonsmoker. EXAM: PORTABLE CHEST 1 VIEW COMPARISON:  10/15/2017 FINDINGS: Cardiac enlargement with pulmonary vascular congestion. Interstitial infiltration likely edema. No pleural effusions. No pneumothorax. No focal consolidation. Calcification of the aorta. Degenerative changes in the shoulders. IMPRESSION: Cardiac enlargement with pulmonary vascular congestion and mild interstitial edema. Mild progression since previous study. Electronically Signed   By: Burman Nieves M.D.   On: 10/16/2017 06:17   Echo:- Left ventricle: The cavity size was normal. Wall thickness was   normal. The estimated ejection fraction was in the range of 15%   to 20%. Features are consistent with a pseudonormal left   ventricular filling pattern, with concomitant abnormal relaxation   and increased filling pressure (grade 2 diastolic dysfunction).   Doppler parameters are consistent with high ventricular filling   pressure. - Aortic valve: Mildly to moderately calcified annulus. Trileaflet;   mildly thickened leaflets. Valve area (VTI): 1.79 cm^2. Valve   area (Vmax): 1.59 cm^2. - Mitral valve: There was mild to moderate regurgitation. - Left atrium: The atrium was moderately dilated. - Right ventricle: The cavity size  was mildly dilated. Systolic   function was mildly reduced. - Right atrium: The atrium was mildly dilated. - Atrial septum: No defect or patent foramen ovale was identified. - Tricuspid valve: There was moderate regurgitation   Subjective: Feeling better today.  Shortness of breath is better.  No chest pain.  Wants to go home.  Discharge Exam: Vitals:   10/18/17 0822 10/18/17 1021  BP:  107/72  Pulse:  83  Resp:    Temp:    SpO2: 97%    Vitals:   10/17/17 2248 10/18/17 0351 10/18/17 0822 10/18/17 1021  BP: 113/61 (!) 109/56  107/72  Pulse: 81 82   83  Resp: 20 20    Temp: 97.6 F (36.4 C) 97.9 F (36.6 C)    TempSrc: Oral Oral    SpO2: 98% 98% 97%   Weight:  46.5 kg (102 lb 8.2 oz)    Height:        General: Pt is alert, awake, not in acute distress Cardiovascular: RRR, S1/S2 +, no rubs, no gallops Respiratory: CTA bilaterally, no wheezing, no rhonchi Abdominal: Soft, NT, ND, bowel sounds + Extremities: no edema, no cyanosis    The results of significant diagnostics from this hospitalization (including imaging, microbiology, ancillary and laboratory) are listed below for reference.     Microbiology: No results found for this or any previous visit (from the past 240 hour(s)).   Labs: BNP (last 3 results) Recent Labs    10/16/17 0408 10/17/17 0936 10/18/17 0438  BNP 4,481.0* 2,505.0* 2,811.0*   Basic Metabolic Panel: Recent Labs  Lab 10/15/17 0926 10/16/17 0408 10/17/17 0936 10/18/17 0438  NA 139 138 140 141  140  K 5.0 5.2* 4.0 3.9  3.9  CL 105 106 102 103  103  CO2 21* 24 24 24  24   GLUCOSE 126* 105* 132* 116*  116*  BUN 42* 45* 43* 46*  47*  CREATININE 1.69* 1.96* 2.00* 1.78*  1.88*  CALCIUM 10.1 9.6 9.2 9.3  9.3  MG  --  1.6* 1.5* 2.0  PHOS  --  3.6  --  3.4   Liver Function Tests: Recent Labs  Lab 10/15/17 0926 10/18/17 0438  AST 30  --   ALT 27  --   ALKPHOS 117  --   BILITOT 1.0  --   PROT 6.9  --   ALBUMIN 4.1 3.4*   No results for input(s): LIPASE, AMYLASE in the last 168 hours. No results for input(s): AMMONIA in the last 168 hours. CBC: Recent Labs  Lab 10/15/17 0926 10/16/17 0408 10/18/17 0438  WBC 6.8 5.3 7.2  NEUTROABS 5.2 2.7  --   HGB 11.9* 10.8* 11.6*  HCT 38.4 34.8* 37.3  MCV 90.6 89.7 89.7  PLT 189 SPECIMEN CHECKED FOR CLOTS 192   Cardiac Enzymes: Recent Labs  Lab 10/15/17 0926 10/15/17 1602 10/15/17 2230 10/16/17 0408  TROPONINI 0.05* 0.05* 0.05* 0.05*   BNP: Invalid input(s): POCBNP CBG: No results for input(s): GLUCAP in the last 168  hours. D-Dimer No results for input(s): DDIMER in the last 72 hours. Hgb A1c No results for input(s): HGBA1C in the last 72 hours. Lipid Profile No results for input(s): CHOL, HDL, LDLCALC, TRIG, CHOLHDL, LDLDIRECT in the last 72 hours. Thyroid function studies Recent Labs    10/15/17 1602  TSH 1.589   Anemia work up No results for input(s): VITAMINB12, FOLATE, FERRITIN, TIBC, IRON, RETICCTPCT in the last 72 hours. Urinalysis    Component Value Date/Time  COLORURINE YELLOW 10/15/2017 0940   APPEARANCEUR CLEAR 10/15/2017 0940   LABSPEC 1.016 10/15/2017 0940   PHURINE 5.0 10/15/2017 0940   GLUCOSEU NEGATIVE 10/15/2017 0940   HGBUR SMALL (A) 10/15/2017 0940   BILIRUBINUR NEGATIVE 10/15/2017 0940   KETONESUR NEGATIVE 10/15/2017 0940   PROTEINUR NEGATIVE 10/15/2017 0940   UROBILINOGEN 0.2 12/30/2007 1850   NITRITE NEGATIVE 10/15/2017 0940   LEUKOCYTESUR NEGATIVE 10/15/2017 0940   Sepsis Labs Invalid input(s): PROCALCITONIN,  WBC,  LACTICIDVEN Microbiology No results found for this or any previous visit (from the past 240 hour(s)).   Time coordinating discharge: Over 30 minutes  SIGNED:   Erick Blinks, MD  [facility] 10/18/2017, 1:45 PM Pager   If 7PM-7AM, please contact night-coverage [email or web address] Password [email or web address]

## 2017-10-18 NOTE — Care Management Note (Signed)
Case Management Note  Patient Details  Name: [name] [name] MRN: [id number] Date of Birth: 1923-12-16  Expected Discharge Date:  10/18/17               Expected Discharge Plan:  Home w Home Health Services  In-House Referral:  NA  Discharge planning Services  CM Consult  Post Acute Care Choice:  Home Health, Resumption of Svcs/PTA Provider Choice offered to:  Patient  HH Arranged:  RN, OT, PT HH Agency:  [facility]  Status of Service:  Completed, signed off  If discussed at Long Length of Stay Meetings, dates discussed:    Additional Comments: Pt discharging home today with resumption of HH services. PT has recommended HH PT. PT is agreeable and chose [facility] for HHA. When referral was called to [facility], she notified CM that they are active with RN/OT. Pt resuming RN, OT services and adding PT. Pt aware HH has 48 hrs to make next visit. [facility] will pull pt info from chart. CM has not been able to contact DSS SW d/t holiday. CM will attempt only to make sure they are aware pt is not getting cared for properly and would like a different aid agency.   Malcolm Metro, RN 10/18/2017, 2:26 PM

## 2017-10-18 NOTE — Progress Notes (Signed)
IV discontinued,catheter intact. Discharge instructions given on medications,and follow up visits,patient verbalized understanding. Prescriptions sent to Pharmacy of choice documented on AVS. Accompanied by staff to an awaiting vehicle. 

## 2017-10-19 LAB — IMMUNOFIXATION ELECTROPHORESIS
IgA: 174 mg/dL (ref 64–422)
IgG (Immunoglobin G), Serum: 786 mg/dL (ref 700–1600)
IgM (Immunoglobulin M), Srm: 90 mg/dL (ref 26–217)
Total Protein ELP: 5.9 g/dL — ABNORMAL LOW (ref 6.0–8.5)

## 2017-10-20 LAB — UIFE/LIGHT CHAINS/TP QN, 24-HR UR
% BETA, Urine: 0 %
ALPHA 1 URINE: 0 %
Albumin, U: 100 %
Alpha 2, Urine: 0 %
Free Kappa Lt Chains,Ur: 3.79 mg/L (ref 1.35–24.19)
Free Kappa/Lambda Ratio: 9.24 (ref 2.04–10.37)
Free Lambda Lt Chains,Ur: 0.41 mg/L (ref 0.24–6.66)
GAMMA GLOBULIN URINE: 0 %
Total Protein, Urine-Ur/day: 47 mg/24 hr (ref 30–150)
Total Protein, Urine: 4.1 mg/dL
Total Volume: 1150

## 2017-10-26 NOTE — Progress Notes (Signed)
Cardiology Office Note    Date:  10/27/2017   ID:  ERIKKA FOLLMER, DOB 1924/04/14, MRN [id number]  PCP:  Kirstie Peri, MD  Cardiologist: Dr. Tresa Endo --> wishes to follow with Dr. Wyline Mood in [place], [place]  Chief Complaint  Patient presents with  . Hospitalization Follow-up    History of Present Illness:    [name] [name] is a 84 y.o. female with past medical history of CAD (diffuse nonobstructive CAD by cath in 2004), chronic combined systolic and diastolic CHF(EF 78% in 2004, normalized in the interim but down to 15-20% by echo in 06/2017), known LBBB, moderate MR, HTN, HLD, CVA (occurring in 2017) and Stage 3-4 CKD who presents to the office today for hospital follow-up.   She was recently admitted to [facility] from 1/20 - 10/18/2017 for evaluation of "itching" but also reported having worsening dyspnea on exertion for the past several days. She had been admitted to [facility] the week prior and was started on "14 medications" at the time of discharge but did not take these due to feeling like there were too many to keep up with. BNP was elevated to greater than 4500 at the time of admission and CXR showed vascular congestion and mild interstitial edema. She diuresed well with IV Lasix and was transitioned to PO Lasix 20 mg daily at the time of discharge along with being started on Toprol XL 12.5 mg daily. Weight was at 102 lbs at the time of discharge. She was not started on an ACE-I/ARB/or ARNI secondary to renal dysfunction.   In talking with the patient today, she reports overall doing well since her hospitalization. She reports breathing has been at baseline and she denies any recent chest pain, orthopnea, PND, or palpitations. She continues to live by herself with family nearby and is able to perform ADLs independently.  She is unsure of the exact medications that she is taking at home and did not bring her medications with her today. She reports weight has been stable on her home  scales.   She continues to have itching as previously addressed during her hospital stay. Was evaluated by her PCP for this two days ago but says the cream he prescribed at that time did not help.   Past Medical History:  Diagnosis Date  . Arthritis   . Coronary artery disease    Cath 2004--60% LAD--diffuse and calcified, 40% PDA  . Depression   . Hyperlipidemia   . Hypertension   . Nonischemic cardiomyopathy (HCC)    a. EF 30% 2004 b. 10/2006 EF=60-65% c. 06/2017: EF 15-20%, no regional WMA, Grade 1 DD, moderate MR  . Overactive bladder   . Pulmonary embolism (HCC)    status post  . Type II diabetes mellitus (HCC)     Past Surgical History:  Procedure Laterality Date  . APPENDECTOMY  1959  . BACK SURGERY  1967; 1977  . CATARACT EXTRACTION W/ INTRAOCULAR LENS  IMPLANT, BILATERAL  2008   right eye  . CHOLECYSTECTOMY  1959  . TONSILLECTOMY      Current Medications: Outpatient Medications Prior to Visit  Medication Sig Dispense Refill  . aspirin EC 81 MG EC tablet Take 1 tablet (81 mg total) by mouth daily. 30 tablet 0  . hydrocortisone (ANUSOL-HC) 2.5 % rectal cream Apply 1 application topically 2 (two) times daily as needed for hemorrhoids. 30 g 0  . hydrocortisone cream 1 % Apply 1 application topically 3 (three) times daily as needed for  itching (minor skin irritation). 30 g 0  . nitroGLYCERIN (NITROSTAT) 0.4 MG SL tablet Place 0.4 mg under the tongue every 5 (five) minutes as needed.    . furosemide (LASIX) 20 MG tablet Take 1 tablet (20 mg total) by mouth daily. 30 tablet 1  . metoprolol succinate (TOPROL-XL) 25 MG 24 hr tablet Take 0.5 tablets (12.5 mg total) by mouth daily. 30 tablet 0   No facility-administered medications prior to visit.      Allergies:   Patient has no known allergies.   Social History   Socioeconomic History  . Marital status: Widowed    Spouse name: None  . Number of children: 2  . Years of education: None  . Highest education level: None   Social Needs  . Financial resource strain: None  . Food insecurity - worry: None  . Food insecurity - inability: None  . Transportation needs - medical: None  . Transportation needs - non-medical: None  Occupational History  . Occupation: retired  Tobacco Use  . Smoking status: Never Smoker  . Smokeless tobacco: Never Used  Substance and Sexual Activity  . Alcohol use: No  . Drug use: No  . Sexual activity: No  Other Topics Concern  . None  Social History Narrative  . None     Family History:  The patient's family history includes Stroke in her mother.   Review of Systems:   Please see the history of present illness.     General:  No chills, fever, night sweats or weight changes.  Cardiovascular:  No chest pain, dyspnea on exertion, orthopnea, palpitations, paroxysmal nocturnal dyspnea. Positive for lower extremity edema.  Dermatological: No rash, lesions/masses Respiratory: No cough, dyspnea Urologic: No hematuria, dysuria Abdominal:   No nausea, vomiting, diarrhea, bright red blood per rectum, melena, or hematemesis Neurologic:  No visual changes, wkns, changes in mental status. All other systems reviewed and are otherwise negative except as noted above.   Physical Exam:    VS:  BP 130/62   Pulse 79   Ht 5' 3.5" (1.613 m)   Wt 103 lb 3.2 oz (46.8 kg)   SpO2 99%   BMI 17.99 kg/m    General: Well developed, elderly Caucasian female appearing in no acute distress. Head: Normocephalic, atraumatic, sclera non-icteric, no xanthomas, nares are without discharge.  Neck: No carotid bruits. JVD not elevated.  Lungs: Respirations regular and unlabored, without wheezes or rales.  Heart: Regular rate and rhythm. No S3 or S4.  No rubs or gallops appreciated. 2/6 SEM along Apex. Abdomen: Soft, non-tender, non-distended with normoactive bowel sounds. No hepatomegaly. No rebound/guarding. No obvious abdominal masses. Msk:  Strength and tone appear normal for age. No joint  deformities or effusions. Extremities: No clubbing or cyanosis. 1+ pitting edema up to mid-shins bilaterally.  Distal pedal pulses are 2+ bilaterally. Neuro: Alert and oriented X 3. Moves all extremities spontaneously. No focal deficits noted. Psych:  Responds to questions appropriately with a normal affect. Skin: No rashes or lesions noted  Wt Readings from Last 3 Encounters:  10/27/17 103 lb 3.2 oz (46.8 kg)  10/18/17 102 lb 8.2 oz (46.5 kg)  07/21/17 109 lb 9.1 oz (49.7 kg)     Studies/Labs Reviewed:   EKG:  EKG is not ordered today.    Recent Labs: 10/15/2017: ALT 27; TSH 1.589 10/18/2017: B Natriuretic Peptide 2,811.0; Hemoglobin 11.6; Magnesium 2.0; Platelets 192 10/27/2017: BUN 44; Creatinine, Ser 1.65; Potassium 4.3; Sodium 137   Lipid Panel  Component Value Date/Time   CHOL 141 07/21/2017 0457   TRIG 112 07/21/2017 0457   HDL 43 07/21/2017 0457   CHOLHDL 3.3 07/21/2017 0457   VLDL 22 07/21/2017 0457   LDLCALC 76 07/21/2017 0457    Additional studies/ records that were reviewed today include:   Echocardiogram: 10/17/2017 Study Conclusions  - Left ventricle: The cavity size was normal. Wall thickness was   normal. The estimated ejection fraction was in the range of 15%   to 20%. Features are consistent with a pseudonormal left   ventricular filling pattern, with concomitant abnormal relaxation   and increased filling pressure (grade 2 diastolic dysfunction).   Doppler parameters are consistent with high ventricular filling   pressure. - Aortic valve: Mildly to moderately calcified annulus. Trileaflet;   mildly thickened leaflets. Valve area (VTI): 1.79 cm^2. Valve   area (Vmax): 1.59 cm^2. - Mitral valve: There was mild to moderate regurgitation. - Left atrium: The atrium was moderately dilated. - Right ventricle: The cavity size was mildly dilated. Systolic   function was mildly reduced. - Right atrium: The atrium was mildly dilated. - Atrial septum: No  defect or patent foramen ovale was identified. - Tricuspid valve: There was moderate regurgitation.  Assessment:    1. Chronic combined systolic (congestive) and diastolic (congestive) heart failure (HCC)   2. Atherosclerosis of native coronary artery of native heart without angina pectoris   3. Medication management   4. Essential hypertension   5. Non-rheumatic mitral regurgitation   6. Stage 3 chronic kidney disease (HCC)      Plan:   In order of problems listed above:  1. Chronic Combined Systolic and Diastolic CHF - the patient has a known reduced EF of 30% in 2004, normalized in the interim but down to 15-20% by echo in 06/2017. - recently admitted for evaluation of "itching" but also reported having worsening dyspnea on exertion and found to have a BNP greater than 4500. Diuresed well with IV Lasix and was transitioned to PO Lasix 20 mg daily at the time of discharge. She is unsure if she is taking the medication but weight has been stable on her home scales and is at 103 lbs today (102 lbs at the time of hospital discharge). She does have lower extremity edema but lungs are clear. I recommended she continue with Lasix at her current dosing. Sodium and fluid restriction were reviewed. Will recheck BMET today to assess kidney function and K+ levels. Continue with Toprol-XL. She is not on ACE-I/ARB/or ARNI secondary to renal dysfunction.   2. CAD - s/p catheterization in 2004 which showed diffuse nonobstructive CAD.  - she denies any recent chest pain or dyspnea on exertion. Would favor conservative measures in the setting of her advanced age and she is very clear in that she does not want a procedure performed or to be on additional medications unless absolutely necessary.  - continue with ASA and BB. Intolerant to statin therapy secondary to myalgias.   3. HTN - BP is well controlled at 130/62 during today's visit. - continue Toprol-XL 12.5mg  daily.  4. Mitral Regurgitation -  moderate by recent echocardiogram. No obvious symptoms reported.  - continue with conservative management.    5. Stage 3-4 CKD - creatinine peaked at 2.0 during recent admission, trending down to 1.78 at the time of hospital discharge on 10/18/2017. - recheck BMET today.   6. Pruritus - this has been a recurrent issue for the patient. Has been prescribed medications by her  PCP for this and referred to Dermatology with no improvement in her symptoms. I recommended she make her PCP aware that the most recent medication is not working and to see what alternative options are available.    Medication Adjustments/Labs and Tests Ordered: Current medicines are reviewed at length with the patient today.  Concerns regarding medicines are outlined above.  Medication changes, Labs and Tests ordered today are listed in the Patient Instructions below. Patient Instructions  Medication Instructions:  Your physician recommends that you continue on your current medications as directed. Please refer to the Current Medication list given to you today.  Labwork: Your physician recommends that you return for lab work Today  Testing/Procedures: None   Follow-Up: Your physician recommends that you schedule a follow-up appointment in: 3-4 Months   Any Other Special Instructions Will Be Listed Below (If Applicable).  If you need a refill on your cardiac medications before your next appointment, please call your pharmacy. Thank you for choosing [facility]!    Signed, Ellsworth Lennox, PA-C  10/27/2017 5:09 PM    [facility] [address] [place], [place] [address] Phone: [phone]

## 2017-10-27 ENCOUNTER — Ambulatory Visit (INDEPENDENT_AMBULATORY_CARE_PROVIDER_SITE_OTHER): Payer: Medicare Other | Admitting: Student

## 2017-10-27 ENCOUNTER — Other Ambulatory Visit (HOSPITAL_COMMUNITY)
Admission: RE | Admit: 2017-10-27 | Discharge: 2017-10-27 | Disposition: A | Discharge status: Home / Self Care | Payer: Medicare Other | Source: Ambulatory Visit | Attending: Student | Admitting: Student

## 2017-10-27 ENCOUNTER — Other Ambulatory Visit: Payer: Self-pay

## 2017-10-27 ENCOUNTER — Encounter: Payer: Self-pay | Admitting: Student

## 2017-10-27 VITALS — BP 130/62 | HR 79 | Ht 63.5 in | Wt 103.2 lb

## 2017-10-27 DIAGNOSIS — I2510 Atherosclerotic heart disease of native coronary artery without angina pectoris: Secondary | ICD-10-CM

## 2017-10-27 DIAGNOSIS — I340 Nonrheumatic mitral (valve) insufficiency: Secondary | ICD-10-CM

## 2017-10-27 DIAGNOSIS — N183 Chronic kidney disease, stage 3 unspecified: Secondary | ICD-10-CM

## 2017-10-27 DIAGNOSIS — I10 Essential (primary) hypertension: Secondary | ICD-10-CM | POA: Diagnosis not present

## 2017-10-27 DIAGNOSIS — I5042 Chronic combined systolic (congestive) and diastolic (congestive) heart failure: Secondary | ICD-10-CM | POA: Diagnosis not present

## 2017-10-27 DIAGNOSIS — Z79899 Other long term (current) drug therapy: Secondary | ICD-10-CM | POA: Insufficient documentation

## 2017-10-27 LAB — BASIC METABOLIC PANEL
ANION GAP: 12 (ref 5–15)
BUN: 44 mg/dL — ABNORMAL HIGH (ref 6–20)
CALCIUM: 9.7 mg/dL (ref 8.9–10.3)
CO2: 22 mmol/L (ref 22–32)
CREATININE: 1.65 mg/dL — AB (ref 0.44–1.00)
Chloride: 103 mmol/L (ref 101–111)
GFR, EST AFRICAN AMERICAN: 30 mL/min — AB (ref >60)
GFR, EST NON AFRICAN AMERICAN: 26 mL/min — AB (ref >60)
Glucose, Bld: 178 mg/dL — ABNORMAL HIGH (ref 65–99)
Potassium: 4.3 mmol/L (ref 3.5–5.1)
SODIUM: 137 mmol/L (ref 135–145)

## 2017-10-27 MED ORDER — METOPROLOL SUCCINATE ER 25 MG PO TB24: 12.5000 mg | ORAL_TABLET | Freq: Every day | ORAL | 3 refills | Status: AC

## 2017-10-27 MED ORDER — FUROSEMIDE 20 MG PO TABS: 20.0000 mg | ORAL_TABLET | Freq: Every day | ORAL | 3 refills | Status: DC

## 2017-10-27 NOTE — Patient Instructions (Signed)
Medication Instructions:  Your physician recommends that you continue on your current medications as directed. Please refer to the Current Medication list given to you today.   Labwork: Your physician recommends that you return for lab work Today   Testing/Procedures: None   Follow-Up: Your physician recommends that you schedule a follow-up appointment in: 3-4 Months    Any Other Special Instructions Will Be Listed Below (If Applicable).     If you need a refill on your cardiac medications before your next appointment, please call your pharmacy. Thank you for choosing [facility]!

## 2017-10-29 ENCOUNTER — Encounter (HOSPITAL_COMMUNITY): Payer: Self-pay | Admitting: Emergency Medicine

## 2017-10-29 ENCOUNTER — Emergency Department (HOSPITAL_COMMUNITY): Payer: Medicare Other

## 2017-10-29 ENCOUNTER — Emergency Department (HOSPITAL_COMMUNITY)
Admission: EM | Admit: 2017-10-29 | Discharge: 2017-10-29 | Discharge status: Against Medical Advice | Payer: Medicare Other | Attending: Emergency Medicine | Admitting: Emergency Medicine

## 2017-10-29 ENCOUNTER — Other Ambulatory Visit: Payer: Self-pay

## 2017-10-29 DIAGNOSIS — Z7982 Long term (current) use of aspirin: Secondary | ICD-10-CM | POA: Insufficient documentation

## 2017-10-29 DIAGNOSIS — I2510 Atherosclerotic heart disease of native coronary artery without angina pectoris: Secondary | ICD-10-CM | POA: Diagnosis not present

## 2017-10-29 DIAGNOSIS — I5042 Chronic combined systolic (congestive) and diastolic (congestive) heart failure: Secondary | ICD-10-CM | POA: Insufficient documentation

## 2017-10-29 DIAGNOSIS — E119 Type 2 diabetes mellitus without complications: Secondary | ICD-10-CM | POA: Diagnosis not present

## 2017-10-29 DIAGNOSIS — Z79899 Other long term (current) drug therapy: Secondary | ICD-10-CM | POA: Insufficient documentation

## 2017-10-29 DIAGNOSIS — R0602 Shortness of breath: Secondary | ICD-10-CM | POA: Diagnosis present

## 2017-10-29 DIAGNOSIS — I110 Hypertensive heart disease with heart failure: Secondary | ICD-10-CM | POA: Insufficient documentation

## 2017-10-29 LAB — CBC WITH DIFFERENTIAL/PLATELET
BASOS PCT: 0 %
Basophils Absolute: 0 10*3/uL (ref 0.0–0.1)
Eosinophils Absolute: 0.1 10*3/uL (ref 0.0–0.7)
Eosinophils Relative: 1 %
HEMATOCRIT: 33.1 % — AB (ref 36.0–46.0)
HEMOGLOBIN: 10.3 g/dL — AB (ref 12.0–15.0)
Lymphocytes Relative: 31 %
Lymphs Abs: 1.6 10*3/uL (ref 0.7–4.0)
MCH: 27.6 pg (ref 26.0–34.0)
MCHC: 31.1 g/dL (ref 30.0–36.0)
MCV: 88.7 fL (ref 78.0–100.0)
MONO ABS: 0.4 10*3/uL (ref 0.1–1.0)
MONOS PCT: 7 %
NEUTROS PCT: 61 %
Neutro Abs: 3.1 10*3/uL (ref 1.7–7.7)
Platelets: 141 10*3/uL — ABNORMAL LOW (ref 150–400)
RBC: 3.73 MIL/uL — ABNORMAL LOW (ref 3.87–5.11)
RDW: 13.3 % (ref 11.5–15.5)
WBC: 5.2 10*3/uL (ref 4.0–10.5)

## 2017-10-29 LAB — BASIC METABOLIC PANEL
ANION GAP: 10 (ref 5–15)
BUN: 38 mg/dL — AB (ref 6–20)
CO2: 21 mmol/L — ABNORMAL LOW (ref 22–32)
Calcium: 9.6 mg/dL (ref 8.9–10.3)
Chloride: 107 mmol/L (ref 101–111)
Creatinine, Ser: 1.58 mg/dL — ABNORMAL HIGH (ref 0.44–1.00)
GFR calc Af Amer: 32 mL/min — ABNORMAL LOW (ref >60)
GFR, EST NON AFRICAN AMERICAN: 27 mL/min — AB (ref >60)
GLUCOSE: 89 mg/dL (ref 65–99)
POTASSIUM: 5.2 mmol/L — AB (ref 3.5–5.1)
Sodium: 138 mmol/L (ref 135–145)

## 2017-10-29 LAB — TROPONIN I: Troponin I: 0.06 ng/mL (ref <0.03)

## 2017-10-29 LAB — BRAIN NATRIURETIC PEPTIDE: B Natriuretic Peptide: 3633 pg/mL — ABNORMAL HIGH (ref 0.0–100.0)

## 2017-10-29 NOTE — ED Triage Notes (Signed)
Patient released from hospital on Wednesday , admit for CHF. Patient reports SOB and chest pain. No chest pain currently.

## 2017-10-29 NOTE — ED Provider Notes (Signed)
[facility] EMERGENCY DEPARTMENT Provider Note   CSN: [id number] Arrival date & time: 10/29/17  1909     History   Chief Complaint Chief Complaint  Patient presents with  . Shortness of Breath    HPI [name] [name] is a 84 y.o. female.  HPI  Pt was seen at 1950. Per pt, c/o gradual onset and persistence of constant SOB that began this evening. Pt states she was walking in her home when she felt SOB. SOB was associated with chest "pain." Pt states the CP lasted for approximately 30 minutes before resolving with resting. Denies palpitations, no cough, no abd pain, no N/V/D, no fevers, no rash, no calf/LE pain or unilateral swelling.    Past Medical History:  Diagnosis Date  . Arthritis   . Coronary artery disease    Cath 2004--60% LAD--diffuse and calcified, 40% PDA  . Depression   . Hyperlipidemia   . Hypertension   . Nonischemic cardiomyopathy (HCC)    a. EF 30% 2004 b. 10/2006 EF=60-65% c. 06/2017: EF 15-20%, no regional WMA, Grade 1 DD, moderate MR  . Overactive bladder   . Pulmonary embolism (HCC)    status post  . Type II diabetes mellitus [facility])     Patient Active Problem List   Diagnosis Date Noted  . Acute on chronic combined systolic and diastolic CHF (congestive heart failure) (HCC) 10/15/2017  . Nonischemic cardiomyopathy (HCC) 10/15/2017  . Coronary artery disease 10/15/2017  . Chest pain 07/20/2017  . Type II diabetes mellitus (HCC)   . PE 10/01/2009  . Difficulty walking 10/01/2009  . Dysthymic disorder 07/06/2009  . UNSPECIFIED CHRONIC ISCHEMIC HEART DISEASE 07/06/2009  . Left bundle branch block (LBBB) 07/06/2009  . Coronary atherosclerosis 12/12/2007  . BACK PAIN, THORACIC REGION, CHRONIC 12/12/2007  . Hyperlipidemia 09/11/2007  . CATARACTS 09/11/2007  . Essential hypertension 09/11/2007  . OVERACTIVE BLADDER 09/11/2007  . Arthropathy 09/11/2007    Past Surgical History:  Procedure Laterality Date  . APPENDECTOMY  1959  . BACK SURGERY   1967; 1977  . CATARACT EXTRACTION W/ INTRAOCULAR LENS  IMPLANT, BILATERAL  2008   right eye  . CHOLECYSTECTOMY  1959  . TONSILLECTOMY      OB History    Gravida Para Term Preterm AB Living   2 2 2          SAB TAB Ectopic Multiple Live Births                   Home Medications    Prior to Admission medications   Medication Sig Start Date End Date Taking? Authorizing Provider  aspirin EC 81 MG EC tablet Take 1 tablet (81 mg total) by mouth daily. 10/19/17   Erick Blinks, MD  furosemide (LASIX) 20 MG tablet Take 1 tablet (20 mg total) by mouth daily. 10/27/17   Strader, Lennart Pall, PA-C  hydrocortisone (ANUSOL-HC) 2.5 % rectal cream Apply 1 application topically 2 (two) times daily as needed for hemorrhoids. 10/18/17   Erick Blinks, MD  hydrocortisone cream 1 % Apply 1 application topically 3 (three) times daily as needed for itching (minor skin irritation). 10/18/17   Erick Blinks, MD  metoprolol succinate (TOPROL-XL) 25 MG 24 hr tablet Take 0.5 tablets (12.5 mg total) by mouth daily. 10/27/17   Strader, Lennart Pall, PA-C  nitroGLYCERIN (NITROSTAT) 0.4 MG SL tablet Place 0.4 mg under the tongue every 5 (five) minutes as needed.    [provider]    Family History Family History  Problem Relation Age of Onset  . Stroke Mother        deceased    Social History Social History   Tobacco Use  . Smoking status: Never Smoker  . Smokeless tobacco: Never Used  Substance Use Topics  . Alcohol use: No  . Drug use: No     Allergies   Patient has no known allergies.   Review of Systems Review of Systems ROS: Statement: All systems negative except as marked or noted in the HPI; Constitutional: Negative for fever and chills. ; ; Eyes: Negative for eye pain, redness and discharge. ; ; ENMT: Negative for ear pain, hoarseness, nasal congestion, sinus pressure and sore throat. ; ; Cardiovascular: Negative for palpitations, diaphoresis, +CP, dyspnea and peripheral edema. ; ;  Respiratory: Negative for cough, wheezing and stridor. ; ; Gastrointestinal: Negative for nausea, vomiting, diarrhea, abdominal pain, blood in stool, hematemesis, jaundice and rectal bleeding. . ; ; Genitourinary: Negative for dysuria, flank pain and hematuria. ; ; Musculoskeletal: Negative for back pain and neck pain. Negative for swelling and trauma.; ; Skin: Negative for pruritus, rash, abrasions, blisters, bruising and skin lesion.; ; Neuro: Negative for headache, lightheadedness and neck stiffness. Negative for weakness, altered level of consciousness, altered mental status, extremity weakness, paresthesias, involuntary movement, seizure and syncope.       Physical Exam Updated Vital Signs BP 124/75 (BP Location: Right Arm)   Pulse 89   Temp (!) 97.5 F (36.4 C) (Oral)   Resp 20   Ht 5' 3.5" (1.613 m)   Wt 46.9 kg (103 lb 4.8 oz)   SpO2 100%   BMI 18.01 kg/m   Physical Exam 1953; Physical examination:  Nursing notes reviewed; Vital signs and O2 SAT reviewed;  Constitutional: Well developed, Well nourished, Well hydrated, In no acute distress; Head:  Normocephalic, atraumatic; Eyes: EOMI, PERRL, No scleral icterus; ENMT: Mouth and pharynx normal, Mucous membranes moist; Neck: Supple, Full range of motion, No lymphadenopathy; Cardiovascular: Regular rate and rhythm, No gallop; Respiratory: Breath sounds clear & equal bilaterally, No wheezes.  Speaking full sentences with ease, Normal respiratory effort/excursion; Chest: Nontender, Movement normal; Abdomen: Soft, Nontender, Nondistended, Normal bowel sounds; Genitourinary: No CVA tenderness; Extremities: Pulses normal, No tenderness, +1 pedal edema bilat. No calf edema or asymmetry.; Neuro: AA&Ox3, Major CN grossly intact.  Speech clear. No gross focal motor or sensory deficits in extremities.; Skin: Color normal, Warm, Dry.   ED Treatments / Results  Labs (all labs ordered are listed, but only abnormal results are displayed)   EKG   EKG Interpretation None       Radiology   Procedures Procedures (including critical care time)  Medications Ordered in ED Medications - No data to display   Initial Impression / Assessment and Plan / ED Course  I have reviewed the triage vital signs and the nursing notes.  Pertinent labs & imaging results that were available during my care of the patient were reviewed by me and considered in my medical decision making (see chart for details).  MDM Reviewed: nursing note, vitals and previous chart Reviewed previous: labs and ECG Interpretation: labs, ECG and x-ray   ED ECG REPORT   Date: 10/29/2017  Rate: 97  Rhythm: normal sinus rhythm  QRS Axis: left  Intervals: normal  ST/T Wave abnormalities: normal  Conduction Disutrbances:left bundle branch block  Narrative Interpretation:   Old EKG Reviewed: unchanged; no significant changes from previous EKG dated 10/15/2017. I have personally reviewed the EKG tracing and agree  with the computerized printout as noted.   Results for orders placed or performed during the hospital encounter of 10/29/17  CBC with Differential  Result Value Ref Range   WBC 5.2 4.0 - 10.5 K/uL   RBC 3.73 (L) 3.87 - 5.11 MIL/uL   Hemoglobin 10.3 (L) 12.0 - 15.0 g/dL   HCT 16.1 (L) 09.6 - 04.5 %   MCV 88.7 78.0 - 100.0 fL   MCH 27.6 26.0 - 34.0 pg   MCHC 31.1 30.0 - 36.0 g/dL   RDW 40.9 81.1 - 91.4 %   Platelets 141 (L) 150 - 400 K/uL   Neutrophils Relative % 61 %   Neutro Abs 3.1 1.7 - 7.7 K/uL   Lymphocytes Relative 31 %   Lymphs Abs 1.6 0.7 - 4.0 K/uL   Monocytes Relative 7 %   Monocytes Absolute 0.4 0.1 - 1.0 K/uL   Eosinophils Relative 1 %   Eosinophils Absolute 0.1 0.0 - 0.7 K/uL   Basophils Relative 0 %   Basophils Absolute 0.0 0.0 - 0.1 K/uL   Dg Chest 2 View Result Date: 10/29/2017 CLINICAL DATA:  Per ED notes: Patient released from hospital on Wednesday , admit for CHF. Patient reports SOB and chest pain. No chest pain currently  EXAM: CHEST  2 VIEW COMPARISON:  10/16/2017 FINDINGS: The heart is enlarged. There is mild interstitial pulmonary edema. Bilateral pleural effusions, left greater than right. There has been some improvement in the appearance of airspace filling opacities and consolidation in the left lower lobe. IMPRESSION: 1. Improved appearance of left lower lobe consolidation. 2. Cardiomegaly and mild edema persist. 3. Bilateral pleural effusions left greater than right. Electronically Signed   By: Norva Pavlov M.D.   On: 10/29/2017 20:56     2145:  Pt states she does not want to stay any longer and wants to leave now.  ED RN and I recommend she wait for her dx testing to be completed/resulted. Pt refuses to stay any longer.  Pt makes her own medical decisions.  Risks of AMA explained to pt and family, including, but not limited to:  stroke, heart attack, cardiac arrythmia ("irregular heart rate/beat"), "passing out," temporary and/or permanent disability, death.  Pt and family verb understanding and continue to refuse to stay and wait for dx testing results, understanding the consequences of their decision.  I encouraged pt to follow up with her PMD or Cards MD tomorrow and return to the ED immediately if symptoms return, or for any other concerns.  Pt and family verb understanding, agreeable.    Final Clinical Impressions(s) / ED Diagnoses   Final diagnoses:  None    ED Discharge Orders    None       Samuel Jester, DO 10/31/17 1528

## 2017-10-29 NOTE — ED Notes (Signed)
Pt stated she was tired of waiting we wasn't doing anything for her. MD made aware. This nurse advised pt she would have to sign out against medical advice if she wanted to leave and pt states she wanted to leave. Pt notified the risks of leaving.

## 2017-10-29 NOTE — ED Notes (Signed)
Attempted to call pt to make aware that pt has abnormal lab. Unable to reach pt by the number in chart. This nurse called the next number on pt chart and they said they would try to contact her to get in touch with this nurse.

## 2017-11-01 ENCOUNTER — Telehealth: Payer: Self-pay | Admitting: Student

## 2017-11-01 ENCOUNTER — Other Ambulatory Visit: Payer: Self-pay

## 2017-11-01 ENCOUNTER — Observation Stay (HOSPITAL_COMMUNITY)
Admission: EM | Admit: 2017-11-01 | Discharge: 2017-11-02 | Disposition: A | Discharge status: Home / Self Care | Payer: Medicare Other | Attending: Internal Medicine | Admitting: Internal Medicine

## 2017-11-01 ENCOUNTER — Emergency Department (HOSPITAL_COMMUNITY): Payer: Medicare Other

## 2017-11-01 ENCOUNTER — Encounter (HOSPITAL_COMMUNITY): Payer: Self-pay | Admitting: Emergency Medicine

## 2017-11-01 DIAGNOSIS — I5041 Acute combined systolic (congestive) and diastolic (congestive) heart failure: Secondary | ICD-10-CM | POA: Insufficient documentation

## 2017-11-01 DIAGNOSIS — N179 Acute kidney failure, unspecified: Secondary | ICD-10-CM | POA: Diagnosis not present

## 2017-11-01 DIAGNOSIS — I420 Dilated cardiomyopathy: Secondary | ICD-10-CM | POA: Insufficient documentation

## 2017-11-01 DIAGNOSIS — E119 Type 2 diabetes mellitus without complications: Secondary | ICD-10-CM

## 2017-11-01 DIAGNOSIS — R799 Abnormal finding of blood chemistry, unspecified: Secondary | ICD-10-CM | POA: Diagnosis present

## 2017-11-01 DIAGNOSIS — I130 Hypertensive heart and chronic kidney disease with heart failure and stage 1 through stage 4 chronic kidney disease, or unspecified chronic kidney disease: Secondary | ICD-10-CM | POA: Diagnosis not present

## 2017-11-01 DIAGNOSIS — I2510 Atherosclerotic heart disease of native coronary artery without angina pectoris: Secondary | ICD-10-CM | POA: Insufficient documentation

## 2017-11-01 DIAGNOSIS — Z7901 Long term (current) use of anticoagulants: Secondary | ICD-10-CM | POA: Diagnosis not present

## 2017-11-01 DIAGNOSIS — I5043 Acute on chronic combined systolic (congestive) and diastolic (congestive) heart failure: Secondary | ICD-10-CM | POA: Diagnosis not present

## 2017-11-01 DIAGNOSIS — Z79899 Other long term (current) drug therapy: Secondary | ICD-10-CM | POA: Insufficient documentation

## 2017-11-01 DIAGNOSIS — I110 Hypertensive heart disease with heart failure: Principal | ICD-10-CM | POA: Insufficient documentation

## 2017-11-01 DIAGNOSIS — E1122 Type 2 diabetes mellitus with diabetic chronic kidney disease: Secondary | ICD-10-CM | POA: Diagnosis not present

## 2017-11-01 DIAGNOSIS — R7989 Other specified abnormal findings of blood chemistry: Secondary | ICD-10-CM | POA: Diagnosis not present

## 2017-11-01 DIAGNOSIS — I340 Nonrheumatic mitral (valve) insufficiency: Secondary | ICD-10-CM | POA: Insufficient documentation

## 2017-11-01 DIAGNOSIS — I10 Essential (primary) hypertension: Secondary | ICD-10-CM | POA: Diagnosis present

## 2017-11-01 DIAGNOSIS — N184 Chronic kidney disease, stage 4 (severe): Secondary | ICD-10-CM | POA: Insufficient documentation

## 2017-11-01 DIAGNOSIS — I509 Heart failure, unspecified: Secondary | ICD-10-CM

## 2017-11-01 LAB — CBC WITH DIFFERENTIAL/PLATELET
BASOS ABS: 0 10*3/uL (ref 0.0–0.1)
BASOS PCT: 1 %
Eosinophils Absolute: 0.1 10*3/uL (ref 0.0–0.7)
Eosinophils Relative: 1 %
HCT: 34.2 % — ABNORMAL LOW (ref 36.0–46.0)
Hemoglobin: 10.6 g/dL — ABNORMAL LOW (ref 12.0–15.0)
LYMPHS PCT: 23 %
Lymphs Abs: 1.3 10*3/uL (ref 0.7–4.0)
MCH: 27.7 pg (ref 26.0–34.0)
MCHC: 31 g/dL (ref 30.0–36.0)
MCV: 89.3 fL (ref 78.0–100.0)
MONOS PCT: 9 %
Monocytes Absolute: 0.5 10*3/uL (ref 0.1–1.0)
NEUTROS PCT: 66 %
Neutro Abs: 3.7 10*3/uL (ref 1.7–7.7)
Platelets: 150 10*3/uL (ref 150–400)
RBC: 3.83 MIL/uL — ABNORMAL LOW (ref 3.87–5.11)
RDW: 13.7 % (ref 11.5–15.5)
WBC: 5.6 10*3/uL (ref 4.0–10.5)

## 2017-11-01 LAB — BASIC METABOLIC PANEL
Anion gap: 10 (ref 5–15)
BUN: 42 mg/dL — AB (ref 6–20)
CHLORIDE: 109 mmol/L (ref 101–111)
CO2: 21 mmol/L — AB (ref 22–32)
Calcium: 9.6 mg/dL (ref 8.9–10.3)
Creatinine, Ser: 1.59 mg/dL — ABNORMAL HIGH (ref 0.44–1.00)
GFR calc Af Amer: 32 mL/min — ABNORMAL LOW (ref >60)
GFR calc non Af Amer: 27 mL/min — ABNORMAL LOW (ref >60)
GLUCOSE: 120 mg/dL — AB (ref 65–99)
POTASSIUM: 4.4 mmol/L (ref 3.5–5.1)
SODIUM: 140 mmol/L (ref 135–145)

## 2017-11-01 LAB — BRAIN NATRIURETIC PEPTIDE: B Natriuretic Peptide: 3734 pg/mL — ABNORMAL HIGH (ref 0.0–100.0)

## 2017-11-01 LAB — TROPONIN I: TROPONIN I: 0.07 ng/mL — AB (ref <0.03)

## 2017-11-01 MED ORDER — ONDANSETRON HCL 4 MG/2ML IJ SOLN: 4.0000 mg | Freq: Four times a day (QID) | INTRAMUSCULAR | Status: DC | PRN

## 2017-11-01 MED ORDER — ONDANSETRON HCL 4 MG PO TABS: 4.0000 mg | ORAL_TABLET | Freq: Four times a day (QID) | ORAL | Status: DC | PRN

## 2017-11-01 MED ORDER — SODIUM CHLORIDE 0.9 % IV SOLN: 250.0000 mL | INTRAVENOUS | Status: DC | PRN

## 2017-11-01 MED ORDER — SODIUM CHLORIDE 0.9% FLUSH
3.0000 mL | Freq: Two times a day (BID) | INTRAVENOUS | Status: DC
  Administered 2017-11-01 – 2017-11-02 (×2): 3 mL via INTRAVENOUS

## 2017-11-01 MED ORDER — METOPROLOL SUCCINATE ER 25 MG PO TB24
12.5000 mg | ORAL_TABLET | Freq: Every day | ORAL | Status: DC
  Administered 2017-11-02: 12.5 mg via ORAL
  Filled 2017-11-01: qty 1

## 2017-11-01 MED ORDER — ENOXAPARIN SODIUM 30 MG/0.3ML ~~LOC~~ SOLN
30.0000 mg | SUBCUTANEOUS | Status: DC
  Administered 2017-11-01: 30 mg via SUBCUTANEOUS
  Filled 2017-11-01: qty 0.3

## 2017-11-01 MED ORDER — ASPIRIN 81 MG PO CHEW
324.0000 mg | CHEWABLE_TABLET | Freq: Once | ORAL | Status: AC
  Administered 2017-11-01: 324 mg via ORAL
  Filled 2017-11-01: qty 4

## 2017-11-01 MED ORDER — FUROSEMIDE 20 MG PO TABS
20.0000 mg | ORAL_TABLET | Freq: Every day | ORAL | Status: DC
  Administered 2017-11-02: 20 mg via ORAL
  Filled 2017-11-01: qty 1

## 2017-11-01 MED ORDER — FUROSEMIDE 10 MG/ML IJ SOLN
20.0000 mg | Freq: Once | INTRAMUSCULAR | Status: AC
  Administered 2017-11-01: 20 mg via INTRAVENOUS
  Filled 2017-11-01: qty 2

## 2017-11-01 MED ORDER — SODIUM CHLORIDE 0.9% FLUSH: 3.0000 mL | INTRAVENOUS | Status: DC | PRN

## 2017-11-01 MED ORDER — ASPIRIN EC 81 MG PO TBEC
81.0000 mg | DELAYED_RELEASE_TABLET | Freq: Every day | ORAL | Status: DC
  Administered 2017-11-02: 81 mg via ORAL
  Filled 2017-11-01: qty 1

## 2017-11-01 MED ORDER — HYDROCORTISONE 2.5 % RE CREA
1.0000 "application " | TOPICAL_CREAM | Freq: Two times a day (BID) | RECTAL | Status: DC | PRN
  Filled 2017-11-01: qty 28.35

## 2017-11-01 NOTE — Telephone Encounter (Signed)
Pt was seen in the ER and was told due to her labs she needed to be seen this week. Please give [name] [name] a call @ [phone]

## 2017-11-01 NOTE — ED Triage Notes (Signed)
Patient reports she was called for abnormal lab results. BNP elevated. BUN and CR elevated.

## 2017-11-01 NOTE — ED Notes (Signed)
Warm up dinner tray for patient she refused to eat it, offered her a [place] sandwich and she refused this also, gave 2 sodas to drink.

## 2017-11-01 NOTE — ED Notes (Signed)
Critical result. Troponin 0.07. MD Criss Alvine notified.

## 2017-11-01 NOTE — H&P (Signed)
TRH H&P    [name] [name] Demographics:    [name] [name], is a 84 y.o. female  MRN: [id number]  DOB - Jan 27, 1924  Admit Date - 11/01/2017  Referring MD/NP/PA: Dr Criss Alvine  Outpatient Primary MD for the [name] [name] is [name] [name], No Pcp Per  [name] [name] coming from: home  Chief complaint-shortness of breath   HPI:    [name] [name]  is a 84 y.o. female, with history of CAD, non-ischemic cardiomyopathy, EF 15 to 20%, grade 2 diastolic dysfunction, came to hospital after she was called by POA to go to hospital. ED physician discussed with [name] [name]'s POA [name] [name], who says that he was called by [facility] for abnormal labs. And she was told to come back to the hospital. [name] [name] was seen in the hospital on 10/29/17, at this time she had shortness of breath and left against medical advice. She denies chest pain or shortness of breath at this time. She denies PND orthopnea. She denies dysuria denies cough denies nausea vomiting or diarrhea   Review of systems:     All other systems reviewed and are negative.   With Past History of the following :    Past Medical History:  Diagnosis Date  . Arthritis   . Coronary artery disease    Cath 2004--60% LAD--diffuse and calcified, 40% PDA  . Depression   . Hyperlipidemia   . Hypertension   . Nonischemic cardiomyopathy (HCC)    a. EF 30% 2004 b. 10/2006 EF=60-65% c. 06/2017: EF 15-20%, no regional WMA, Grade 1 DD, moderate MR  . Overactive bladder   . Pulmonary embolism (HCC)    status post  . Type II diabetes mellitus (HCC)       Past Surgical History:  Procedure Laterality Date  . APPENDECTOMY  1959  . BACK SURGERY  1967; 1977  . CATARACT EXTRACTION W/ INTRAOCULAR LENS  IMPLANT, BILATERAL  2008   right eye  . CHOLECYSTECTOMY  1959  . TONSILLECTOMY        Social History:      Social History   Tobacco Use  . Smoking status: Never Smoker  . Smokeless tobacco:  Never Used  Substance Use Topics  . Alcohol use: No       Family History :     Family History  Problem Relation Age of Onset  . Stroke Mother        deceased      Home Medications:   Prior to Admission medications   Medication Sig Start Date End Date Taking? Authorizing Provider  aspirin EC 81 MG EC tablet Take 1 tablet (81 mg total) by mouth daily. 10/19/17   Erick Blinks, MD  furosemide (LASIX) 20 MG tablet Take 1 tablet (20 mg total) by mouth daily. 10/27/17   Strader, Lennart Pall, PA-C  hydrocortisone (ANUSOL-HC) 2.5 % rectal cream Apply 1 application topically 2 (two) times daily as needed for hemorrhoids. 10/18/17   Erick Blinks, MD  hydrocortisone cream 1 % Apply 1 application topically 3 (three) times daily as needed for itching (minor skin irritation).  10/18/17   Erick Blinks, MD  metoprolol succinate (TOPROL-XL) 25 MG 24 hr tablet Take 0.5 tablets (12.5 mg total) by mouth daily. 10/27/17   Strader, Lennart Pall, PA-C  nitroGLYCERIN (NITROSTAT) 0.4 MG SL tablet Place 0.4 mg under the tongue every 5 (five) minutes as needed.    [provider]     Allergies:    No Known Allergies   Physical Exam:   Vitals  Blood pressure 106/68, pulse 95, temperature 99.5 F (37.5 C), temperature source Temporal, resp. rate 20, height 5' 3.5" (1.613 m), weight 46.7 kg (103 lb), SpO2 (!) 87 %.  1.  General: appears in no acute distress  2. Psychiatric:  Intact judgement and  insight, awake alert, oriented x 3.  3. Neurologic: No focal neurological deficits, all cranial nerves intact.Strength 5/5 all 4 extremities, sensation intact all 4 extremities, plantars down going.  4. Eyes :  anicteric sclerae, moist conjunctivae with no lid lag. PERRLA.  5. ENMT:  Oropharynx clear with moist mucous membranes and good dentition  6. Neck:  supple, no cervical lymphadenopathy appriciated, No thyromegaly  7. Respiratory : Normal respiratory effort, good air movement  bilaterally,clear to  auscultation bilaterally  8. Cardiovascular : RRR, no gallops, rubs or murmurs, no leg edema  9. Gastrointestinal:  Positive bowel sounds, abdomen soft, non-tender to palpation,no hepatosplenomegaly, no rigidity or guarding       10. Skin:  No cyanosis, normal texture and turgor, no rash, lesions or ulcers  11.Musculoskeletal:  Good muscle tone,  joints appear normal , no effusions,  normal range of motion    Data Review:    CBC Recent Labs  Lab 10/29/17 2050 11/01/17 1808  WBC 5.2 5.6  HGB 10.3* 10.6*  HCT 33.1* 34.2*  PLT 141* 150  MCV 88.7 89.3  MCH 27.6 27.7  MCHC 31.1 31.0  RDW 13.3 13.7  LYMPHSABS 1.6 1.3  MONOABS 0.4 0.5  EOSABS 0.1 0.1  BASOSABS 0.0 0.0   ------------------------------------------------------------------------------------------------------------------  Chemistries  Recent Labs  Lab 10/27/17 1518 10/29/17 2050 11/01/17 1808  NA 137 138 140  K 4.3 5.2* 4.4  CL 103 107 109  CO2 22 21* 21*  GLUCOSE 178* 89 120*  BUN 44* 38* 42*  CREATININE 1.65* 1.58* 1.59*  CALCIUM 9.7 9.6 9.6   ------------------------------------------------------------------------------------------------------------------  Recent Labs  Lab 10/29/17 2050 11/01/17 1808  TROPONINI 0.06* 0.07*    --------------------------------------------------------------------------------------------------------------- Urine analysis:    Component Value Date/Time   COLORURINE YELLOW 10/15/2017 0940   APPEARANCEUR CLEAR 10/15/2017 0940   LABSPEC 1.016 10/15/2017 0940   PHURINE 5.0 10/15/2017 0940   GLUCOSEU NEGATIVE 10/15/2017 0940   HGBUR SMALL (A) 10/15/2017 0940   BILIRUBINUR NEGATIVE 10/15/2017 0940   KETONESUR NEGATIVE 10/15/2017 0940   PROTEINUR NEGATIVE 10/15/2017 0940   UROBILINOGEN 0.2 12/30/2007 1850   NITRITE NEGATIVE 10/15/2017 0940   LEUKOCYTESUR NEGATIVE 10/15/2017 0940      Imaging Results:    Dg Chest 2 View  Result Date:  11/01/2017 CLINICAL DATA:  Chest pain.  Shortness of breath. EXAM: CHEST  2 VIEW COMPARISON:  October 29, 2017 FINDINGS: Small left effusion and associated infiltrate in the left base. No other interval changes or acute abnormalities. IMPRESSION: Small left effusion and associated infiltrate in the lateral left lung base. Recommend follow-up to resolution. Electronically Signed   By: Gerome Sam III M.D   On: 11/01/2017 18:54    My personal review of EKG: Rhythm NSR   Assessment & Plan:  Active Problems:   CHF exacerbation (HCC)   1. CHF-likely euvolemic, BNP is 3,734, troponin .07. [name] [name] received Lasix in the ED. Denies shortness of breath. Will continue with the home dose of Lasix 20 mg PO daily from tomorrow morning. Will consult cardiology for further recommendations. 2. Elevated troponin- mild elevation of troponin, likely from demand ischemia. [name] [name] does not have chest pain. Will cycle troponin Q6 hours time three. 3. CAD-stable, continue aspirin, metoprolol    DVT Prophylaxis-   Lovenox   AM Labs Ordered, also please review Full Orders  Family Communication: Admission, patients condition and plan of care including tests being ordered have been discussed with the [name] [name]  who indicate understanding and agree with the plan and Code Status.  Code Status: full code  Admission status: observation  Time spent in minutes : 60 minutes   Meredeth Ide M.D on 11/01/2017 at 9:17 PM  Between 7am to 7pm - Pager - [phone]. After 7pm go to [email or web address] - password [facility]  [facility] - Office  [phone]

## 2017-11-01 NOTE — Telephone Encounter (Signed)
Son notified of test results.  

## 2017-11-01 NOTE — ED Provider Notes (Signed)
[facility] EMERGENCY DEPARTMENT Provider Note   CSN: [id number] Arrival date & time: 11/01/17  1609     History   Chief Complaint Chief Complaint  Patient presents with  . Abnormal Lab    HPI [name] [name] is a 84 y.o. female.  HPI  84 year old female with a history of type 2 diabetes and nonischemic cardiomyopathy with CHF presents with abnormal labs.  The patient states she was told to come in by her power of attorney, Whitney Muse.  When I discussed with Mr. Julien Girt, he states he was called by [facility] and told to come back due to lab work that was abnormal from her visit on 2/3.  At that time she was here for shortness of breath and left AGAINST MEDICAL ADVICE.  The patient states that she was short of breath when she was seen here on 2/3 but denies dyspnea now.  She denies any chest pain.  She states she overall feels well and denies any leg swelling or other acute complaints.  Past Medical History:  Diagnosis Date  . Arthritis   . Coronary artery disease    Cath 2004--60% LAD--diffuse and calcified, 40% PDA  . Depression   . Hyperlipidemia   . Hypertension   . Nonischemic cardiomyopathy (HCC)    a. EF 30% 2004 b. 10/2006 EF=60-65% c. 06/2017: EF 15-20%, no regional WMA, Grade 1 DD, moderate MR  . Overactive bladder   . Pulmonary embolism (HCC)    status post  . Type II diabetes mellitus [facility])     Patient Active Problem List   Diagnosis Date Noted  . Acute on chronic combined systolic and diastolic CHF (congestive heart failure) (HCC) 10/15/2017  . Nonischemic cardiomyopathy (HCC) 10/15/2017  . Coronary artery disease 10/15/2017  . Chest pain 07/20/2017  . Type II diabetes mellitus (HCC)   . PE 10/01/2009  . Difficulty walking 10/01/2009  . Dysthymic disorder 07/06/2009  . UNSPECIFIED CHRONIC ISCHEMIC HEART DISEASE 07/06/2009  . Left bundle branch block (LBBB) 07/06/2009  . Coronary atherosclerosis 12/12/2007  . BACK PAIN, THORACIC REGION, CHRONIC 12/12/2007    . Hyperlipidemia 09/11/2007  . CATARACTS 09/11/2007  . Essential hypertension 09/11/2007  . OVERACTIVE BLADDER 09/11/2007  . Arthropathy 09/11/2007    Past Surgical History:  Procedure Laterality Date  . APPENDECTOMY  1959  . BACK SURGERY  1967; 1977  . CATARACT EXTRACTION W/ INTRAOCULAR LENS  IMPLANT, BILATERAL  2008   right eye  . CHOLECYSTECTOMY  1959  . TONSILLECTOMY      OB History    Gravida Para Term Preterm AB Living   2 2 2          SAB TAB Ectopic Multiple Live Births                   Home Medications    Prior to Admission medications   Medication Sig Start Date End Date Taking? Authorizing Provider  aspirin EC 81 MG EC tablet Take 1 tablet (81 mg total) by mouth daily. 10/19/17   Erick Blinks, MD  furosemide (LASIX) 20 MG tablet Take 1 tablet (20 mg total) by mouth daily. 10/27/17   Strader, Lennart Pall, PA-C  hydrocortisone (ANUSOL-HC) 2.5 % rectal cream Apply 1 application topically 2 (two) times daily as needed for hemorrhoids. 10/18/17   Erick Blinks, MD  hydrocortisone cream 1 % Apply 1 application topically 3 (three) times daily as needed for itching (minor skin irritation). 10/18/17   Erick Blinks, MD  metoprolol  succinate (TOPROL-XL) 25 MG 24 hr tablet Take 0.5 tablets (12.5 mg total) by mouth daily. 10/27/17   Strader, Lennart Pall, PA-C  nitroGLYCERIN (NITROSTAT) 0.4 MG SL tablet Place 0.4 mg under the tongue every 5 (five) minutes as needed.    [provider]    Family History Family History  Problem Relation Age of Onset  . Stroke Mother        deceased    Social History Social History   Tobacco Use  . Smoking status: Never Smoker  . Smokeless tobacco: Never Used  Substance Use Topics  . Alcohol use: No  . Drug use: No     Allergies   Patient has no known allergies.   Review of Systems Review of Systems  Constitutional: Negative for fever.  Respiratory: Negative for cough and shortness of breath.   Cardiovascular:  Negative for chest pain and leg swelling.  All other systems reviewed and are negative.    Physical Exam Updated Vital Signs BP 116/66   Pulse 91   Temp 99.5 F (37.5 C) (Temporal)   Resp (!) 25   Ht 5' 3.5" (1.613 m)   Wt 46.7 kg (103 lb)   SpO2 96%   BMI 17.96 kg/m   Physical Exam  Constitutional: She appears well-developed.  frail  HENT:  Head: Normocephalic and atraumatic.  Right Ear: External ear normal.  Left Ear: External ear normal.  Nose: Nose normal.  Eyes: Right eye exhibits no discharge. Left eye exhibits no discharge.  Cardiovascular: Normal rate, regular rhythm and normal heart sounds.  Pulmonary/Chest: No accessory muscle usage. Tachypnea noted. She has rales.  Abdominal: Soft. She exhibits no distension. There is no tenderness.  Musculoskeletal: She exhibits edema (mild pitting edema to bilateral ankles).  Neurological: She is alert.  Skin: Skin is warm and dry.  Nursing note and vitals reviewed.    ED Treatments / Results  Labs (all labs ordered are listed, but only abnormal results are displayed) Labs Reviewed  BASIC METABOLIC PANEL - Abnormal; Notable for the following components:      Result Value   CO2 21 (*)    Glucose, Bld 120 (*)    BUN 42 (*)    Creatinine, Ser 1.59 (*)    GFR calc non Af Amer 27 (*)    GFR calc Af Amer 32 (*)    All other components within normal limits  TROPONIN I - Abnormal; Notable for the following components:   Troponin I 0.07 (*)    All other components within normal limits  BRAIN NATRIURETIC PEPTIDE - Abnormal; Notable for the following components:   B Natriuretic Peptide 3,734.0 (*)    All other components within normal limits  CBC WITH DIFFERENTIAL/PLATELET - Abnormal; Notable for the following components:   RBC 3.83 (*)    Hemoglobin 10.6 (*)    HCT 34.2 (*)    All other components within normal limits    EKG  EKG Interpretation  Date/Time:  Wednesday November 01 2017 17:45:10 EST Ventricular Rate:   90 PR Interval:  160 QRS Duration: 145 QT Interval:  399 QTC Calculation: 489 R Axis:   -70 Text Interpretation:  Sinus rhythm Atrial premature complex Left bundle branch block no significant change compared to Oct 29 2017 previous changes likely lead placement Confirmed by Pricilla Loveless [id number]) on 11/01/2017 6:07:13 PM       Radiology Dg Chest 2 View  Result Date: 11/01/2017 CLINICAL DATA:  Chest pain.  Shortness of breath.  EXAM: CHEST  2 VIEW COMPARISON:  October 29, 2017 FINDINGS: Small left effusion and associated infiltrate in the left base. No other interval changes or acute abnormalities. IMPRESSION: Small left effusion and associated infiltrate in the lateral left lung base. Recommend follow-up to resolution. Electronically Signed   By: Gerome Sam III M.D   On: 11/01/2017 18:54    Procedures Procedures (including critical care time)  Medications Ordered in ED Medications  aspirin chewable tablet 324 mg (324 mg Oral Given 11/01/17 1953)  furosemide (LASIX) injection 20 mg (20 mg Intravenous Given 11/01/17 1953)     Initial Impression / Assessment and Plan / ED Course  I have reviewed the triage vital signs and the nursing notes.  Pertinent labs & imaging results that were available during my care of the patient were reviewed by me and considered in my medical decision making (see chart for details).     Patient's presentation is consistent with CHF exacerbation.  Her most recent EF last month was 15-20%.  Her labs appear relatively stable compared to a few days ago when she was in the ED.  However given she is tachypneic with overall poor EF, I think she will need IV diuresis.  Her low level troponin elevation is likely from heart strain from the CHF.  Otherwise her electrolytes showed no significant change.  There is a possible pneumonia on her chest x-ray but I think this is rather a pleural effusion.  Multiple prior chest x-rays show similar infiltrate versus atelectasis  in the left lung specifically.  With no cough and normal white blood cell count, I think pneumonia is less likely.  Admit to the hospitalist.  Final Clinical Impressions(s) / ED Diagnoses   Final diagnoses:  Acute on chronic combined systolic and diastolic congestive heart failure [facility])    ED Discharge Orders    None       Pricilla Loveless, MD 11/01/17 2003

## 2017-11-02 DIAGNOSIS — I2510 Atherosclerotic heart disease of native coronary artery without angina pectoris: Secondary | ICD-10-CM | POA: Diagnosis not present

## 2017-11-02 DIAGNOSIS — N183 Chronic kidney disease, stage 3 (moderate): Secondary | ICD-10-CM | POA: Diagnosis not present

## 2017-11-02 DIAGNOSIS — I5043 Acute on chronic combined systolic (congestive) and diastolic (congestive) heart failure: Secondary | ICD-10-CM | POA: Diagnosis not present

## 2017-11-02 DIAGNOSIS — I110 Hypertensive heart disease with heart failure: Secondary | ICD-10-CM | POA: Diagnosis not present

## 2017-11-02 DIAGNOSIS — I10 Essential (primary) hypertension: Secondary | ICD-10-CM

## 2017-11-02 DIAGNOSIS — I340 Nonrheumatic mitral (valve) insufficiency: Secondary | ICD-10-CM | POA: Diagnosis not present

## 2017-11-02 DIAGNOSIS — E119 Type 2 diabetes mellitus without complications: Secondary | ICD-10-CM

## 2017-11-02 LAB — CBC
HEMATOCRIT: 32.4 % — AB (ref 36.0–46.0)
HEMOGLOBIN: 10.1 g/dL — AB (ref 12.0–15.0)
MCH: 27.6 pg (ref 26.0–34.0)
MCHC: 31.2 g/dL (ref 30.0–36.0)
MCV: 88.5 fL (ref 78.0–100.0)
Platelets: 146 10*3/uL — ABNORMAL LOW (ref 150–400)
RBC: 3.66 MIL/uL — AB (ref 3.87–5.11)
RDW: 13.6 % (ref 11.5–15.5)
WBC: 5 10*3/uL (ref 4.0–10.5)

## 2017-11-02 LAB — COMPREHENSIVE METABOLIC PANEL
ALK PHOS: 82 U/L (ref 38–126)
ALT: 21 U/L (ref 14–54)
AST: 26 U/L (ref 15–41)
Albumin: 3.1 g/dL — ABNORMAL LOW (ref 3.5–5.0)
Anion gap: 9 (ref 5–15)
BUN: 41 mg/dL — ABNORMAL HIGH (ref 6–20)
CALCIUM: 9.2 mg/dL (ref 8.9–10.3)
CO2: 23 mmol/L (ref 22–32)
CREATININE: 1.55 mg/dL — AB (ref 0.44–1.00)
Chloride: 107 mmol/L (ref 101–111)
GFR, EST AFRICAN AMERICAN: 33 mL/min — AB (ref >60)
GFR, EST NON AFRICAN AMERICAN: 28 mL/min — AB (ref >60)
Glucose, Bld: 88 mg/dL (ref 65–99)
Potassium: 3.8 mmol/L (ref 3.5–5.1)
Sodium: 139 mmol/L (ref 135–145)
Total Bilirubin: 0.9 mg/dL (ref 0.3–1.2)
Total Protein: 5.5 g/dL — ABNORMAL LOW (ref 6.5–8.1)

## 2017-11-02 LAB — TROPONIN I
TROPONIN I: 0.07 ng/mL — AB (ref <0.03)
Troponin I: 0.06 ng/mL (ref <0.03)
Troponin I: 0.06 ng/mL (ref <0.03)

## 2017-11-02 NOTE — Consult Note (Signed)
Cardiology Consult    Patient ID: [name] [name]; [id number]; 1924/08/29   Admit date: 11/01/2017 Date of Consult: 11/02/2017  Primary Care Provider: Patient, No Pcp Per Primary Cardiologist: Dr. Wyline Mood   Patient Profile    [name] [name] is a 84 y.o. female with past medical history of CAD (diffuse nonobstructive CAD by cath in 2004), chronic combined systolic and diastolic CHF(EF 40% in 2004, normalized in the interim but down to 15-20% by echo in 06/2017), known LBBB, moderate MR, HTN, HLD, CVA (occurring in 2017) and Stage 3-4 CKD who is being seen today for the evaluation of CHF at the request of Dr. Sharl Ma.   History of Present Illness    [name] [name] was recently admitted from 1/20 - 10/18/2017 for worsening dyspnea on exertion and found to have a CHF exacerbation with BNP elevated to greater than 4500 at the time of admission and CXR consistent with CHF exacerbation. She diuresed well with IV Lasix and was transitioned to PO Lasix 20 mg daily at the time of discharge. Troponin values were found to be flat at 0.05 and conservative management was pursued in the setting of her advanced age and CKD.  At the time of her follow-up visit on 10/27/2017 she reported breathing had been stable and denied any associated chest pain or palpitations. She was unsure of which medication she was taking at home and could not confirm if she had continued on Lasix. Weight was stable at 103 lbs at that time (had been 102 lbs at the time of recent discharge).   She presented to [facility] ED on 10/29/2017 and reported having chest pain and shortness of breath.  Labs were obtained and she was found to have a troponin value of 0.06 and BNP at 3633.  She refused to stay for further treatment and left AMA.  She presented back to the ED on 11/01/2017 and was accompanied by her POA. She reported still having dyspnea on exertion at that time but denied any recurrent chest pain. Repeat labs showed WBC 5.6, Hgb 10.6,  platelets 150, K+ 4.4, and creatinine 1.59. BNP elevated to 3734. Initial troponin 0.07 with repeat values of 0.06 and 0.07. CXR showed small left effusion with associated infiltrate in the lateral left lung base. EKG showed normal sinus rhythm, heart rate 90 with PAC's and known left bundle branch block.  She was given a dose of IV Lasix 20mg  while in the ED and has been restarted on PTA PO Lasix 20mg  daily. Recorded net output is -247 cc but several incontinent episodes are noted. She reports breathing is at baseline currently and denies any chest pain.    Past Medical History:  Diagnosis Date  . Arthritis   . Coronary artery disease    Cath 2004--60% LAD--diffuse and calcified, 40% PDA  . Depression   . Hyperlipidemia   . Hypertension   . Nonischemic cardiomyopathy (HCC)    a. EF 30% 2004 b. 10/2006 EF=60-65% c. 06/2017: EF 15-20%, no regional WMA, Grade 1 DD, moderate MR  . Overactive bladder   . Pulmonary embolism (HCC)    status post  . Type II diabetes mellitus (HCC)     Past Surgical History:  Procedure Laterality Date  . APPENDECTOMY  1959  . BACK SURGERY  1967; 1977  . CATARACT EXTRACTION W/ INTRAOCULAR LENS  IMPLANT, BILATERAL  2008   right eye  . CHOLECYSTECTOMY  1959  . TONSILLECTOMY       Home Medications:  Prior to Admission medications   Medication Sig Start Date End Date Taking? Authorizing Provider  aspirin EC 81 MG EC tablet Take 1 tablet (81 mg total) by mouth daily. 10/19/17   Erick Blinks, MD  furosemide (LASIX) 20 MG tablet Take 1 tablet (20 mg total) by mouth daily. 10/27/17   Strader, Lennart Pall, PA-C  hydrocortisone (ANUSOL-HC) 2.5 % rectal cream Apply 1 application topically 2 (two) times daily as needed for hemorrhoids. 10/18/17   Erick Blinks, MD  hydrocortisone cream 1 % Apply 1 application topically 3 (three) times daily as needed for itching (minor skin irritation). 10/18/17   Erick Blinks, MD  metoprolol succinate (TOPROL-XL) 25 MG 24 hr  tablet Take 0.5 tablets (12.5 mg total) by mouth daily. 10/27/17   Strader, Lennart Pall, PA-C  nitroGLYCERIN (NITROSTAT) 0.4 MG SL tablet Place 0.4 mg under the tongue every 5 (five) minutes as needed.    [provider]    Inpatient Medications: Scheduled Meds: . aspirin EC  81 mg Oral Daily  . enoxaparin (LOVENOX) injection  30 mg Subcutaneous Q24H  . furosemide  20 mg Oral Daily  . metoprolol succinate  12.5 mg Oral Daily  . sodium chloride flush  3 mL Intravenous Q12H   Continuous Infusions: . sodium chloride     PRN Meds: sodium chloride, hydrocortisone, ondansetron **OR** ondansetron (ZOFRAN) IV, sodium chloride flush  Allergies:   No Known Allergies  Social History:   Social History   Socioeconomic History  . Marital status: Widowed    Spouse name: Not on file  . Number of children: 2  . Years of education: Not on file  . Highest education level: Not on file  Social Needs  . Financial resource strain: Not on file  . Food insecurity - worry: Not on file  . Food insecurity - inability: Not on file  . Transportation needs - medical: Not on file  . Transportation needs - non-medical: Not on file  Occupational History  . Occupation: retired  Tobacco Use  . Smoking status: Never Smoker  . Smokeless tobacco: Never Used  Substance and Sexual Activity  . Alcohol use: No  . Drug use: No  . Sexual activity: No  Other Topics Concern  . Not on file  Social History Narrative  . Not on file     Family History:    Family History  Problem Relation Age of Onset  . Stroke Mother        deceased      Review of Systems    General:  No chills, fever, night sweats or weight changes.  Cardiovascular:  No edema, orthopnea, palpitations, paroxysmal nocturnal dyspnea. Positive for chest pain and dyspnea on exertion.  Dermatological: No rash, lesions/masses Respiratory: No cough, dyspnea Urologic: No hematuria, dysuria Abdominal:   No nausea, vomiting, diarrhea,  bright red blood per rectum, melena, or hematemesis Neurologic:  No visual changes, wkns, changes in mental status. All other systems reviewed and are otherwise negative except as noted above.  Physical Exam/Data    Vitals:   11/01/17 2000 11/01/17 2130 11/01/17 2255 11/02/17 0554  BP: 106/68 102/68 113/64 111/64  Pulse: 95 92 89 82  Resp: 20 17 16 16   Temp:   97.9 F (36.6 C) 98 F (36.7 C)  TempSrc:   Oral Oral  SpO2: (!) 87% 100% 100% 99%  Weight:   102 lb 11.8 oz (46.6 kg)   Height:   5' 3.5" (1.613 m)     Intake/Output  Summary (Last 24 hours) at 11/02/2017 0839 Last data filed at 11/02/2017 0555 Gross per 24 hour  Intake 3 ml  Output 250 ml  Net -247 ml   Filed Weights   11/01/17 1615 11/01/17 2255  Weight: 103 lb (46.7 kg) 102 lb 11.8 oz (46.6 kg)   Body mass index is 17.91 kg/m.   General: Pleasant, elderly Caucasian female appearing in NAD Psych: Normal affect. Neuro: Alert and oriented X 3. Moves all extremities spontaneously. HEENT: Normal  Neck: Supple without bruits or JVD. Lungs:  Resp regular and unlabored, CTA. Heart: RRR no s3, s4, or murmurs. Abdomen: Soft, non-tender, non-distended, BS + x 4.  Extremities: No clubbing, cyanosis or edema. DP/PT/Radials 2+ and equal bilaterally.   EKG:  The EKG was personally reviewed and demonstrates: Normal sinus rhythm, HR 90 with PAC's and known left bundle branch block.   Telemetry:  Telemetry was personally reviewed and demonstrates:  NSR, HR in 80's to 90's with frequent PAC's.    Labs/Studies     Relevant CV Studies:  Echocardiogram: 10/17/2017 Study Conclusions  - Left ventricle: The cavity size was normal. Wall thickness was   normal. The estimated ejection fraction was in the range of 15%   to 20%. Features are consistent with a pseudonormal left   ventricular filling pattern, with concomitant abnormal relaxation   and increased filling pressure (grade 2 diastolic dysfunction).   Doppler  parameters are consistent with high ventricular filling   pressure. - Aortic valve: Mildly to moderately calcified annulus. Trileaflet;   mildly thickened leaflets. Valve area (VTI): 1.79 cm^2. Valve   area (Vmax): 1.59 cm^2. - Mitral valve: There was mild to moderate regurgitation. - Left atrium: The atrium was moderately dilated. - Right ventricle: The cavity size was mildly dilated. Systolic   function was mildly reduced. - Right atrium: The atrium was mildly dilated. - Atrial septum: No defect or patent foramen ovale was identified. - Tricuspid valve: There was moderate regurgitation.  Laboratory Data:  Chemistry Recent Labs  Lab 10/29/17 2050 11/01/17 1808 11/02/17 0503  NA 138 140 139  K 5.2* 4.4 3.8  CL 107 109 107  CO2 21* 21* 23  GLUCOSE 89 120* 88  BUN 38* 42* 41*  CREATININE 1.58* 1.59* 1.55*  CALCIUM 9.6 9.6 9.2  GFRNONAA 27* 27* 28*  GFRAA 32* 32* 33*  ANIONGAP 10 10 9     Recent Labs  Lab 11/02/17 0503  PROT 5.5*  ALBUMIN 3.1*  AST 26  ALT 21  ALKPHOS 82  BILITOT 0.9   Hematology Recent Labs  Lab 10/29/17 2050 11/01/17 1808 11/02/17 0503  WBC 5.2 5.6 5.0  RBC 3.73* 3.83* 3.66*  HGB 10.3* 10.6* 10.1*  HCT 33.1* 34.2* 32.4*  MCV 88.7 89.3 88.5  MCH 27.6 27.7 27.6  MCHC 31.1 31.0 31.2  RDW 13.3 13.7 13.6  PLT 141* 150 146*   Cardiac Enzymes Recent Labs  Lab 10/29/17 2050 11/01/17 1808 11/01/17 2258 11/02/17 0503  TROPONINI 0.06* 0.07* 0.06* 0.07*   No results for input(s): TROPIPOC in the last 168 hours.  BNP Recent Labs  Lab 10/29/17 2050 11/01/17 1808  BNP 3,633.0* 3,734.0*    DDimer No results for input(s): DDIMER in the last 168 hours.  Radiology/Studies:  Dg Chest 2 View  Result Date: 11/01/2017 CLINICAL DATA:  Chest pain.  Shortness of breath. EXAM: CHEST  2 VIEW COMPARISON:  October 29, 2017 FINDINGS: Small left effusion and associated infiltrate in the left base. No other interval  changes or acute abnormalities.  IMPRESSION: Small left effusion and associated infiltrate in the lateral left lung base. Recommend follow-up to resolution. Electronically Signed   By: Gerome Sam III M.D   On: 11/01/2017 18:54   Dg Chest 2 View  Result Date: 10/29/2017 CLINICAL DATA:  Per ED notes: Patient released from hospital on Wednesday , admit for CHF. Patient reports SOB and chest pain. No chest pain currently EXAM: CHEST  2 VIEW COMPARISON:  10/16/2017 FINDINGS: The heart is enlarged. There is mild interstitial pulmonary edema. Bilateral pleural effusions, left greater than right. There has been some improvement in the appearance of airspace filling opacities and consolidation in the left lower lobe. IMPRESSION: 1. Improved appearance of left lower lobe consolidation. 2. Cardiomegaly and mild edema persist. 3. Bilateral pleural effusions left greater than right. Electronically Signed   By: Norva Pavlov M.D.   On: 10/29/2017 20:56   Assessment & Plan    1. Acute on Chronic Combined Systolic and Diastolic CHF - the patient has a known reduced EF of 15-20% by recent echo and was recently admitted in 10/2017 for a CHF exacerbation. Was doing well at the time of her follow-up visit on 2/1 with weight stable at 103 lbs.  - she presented to the ED initially on 10/29/2017 and reported having chest pain and shortness of breath with BNP at 3633 but she refused to stay for further treatment and left AMA. Presented back several days later and BNP was elevated to 3734 and CXR showed small left effusion with associated infiltrate in the lateral left lung base.  - she was given a dose of IV Lasix while in the ED and has been restarted on PTA PO Lasix 20mg  daily. By examination, she does not appear significantly volume overloaded and weight is stable at 102 lbs. She is unsure about her medication regimen and cannot verify if she was taking Lasix at home. Would benefit from Home Health at the time of discharge to assist with medication  management in an effort to avoid repeat hospitalizations. If she is compliant with Lasix but continues to experience dyspnea, would further titrate her dosing to 40mg  daily. - continue Toprol-XL 12.5mg  daily. Not on ACE-I/ARB/ARNI due to her CKD.   2. CAD/ Elevated Troponin - the patient had diffuse nonobstructive CAD by cath in 2004 with likely progression of her CAD since with reduced EF of 15-20%.  - cyclic enzymes have been flat at 0.07 with repeat values of 0.06 and 0.07. EKG shows known LBBB.  - she denies any recurrent chest pain. Conservative management favored in the setting of her advanced age.  - continue ASA and BB therapy.   3. Mitral Regurgitation - moderate by recent echo. Continue to follow as an outpatient. No plans for invasive procedures.   4. Acute on Chronic Stage 3-4 CKD - baseline creatinine 1.4 - 1.5. Peaked at 2.00 during recent admission but improved to 1.59 on admission. Stable at 1.55 today.   For questions or updates, please contact [facility] Please consult [email or web address] for contact info under Cardiology/STEMI.  Signed, Ellsworth Lennox, PA-C 11/02/2017, 8:39 AM Pager: [phone]   Attending note:  Patient seen and examined.  Reviewed records and discussed the case with [name] [name].  She is admitted to the hospital with recent chest discomfort and shortness of breath.  Troponin I levels are minimally elevated and more consistent with CHF than ACS.  She does not appear significantly volume overloaded on examination, but does  have elevated BNP level and a small pleural effusion by chest x-ray.  It is also not clear that she has been taking her Lasix regularly at home.  On examination this morning she appears comfortable.  Heart rate is in the 80s and in sinus rhythm by telemetry with frequent PACs, personally reviewed.  Lungs exhibit exhibit decreased breath sounds at the base, no wheezing or crackles.  Cardiac exam reveals RRR with 2/6 systolic  murmur.  Lab work shows BUN 41, creatinine 1.55, potassium 3.8, peak troponin I 0.07, BNP 3734, hemoglobin 10.1.  ECG shows sinus rhythm with left bundle branch block, personally reviewed.  Patient admitted with shortness of breath and recent chest pain, no clear evidence of ACS.  She has chronic combined heart failure with LVEF 15-20%.  There is ongoing concern about medication noncompliance as discussed above.  Resume outpatient regimen, agree with switch to oral diuretic.  Consider home health to assist with medications and assessment.  Jonelle Sidle, M.D., F.A.C.C.

## 2017-11-02 NOTE — Care Management (Addendum)
Pt active with [facility]. [facility] rep aware of obs stay, does not need resumption orders. DC back home today with HH through [facility]. Cardiologiest has recommended HH for medication compliance. [facility] rep aware of cards concerns.

## 2017-11-02 NOTE — Care Management Obs Status (Signed)
MEDICARE OBSERVATION STATUS NOTIFICATION   Patient Details  Name: [name] [name] MRN: [id number] Date of Birth: 1924-09-22   Medicare Observation Status Notification Given:  Other (see comment)(discharged < 24hrs)    Malcolm Metro, RN 11/02/2017, 3:40 PM

## 2017-11-02 NOTE — Progress Notes (Signed)
CRITICAL VALUE ALERT  Critical Value:  Troponin 0.07  Date & Time Notied:  11/02/17, 0623  Provider Notified: [place]  Orders Received/Actions taken: No new orders

## 2017-11-02 NOTE — Discharge Summary (Signed)
Physician Discharge Summary  [name] [name] [facility] ZOX:[id number] DOB: [birth year] DOA: 11/01/2017  PCP: [name] [name], No Pcp Per  Admit date: 11/01/2017 Discharge date: 11/02/2017  Admitted From: Home Disposition: Home  Recommendations for Outpatient Follow-up:  1. Follow up with PCP in 1-2 weeks 2. Please obtain BMP/CBC in one week 3. Follow-up with cardiology on 2/18  Home Health: Home health RN Equipment/Devices:  Discharge Condition: Stable CODE STATUS: Full code Diet recommendation: Heart Healthy / Carb Modified    Brief/Interim Summary: 84 year old female with a history of chronic combined CHF with ejection fraction of 15-20%, chronic kidney disease stage III-IV, coronary artery disease, who presents to the hospital on 2/3 with complaints of shortness of breath.  At that time, she was noted to have mild volume overload and was recommended for admission.  She left hospital AGAINST MEDICAL ADVICE.  Labs were drawn during that visit she was noted to have a mildly elevated potassium of 5.2 as well as a BNP of 3633.  She is instructed to return to the emergency room for evaluation.  Upon arrival, [name] [name] denied any chest pain or shortness of breath.  Chest x-ray was repeated and showed a small pleural effusion.  There was some concern that she may still has some degree of volume overload and she was admitted for observation.  She received IV diuretics in the emergency room.  She still does not have any shortness of breath or chest pain.  She is able to ambulate with a walker.  Seen by cardiology who felt it was appropriate to continue her home regimen of oral diuretics.  There was concern that she may not be taking her diuretics as prescribed.  Home health RN has been arranged to assist with medication compliance.  If she continues to have evidence of volume overload despite taking current dose of Lasix daily, then her dose may be adjusted.  Follow-up with cardiology has been scheduled for 2/18.  [name] [name] is  otherwise stable for discharge.  Discharge Diagnoses:  Active Problems:   Essential hypertension   Coronary atherosclerosis   Type II diabetes mellitus (HCC)   Acute on chronic combined systolic and diastolic CHF (congestive heart failure) (HCC)   CHF exacerbation [facility])    Discharge Instructions  Discharge Instructions    Diet - low sodium heart healthy   Complete by:  As directed    Increase activity slowly   Complete by:  As directed      Allergies as of 11/02/2017   No Known Allergies     Medication List    TAKE these medications   aspirin 81 MG EC tablet Take 1 tablet (81 mg total) by mouth daily.   furosemide 20 MG tablet Commonly known as:  LASIX Take 1 tablet (20 mg total) by mouth daily.   hydrocortisone 2.5 % rectal cream Commonly known as:  ANUSOL-HC Apply 1 application topically 2 (two) times daily as needed for hemorrhoids.   hydrocortisone cream 1 % Apply 1 application topically 3 (three) times daily as needed for itching (minor skin irritation).   metoprolol succinate 25 MG 24 hr tablet Commonly known as:  TOPROL-XL Take 0.5 tablets (12.5 mg total) by mouth daily.   nitroGLYCERIN 0.4 MG SL tablet Commonly known as:  NITROSTAT Place 0.4 mg under the tongue every 5 (five) minutes as needed.       No Known Allergies  Consultations:  Cardiology   Procedures/Studies: Dg Chest 2 View  Result Date: 11/01/2017 CLINICAL DATA:  Chest pain.  Shortness of breath. EXAM: CHEST  2 VIEW COMPARISON:  October 29, 2017 FINDINGS: Small left effusion and associated infiltrate in the left base. No other interval changes or acute abnormalities. IMPRESSION: Small left effusion and associated infiltrate in the lateral left lung base. Recommend follow-up to resolution. Electronically Signed   By: Gerome Sam III M.D   On: 11/01/2017 18:54   Dg Chest 2 View  Result Date: 10/29/2017 CLINICAL DATA:  Per ED notes: [name] [name] released from hospital on Wednesday , admit  for CHF. [name] [name] reports SOB and chest pain. No chest pain currently EXAM: CHEST  2 VIEW COMPARISON:  10/16/2017 FINDINGS: The heart is enlarged. There is mild interstitial pulmonary edema. Bilateral pleural effusions, left greater than right. There has been some improvement in the appearance of airspace filling opacities and consolidation in the left lower lobe. IMPRESSION: 1. Improved appearance of left lower lobe consolidation. 2. Cardiomegaly and mild edema persist. 3. Bilateral pleural effusions left greater than right. Electronically Signed   By: Norva Pavlov M.D.   On: 10/29/2017 20:56   Dg Chest 2 View  Result Date: 10/15/2017 CLINICAL DATA:  Shortness of Breath EXAM: CHEST  2 VIEW COMPARISON:  September 26, 2017 FINDINGS: There is no appreciable edema or consolidation. There is atelectatic change in left base. There is cardiomegaly with pulmonary vascularity within normal limits. There is aortic atherosclerosis. Bones are osteoporotic. There is degenerative change in the thoracic spine and in each shoulder. IMPRESSION: Cardiomegaly. Left base atelectasis. No edema or consolidation. There is aortic atherosclerosis. Aortic Atherosclerosis (ICD10-I70.0). Electronically Signed   By: Bretta Bang III M.D.   On: 10/15/2017 10:18   US Renal  Result Date: 10/16/2017 CLINICAL DATA:  Acute renal injury EXAM: RENAL / URINARY TRACT ULTRASOUND COMPLETE COMPARISON:  CT from 05/09/2017 FINDINGS: Right Kidney: Length: 10.8 cm. No hydronephrosis is noted. A 3.6 cm cyst is noted similar to that seen on prior CT. No calculi are seen. Left Kidney: Atrophic in nature measuring 8 cm. This is similar to that seen on prior CT examination. No hydronephrosis is noted. Bladder: Appears normal for degree of bladder distention. IMPRESSION: Atrophic left kidney. Stable right renal cyst. No acute abnormality noted. Electronically Signed   By: Alcide Clever M.D.   On: 10/16/2017 16:49   Dg Chest Port 1 View  Result Date:  10/16/2017 CLINICAL DATA:  Acute exacerbation of congestive heart failure. Cardiomyopathy. Diabetes. Hypertension. History of pulmonary embolus. Nonsmoker. EXAM: PORTABLE CHEST 1 VIEW COMPARISON:  10/15/2017 FINDINGS: Cardiac enlargement with pulmonary vascular congestion. Interstitial infiltration likely edema. No pleural effusions. No pneumothorax. No focal consolidation. Calcification of the aorta. Degenerative changes in the shoulders. IMPRESSION: Cardiac enlargement with pulmonary vascular congestion and mild interstitial edema. Mild progression since previous study. Electronically Signed   By: Burman Nieves M.D.   On: 10/16/2017 06:17       Subjective: Denies any chest pain or shortness of breath.  Discharge Exam: Vitals:   11/02/17 0554 11/02/17 1114  BP: 111/64   Pulse: 82   Resp: 16   Temp: 98 F (36.7 C)   SpO2: 99% 100%   Vitals:   11/01/17 2130 11/01/17 2255 11/02/17 0554 11/02/17 1114  BP: 102/68 113/64 111/64   Pulse: 92 89 82   Resp: 17 16 16    Temp:  97.9 F (36.6 C) 98 F (36.7 C)   TempSrc:  Oral Oral   SpO2: 100% 100% 99% 100%  Weight:  46.6 kg (102 lb 11.8 oz)  Height:  5' 3.5" (1.613 m)      General: Pt is alert, awake, not in acute distress Cardiovascular: RRR, S1/S2 +, no rubs, no gallops Respiratory: diminished breath sounds at bases Abdominal: Soft, NT, ND, bowel sounds + Extremities: no edema, no cyanosis    The results of significant diagnostics from this hospitalization (including imaging, microbiology, ancillary and laboratory) are listed below for reference.     Microbiology: No results found for this or any previous visit (from the past 240 hour(s)).   Labs: BNP (last 3 results) Recent Labs    10/18/17 0438 10/29/17 2050 11/01/17 1808  BNP 2,811.0* 3,633.0* 3,734.0*   Basic Metabolic Panel: Recent Labs  Lab 10/27/17 1518 10/29/17 2050 11/01/17 1808 11/02/17 0503  NA 137 138 140 139  K 4.3 5.2* 4.4 3.8  CL 103 107 109  107  CO2 22 21* 21* 23  GLUCOSE 178* 89 120* 88  BUN 44* 38* 42* 41*  CREATININE 1.65* 1.58* 1.59* 1.55*  CALCIUM 9.7 9.6 9.6 9.2   Liver Function Tests: Recent Labs  Lab 11/02/17 0503  AST 26  ALT 21  ALKPHOS 82  BILITOT 0.9  PROT 5.5*  ALBUMIN 3.1*   No results for input(s): LIPASE, AMYLASE in the last 168 hours. No results for input(s): AMMONIA in the last 168 hours. CBC: Recent Labs  Lab 10/29/17 2050 11/01/17 1808 11/02/17 0503  WBC 5.2 5.6 5.0  NEUTROABS 3.1 3.7  --   HGB 10.3* 10.6* 10.1*  HCT 33.1* 34.2* 32.4*  MCV 88.7 89.3 88.5  PLT 141* 150 146*   Cardiac Enzymes: Recent Labs  Lab 10/29/17 2050 11/01/17 1808 11/01/17 2258 11/02/17 0503 11/02/17 1023  TROPONINI 0.06* 0.07* 0.06* 0.07* 0.06*   BNP: Invalid input(s): POCBNP CBG: No results for input(s): GLUCAP in the last 168 hours. D-Dimer No results for input(s): DDIMER in the last 72 hours. Hgb A1c No results for input(s): HGBA1C in the last 72 hours. Lipid Profile No results for input(s): CHOL, HDL, LDLCALC, TRIG, CHOLHDL, LDLDIRECT in the last 72 hours. Thyroid function studies No results for input(s): TSH, T4TOTAL, T3FREE, THYROIDAB in the last 72 hours.  Invalid input(s): FREET3 Anemia work up No results for input(s): VITAMINB12, FOLATE, FERRITIN, TIBC, IRON, RETICCTPCT in the last 72 hours. Urinalysis    Component Value Date/Time   COLORURINE YELLOW 10/15/2017 0940   APPEARANCEUR CLEAR 10/15/2017 0940   LABSPEC 1.016 10/15/2017 0940   PHURINE 5.0 10/15/2017 0940   GLUCOSEU NEGATIVE 10/15/2017 0940   HGBUR SMALL (A) 10/15/2017 0940   BILIRUBINUR NEGATIVE 10/15/2017 0940   KETONESUR NEGATIVE 10/15/2017 0940   PROTEINUR NEGATIVE 10/15/2017 0940   UROBILINOGEN 0.2 12/30/2007 1850   NITRITE NEGATIVE 10/15/2017 0940   LEUKOCYTESUR NEGATIVE 10/15/2017 0940   Sepsis Labs Invalid input(s): PROCALCITONIN,  WBC,  LACTICIDVEN Microbiology No results found for this or any previous visit  (from the past 240 hour(s)).   Time coordinating discharge: Over 30 minutes  SIGNED:   Erick Blinks, MD  [facility] 11/02/2017, 12:54 PM Pager   If 7PM-7AM, please contact night-coverage [email or web address] Password [email or web address]

## 2017-11-02 NOTE — Progress Notes (Signed)
Pt's troponin 0.06, MD made aware. Will continue to monitor.

## 2017-11-02 NOTE — Progress Notes (Signed)
Herbert Seta, RN notified of critical lab: Troponin 0.06.

## 2017-11-02 NOTE — Progress Notes (Signed)
Patient discharged home with personal belongings, IV removed and site intact.  

## 2017-11-12 NOTE — Progress Notes (Signed)
Cardiology Office Note    Date:  11/13/2017   ID:  BRANDE UNCAPHER, DOB 1924/04/04, MRN [id number]  PCP:  [name] [name], No Pcp Per  Cardiologist: Dina Rich, MD    Chief Complaint  [name] [name] presents with  . Hospitalization Follow-up    History of Present Illness:    [name] [name] is a 84 y.o. female with past medical history of CAD (diffuse nonobstructive CAD by cath in 2004), chronic combined systolic and diastolic CHF(EF 40% in 2004, normalized in the interim but down to 15-20% by echo in 06/2017), known LBBB, moderate MR, HTN, HLD, CVA (occurring in 2017) and Stage 3-4 CKD who presents for hospital follow-up.  Was last examined by myself on 10/27/2017 following a recent hospitalization for a CHF exacerbation.  Was unsure of the exact medications she was taking at that time but weight was stable at 103 lbs, therefore she was continued on Lasix 20 mg daily and Toprol-XL.  She presented back to [facility] ED on 10/29/2017 for worsening dyspnea on exertion and was found to have an elevated troponin of 0.06 and BNP at 3633 but she left AMA prior to evaluation. She presented back to the ED on 11/01/2017 and was admitted for further diuresis. Cyclic troponin values were again obtained and remained flat at 0.06 and 0.07. She received a dose of IV Lasix was then resumed on her prior to admission PO regimen of Lasix 20 mg daily. Weight was stable at 102 lbs and creatinine was close to baseline at 1.55.  In talking with the [name] [name] today, she reports her main concern since hospital discharge has continued to be itching. She has followed up with her primary care provider regards to this and was prescribed a different medication but says it only helped for the first dose. She continues to have dyspnea on exertion but denies any recent orthopnea, PND, lower extremity edema, chest pain, or palpitations.  Reports she has been following her weight at home and weight was 103 lbs at the time of discharge but  has increased to 105 lbs over the past few days. Reports good compliance with her Lasix dosing.   Past Medical History:  Diagnosis Date  . Arthritis   . Coronary artery disease    Cath 2004--60% LAD--diffuse and calcified, 40% PDA  . Depression   . Hyperlipidemia   . Hypertension   . Nonischemic cardiomyopathy (HCC)    a. EF 30% 2004 b. 10/2006 EF=60-65% c. 06/2017: EF 15-20%, no regional WMA, Grade 1 DD, moderate MR  . Overactive bladder   . Pulmonary embolism (HCC)    status post  . Type II diabetes mellitus (HCC)     Past Surgical History:  Procedure Laterality Date  . APPENDECTOMY  1959  . BACK SURGERY  1967; 1977  . CATARACT EXTRACTION W/ INTRAOCULAR LENS  IMPLANT, BILATERAL  2008   right eye  . CHOLECYSTECTOMY  1959  . TONSILLECTOMY      Current Medications: Outpatient Medications Prior to Visit  Medication Sig Dispense Refill  . aspirin EC 81 MG EC tablet Take 1 tablet (81 mg total) by mouth daily. 30 tablet 0  . hydrocortisone (ANUSOL-HC) 2.5 % rectal cream Apply 1 application topically 2 (two) times daily as needed for hemorrhoids. 30 g 0  . hydrocortisone cream 1 % Apply 1 application topically 3 (three) times daily as needed for itching (minor skin irritation). 30 g 0  . metoprolol succinate (TOPROL-XL) 25 MG 24 hr tablet Take 0.5  tablets (12.5 mg total) by mouth daily. 45 tablet 3  . furosemide (LASIX) 20 MG tablet Take 1 tablet (20 mg total) by mouth daily. 90 tablet 3   No facility-administered medications prior to visit.      Allergies:   [name] [name] has no known allergies.   Social History   Socioeconomic History  . Marital status: Widowed    Spouse name: None  . Number of children: 2  . Years of education: None  . Highest education level: None  Social Needs  . Financial resource strain: None  . Food insecurity - worry: None  . Food insecurity - inability: None  . Transportation needs - medical: None  . Transportation needs - non-medical: None    Occupational History  . Occupation: retired  Tobacco Use  . Smoking status: Never Smoker  . Smokeless tobacco: Never Used  Substance and Sexual Activity  . Alcohol use: No  . Drug use: No  . Sexual activity: No  Other Topics Concern  . None  Social History Narrative  . None     Family History:  The [name] [name]'s family history includes Stroke in her mother.   Review of Systems:   Please see the history of present illness.     General:  No chills, fever, night sweats or weight changes. Positive for itching.  Cardiovascular:  No chest pain, edema, orthopnea, palpitations, paroxysmal nocturnal dyspnea. Positive for dyspnea on exertion.  Dermatological: No rash, lesions/masses Respiratory: No cough, dyspnea Urologic: No hematuria, dysuria Abdominal:   No nausea, vomiting, diarrhea, bright red blood per rectum, melena, or hematemesis Neurologic:  No visual changes, wkns, changes in mental status. All other systems reviewed and are otherwise negative except as noted above.   Physical Exam:    VS:  BP 110/60   Pulse 70   Ht 5\' 3"  (1.6 m)   Wt 107 lb (48.5 kg)   SpO2 90%   BMI 18.95 kg/m    General: Well developed, well nourished elderly Caucasian female appearing in no acute distress. Head: Normocephalic, atraumatic, sclera non-icteric, no xanthomas, nares are without discharge.  Neck: No carotid bruits. JVD not elevated.  Lungs: Respirations regular and unlabored, without wheezes or rales.  Heart: Regular rate and rhythm. No S3 or S4.  No rubs or gallops appreciated. 2/6 SEM along Apex.  Abdomen: Soft, non-tender, non-distended with normoactive bowel sounds. No hepatomegaly. No rebound/guarding. No obvious abdominal masses. Msk:  Strength and tone appear normal for age. No joint deformities or effusions. Extremities: No clubbing or cyanosis. 1+ pitting edema along LLE, trace edema along RLE.  Distal pedal pulses are 2+ bilaterally. Neuro: Alert and oriented X 3. Moves all  extremities spontaneously. No focal deficits noted. Psych:  Responds to questions appropriately with a normal affect. Skin: No rashes or lesions noted  Wt Readings from Last 3 Encounters:  11/13/17 107 lb (48.5 kg)  11/01/17 102 lb 11.8 oz (46.6 kg)  10/29/17 103 lb 4.8 oz (46.9 kg)     Studies/Labs Reviewed:   EKG:  EKG is not ordered today.    Recent Labs: 10/15/2017: TSH 1.589 10/18/2017: Magnesium 2.0 11/01/2017: B Natriuretic Peptide 3,734.0 11/02/2017: ALT 21; BUN 41; Creatinine, Ser 1.55; Hemoglobin 10.1; Platelets 146; Potassium 3.8; Sodium 139   Lipid Panel    Component Value Date/Time   CHOL 141 07/21/2017 0457   TRIG 112 07/21/2017 0457   HDL 43 07/21/2017 0457   CHOLHDL 3.3 07/21/2017 0457   VLDL 22 07/21/2017 0457  LDLCALC 76 07/21/2017 0457    Additional studies/ records that were reviewed today include:   Echocardiogram: 10/17/2017 Study Conclusions  - Left ventricle: The cavity size was normal. Wall thickness was   normal. The estimated ejection fraction was in the range of 15%   to 20%. Features are consistent with a pseudonormal left   ventricular filling pattern, with concomitant abnormal relaxation   and increased filling pressure (grade 2 diastolic dysfunction).   Doppler parameters are consistent with high ventricular filling   pressure. - Aortic valve: Mildly to moderately calcified annulus. Trileaflet;   mildly thickened leaflets. Valve area (VTI): 1.79 cm^2. Valve   area (Vmax): 1.59 cm^2. - Mitral valve: There was mild to moderate regurgitation. - Left atrium: The atrium was moderately dilated. - Right ventricle: The cavity size was mildly dilated. Systolic   function was mildly reduced. - Right atrium: The atrium was mildly dilated. - Atrial septum: No defect or patent foramen ovale was identified. - Tricuspid valve: There was moderate regurgitation.  Assessment:    1. Chronic combined systolic (congestive) and diastolic (congestive) heart  failure (HCC)   2. Atherosclerosis of native coronary artery of native heart without angina pectoris   3. Essential hypertension   4. Non-rheumatic mitral regurgitation   5. Stage 3 chronic kidney disease (HCC)   6. Medication management      Plan:   In order of problems listed above:  1. Chronic Combined Systolic and Diastolic CHF - the [name] [name] has a known reduced EF of 15-20% by echo in 06/2017. Has been hospitalized twice for a CHF exacerbation within the past two months.Weight was stable at 102 lbs at the time of most recent discharge and is elevated at 107 lbs today.  - she continues to have baseline dyspnea on exertion but denies any recent orthopnea, PND, or chest pain. She does have 1+ pitting edema on examination. In the setting of her worsening dyspnea, edema, and weight gain, will further titrate Lasix to 40mg  daily. Plan for a repeat BMET in 2 weeks. - continue BB therapy. Unable to add ACE-I/ARB/ARNI secondary to soft BP.   2. CAD - the [name] [name] has known diffuse nonobstructive CAD by cath in 2004. This has likely progressed since then in the setting of her decreased EF. No plans for further ischemic evaluation at this time in the setting of her advanced age and CKD and she declines any further evaluation as well. - No recent chest pain.  Has baseline dyspnea on exertion. - Continue ASA and beta-blocker therapy.  Intolerant to statins previously secondary to myalgias.  3. HTN -BP is well controlled at 110/60 during today's visit. -Continue Toprol-XL 12.5mg  daily.   4. Mitral Regurgitation - mild to moderate by most recent echo.  5. Stage 3 CKD - baseline creatinine 1.4 - 1.5. Stable at 1.55 on 11/02/2017. Will recheck in 2 weeks following dose adjustment of Lasix.    Medication Adjustments/Labs and Tests Ordered: Current medicines are reviewed at length with the [name] [name] today.  Concerns regarding medicines are outlined above.  Medication changes, Labs and Tests ordered  today are listed in the [name] [name] Instructions below. [name] [name] Instructions  Your physician recommends that you schedule a follow-up appointment in: keep apt with Dr Wyline Mood on 01/26/18 at 11:40 am at the [facility] office   INCREASE Lasix to 40 mg daily  Get lab work : BMET in 2 weeks  No tests ordered today.   Thank you for choosing [facility] !  Signed, Ellsworth Lennox, PA-C  11/13/2017 7:23 PM    [facility] [address] [place], [place] [address] Phone: [phone]

## 2017-11-13 ENCOUNTER — Encounter: Payer: Self-pay | Admitting: Student

## 2017-11-13 ENCOUNTER — Ambulatory Visit (INDEPENDENT_AMBULATORY_CARE_PROVIDER_SITE_OTHER): Payer: Medicare Other | Admitting: Student

## 2017-11-13 VITALS — BP 110/60 | HR 70 | Ht 63.0 in | Wt 107.0 lb

## 2017-11-13 DIAGNOSIS — I5042 Chronic combined systolic (congestive) and diastolic (congestive) heart failure: Secondary | ICD-10-CM | POA: Diagnosis not present

## 2017-11-13 DIAGNOSIS — I10 Essential (primary) hypertension: Secondary | ICD-10-CM

## 2017-11-13 DIAGNOSIS — N183 Chronic kidney disease, stage 3 unspecified: Secondary | ICD-10-CM

## 2017-11-13 DIAGNOSIS — I2510 Atherosclerotic heart disease of native coronary artery without angina pectoris: Secondary | ICD-10-CM | POA: Diagnosis not present

## 2017-11-13 DIAGNOSIS — Z79899 Other long term (current) drug therapy: Secondary | ICD-10-CM | POA: Diagnosis not present

## 2017-11-13 DIAGNOSIS — I340 Nonrheumatic mitral (valve) insufficiency: Secondary | ICD-10-CM | POA: Diagnosis not present

## 2017-11-13 MED ORDER — FUROSEMIDE 40 MG PO TABS: 40.0000 mg | ORAL_TABLET | Freq: Every day | ORAL | 3 refills | Status: DC

## 2017-11-13 NOTE — Patient Instructions (Addendum)
Your physician recommends that you schedule a follow-up appointment in: keep apt with Dr Wyline Mood on 01/26/18 at 11:40 am at the [facility] office   INCREASE Lasix to 40 mg daily   Get lab work : BMET in 2 weeks     No tests ordered today.     Thank you for choosing [facility] !

## 2017-11-16 ENCOUNTER — Emergency Department (HOSPITAL_COMMUNITY)
Admission: EM | Admit: 2017-11-16 | Discharge: 2017-11-16 | Disposition: A | Discharge status: Home / Self Care | Payer: Medicare Other | Attending: Emergency Medicine | Admitting: Emergency Medicine

## 2017-11-16 ENCOUNTER — Encounter (HOSPITAL_COMMUNITY): Payer: Self-pay | Admitting: Emergency Medicine

## 2017-11-16 DIAGNOSIS — Z79899 Other long term (current) drug therapy: Secondary | ICD-10-CM | POA: Diagnosis not present

## 2017-11-16 DIAGNOSIS — I110 Hypertensive heart disease with heart failure: Secondary | ICD-10-CM | POA: Diagnosis not present

## 2017-11-16 DIAGNOSIS — E119 Type 2 diabetes mellitus without complications: Secondary | ICD-10-CM | POA: Diagnosis not present

## 2017-11-16 DIAGNOSIS — Z7982 Long term (current) use of aspirin: Secondary | ICD-10-CM | POA: Diagnosis not present

## 2017-11-16 DIAGNOSIS — L299 Pruritus, unspecified: Secondary | ICD-10-CM | POA: Insufficient documentation

## 2017-11-16 DIAGNOSIS — N189 Chronic kidney disease, unspecified: Secondary | ICD-10-CM

## 2017-11-16 DIAGNOSIS — I2510 Atherosclerotic heart disease of native coronary artery without angina pectoris: Secondary | ICD-10-CM | POA: Insufficient documentation

## 2017-11-16 DIAGNOSIS — N179 Acute kidney failure, unspecified: Secondary | ICD-10-CM | POA: Diagnosis not present

## 2017-11-16 DIAGNOSIS — I5043 Acute on chronic combined systolic (congestive) and diastolic (congestive) heart failure: Secondary | ICD-10-CM | POA: Insufficient documentation

## 2017-11-16 LAB — CBC WITH DIFFERENTIAL/PLATELET
Basophils Absolute: 0 10*3/uL (ref 0.0–0.1)
Basophils Relative: 1 %
Eosinophils Absolute: 0 10*3/uL (ref 0.0–0.7)
Eosinophils Relative: 1 %
HEMATOCRIT: 35.5 % — AB (ref 36.0–46.0)
HEMOGLOBIN: 10.9 g/dL — AB (ref 12.0–15.0)
LYMPHS ABS: 0.8 10*3/uL (ref 0.7–4.0)
LYMPHS PCT: 15 %
MCH: 27.2 pg (ref 26.0–34.0)
MCHC: 30.7 g/dL (ref 30.0–36.0)
MCV: 88.5 fL (ref 78.0–100.0)
MONOS PCT: 7 %
Monocytes Absolute: 0.4 10*3/uL (ref 0.1–1.0)
NEUTROS ABS: 3.9 10*3/uL (ref 1.7–7.7)
NEUTROS PCT: 76 %
Platelets: 159 10*3/uL (ref 150–400)
RBC: 4.01 MIL/uL (ref 3.87–5.11)
RDW: 14 % (ref 11.5–15.5)
WBC: 5.1 10*3/uL (ref 4.0–10.5)

## 2017-11-16 LAB — BASIC METABOLIC PANEL
ANION GAP: 11 (ref 5–15)
BUN: 39 mg/dL — ABNORMAL HIGH (ref 6–20)
CHLORIDE: 106 mmol/L (ref 101–111)
CO2: 20 mmol/L — AB (ref 22–32)
Calcium: 9.5 mg/dL (ref 8.9–10.3)
Creatinine, Ser: 1.78 mg/dL — ABNORMAL HIGH (ref 0.44–1.00)
GFR calc non Af Amer: 24 mL/min — ABNORMAL LOW (ref >60)
GFR, EST AFRICAN AMERICAN: 28 mL/min — AB (ref >60)
Glucose, Bld: 146 mg/dL — ABNORMAL HIGH (ref 65–99)
Potassium: 4.2 mmol/L (ref 3.5–5.1)
Sodium: 137 mmol/L (ref 135–145)

## 2017-11-16 LAB — URINALYSIS, ROUTINE W REFLEX MICROSCOPIC
BACTERIA UA: NONE SEEN
BILIRUBIN URINE: NEGATIVE
Glucose, UA: NEGATIVE mg/dL
Ketones, ur: NEGATIVE mg/dL
Nitrite: NEGATIVE
PROTEIN: NEGATIVE mg/dL
Specific Gravity, Urine: 1.017 (ref 1.005–1.030)
pH: 5 (ref 5.0–8.0)

## 2017-11-16 MED ORDER — HYDROXYZINE PAMOATE 25 MG PO CAPS: ORAL_CAPSULE | ORAL | 0 refills | Status: DC

## 2017-11-16 MED ORDER — HYDROXYZINE HCL 25 MG PO TABS
25.0000 mg | ORAL_TABLET | Freq: Once | ORAL | Status: AC
  Administered 2017-11-16: 25 mg via ORAL
  Filled 2017-11-16: qty 1

## 2017-11-16 NOTE — ED Provider Notes (Signed)
[facility] EMERGENCY DEPARTMENT Provider Note   CSN: [id number] Arrival date & time: 11/16/17  1228     History   Chief Complaint Chief Complaint  Patient presents with  . Pruritis    HPI [name] [name] is a 84 y.o. female.  Patient is a 84 year old female who presents to the emergency department with itching all over.  Patient states that she has had this problem for most of this month.  She states that she has been treated with various medications.  Some of them she has not taken because she did not feel like that they would do any good.  She says now the itching is interrupting with her comfort during the day, and her rest at night.  She has not seen any insects, she does not feel that she is in an environment where she may have bedbugs.  She has not had any new lotions, soaps, dryer sheets, or related problems.  She is not had new clothing, and has not been in new environments.  She denies any new shortness of breath, she states that at times she is short of breath but that this comes and goes from time to time.  No recent high fever to be reported.  She presents now for assistance with this issue.      Past Medical History:  Diagnosis Date  . Arthritis   . Coronary artery disease    Cath 2004--60% LAD--diffuse and calcified, 40% PDA  . Depression   . Hyperlipidemia   . Hypertension   . Nonischemic cardiomyopathy (HCC)    a. EF 30% 2004 b. 10/2006 EF=60-65% c. 06/2017: EF 15-20%, no regional WMA, Grade 1 DD, moderate MR  . Overactive bladder   . Pulmonary embolism (HCC)    status post  . Type II diabetes mellitus [facility])     Patient Active Problem List   Diagnosis Date Noted  . CHF exacerbation (HCC) 11/01/2017  . Acute on chronic combined systolic and diastolic CHF (congestive heart failure) (HCC) 10/15/2017  . Nonischemic cardiomyopathy (HCC) 10/15/2017  . Coronary artery disease 10/15/2017  . Chest pain 07/20/2017  . Type II diabetes mellitus (HCC)   . PE  10/01/2009  . Difficulty walking 10/01/2009  . Dysthymic disorder 07/06/2009  . UNSPECIFIED CHRONIC ISCHEMIC HEART DISEASE 07/06/2009  . Left bundle branch block (LBBB) 07/06/2009  . Coronary atherosclerosis 12/12/2007  . BACK PAIN, THORACIC REGION, CHRONIC 12/12/2007  . Hyperlipidemia 09/11/2007  . CATARACTS 09/11/2007  . Essential hypertension 09/11/2007  . OVERACTIVE BLADDER 09/11/2007  . Arthropathy 09/11/2007    Past Surgical History:  Procedure Laterality Date  . APPENDECTOMY  1959  . BACK SURGERY  1967; 1977  . CATARACT EXTRACTION W/ INTRAOCULAR LENS  IMPLANT, BILATERAL  2008   right eye  . CHOLECYSTECTOMY  1959  . TONSILLECTOMY      OB History    Gravida Para Term Preterm AB Living   2 2 2          SAB TAB Ectopic Multiple Live Births                   Home Medications    Prior to Admission medications   Medication Sig Start Date End Date Taking? Authorizing Provider  aspirin EC 81 MG EC tablet Take 1 tablet (81 mg total) by mouth daily. 10/19/17   Erick Blinks, MD  furosemide (LASIX) 40 MG tablet Take 1 tablet (40 mg total) by mouth daily. 11/13/17 02/11/18  Ellsworth Lennox,  PA-C  hydrocortisone (ANUSOL-HC) 2.5 % rectal cream Apply 1 application topically 2 (two) times daily as needed for hemorrhoids. 10/18/17   Erick Blinks, MD  hydrocortisone cream 1 % Apply 1 application topically 3 (three) times daily as needed for itching (minor skin irritation). 10/18/17   Erick Blinks, MD  metoprolol succinate (TOPROL-XL) 25 MG 24 hr tablet Take 0.5 tablets (12.5 mg total) by mouth daily. 10/27/17   Ellsworth Lennox, PA-C    Family History Family History  Problem Relation Age of Onset  . Stroke Mother        deceased    Social History Social History   Tobacco Use  . Smoking status: Never Smoker  . Smokeless tobacco: Never Used  Substance Use Topics  . Alcohol use: No  . Drug use: No     Allergies   Patient has no known allergies.   Review of  Systems Review of Systems  Constitutional: Positive for fatigue. Negative for activity change.       All ROS Neg except as noted in HPI  HENT: Negative for nosebleeds.   Eyes: Negative for photophobia and discharge.  Respiratory: Negative for cough and wheezing.   Cardiovascular: Negative for chest pain and palpitations.  Gastrointestinal: Negative for abdominal pain and blood in stool.  Genitourinary: Negative for dysuria, frequency and hematuria.  Musculoskeletal: Negative for arthralgias, back pain and neck pain.  Skin: Negative for rash.       itching  Neurological: Negative for dizziness, seizures and speech difficulty.  Psychiatric/Behavioral: Negative for confusion and hallucinations.     Physical Exam Updated Vital Signs BP (!) 123/110 (BP Location: Right Arm)   Pulse 99   Temp 97.7 F (36.5 C) (Oral)   Resp 18   Ht 5\' 3"  (1.6 m)   Wt 48.5 kg (107 lb)   SpO2 99%   BMI 18.95 kg/m   Physical Exam  Constitutional: She is oriented to person, place, and time. She appears well-developed and well-nourished.  Non-toxic appearance.  HENT:  Head: Normocephalic.  Right Ear: Tympanic membrane and external ear normal.  Left Ear: Tympanic membrane and external ear normal.  Eyes: EOM and lids are normal. Pupils are equal, round, and reactive to light. No scleral icterus.  No scleral icterus noted.  Neck: Normal range of motion. Neck supple. Carotid bruit is not present.  Cardiovascular: Normal rate, regular rhythm, normal heart sounds, intact distal pulses and normal pulses.  Pulmonary/Chest: Breath sounds normal. No respiratory distress.  Respiratory rate 24 by my count.  Abdominal: Soft. Bowel sounds are normal. There is no tenderness. There is no guarding.  Musculoskeletal: Normal range of motion.  Lymphadenopathy:       Head (right side): No submandibular adenopathy present.       Head (left side): No submandibular adenopathy present.    She has no cervical adenopathy.    Neurological: She is alert and oriented to person, place, and time. She has normal strength. No cranial nerve deficit or sensory deficit.  Skin: Skin is warm and dry.  Some generalized dry skin patches noted. No evidence of insect bites.  Psychiatric: Her speech is normal.  Nursing note and vitals reviewed.    ED Treatments / Results  Labs (all labs ordered are listed, but only abnormal results are displayed) Labs Reviewed  CBC WITH DIFFERENTIAL/PLATELET - Abnormal; Notable for the following components:      Result Value   Hemoglobin 10.9 (*)    HCT 35.5 (*)  All other components within normal limits  BASIC METABOLIC PANEL  URINALYSIS, ROUTINE W REFLEX MICROSCOPIC    EKG  EKG Interpretation None       Radiology No results found.  Procedures Procedures (including critical care time)  Medications Ordered in ED Medications - No data to display   Initial Impression / Assessment and Plan / ED Course  I have reviewed the triage vital signs and the nursing notes.  Pertinent labs & imaging results that were available during my care of the patient were reviewed by me and considered in my medical decision making (see chart for details).       Final Clinical Impressions(s) / ED Diagnoses  Blood pressure is elevated at 123/110.  Patient has some elevation in respiratory rate of 24, however she has a history of some congestive heart failure. I reviewed previous emergency department visits, and the patient has some renal abnormalities.  We will recheck the renal status, as well as her urine and complete blood count.  Patient states that she is itching more, patient given Vistaril here in the emergency department.  Spoke with the medical power of attorney.  I have updated him on the findings up to this point.  The complete blood count is well within normal limits.  The urine analysis is negative for urinary tract infection the BUN is slightly improved from the test on  February 6, the creatinine has gone up higher than the test on February 6.  The plan at this time will be for the patient to use Vistaril at bedtime.  The patient is to see the primary physician to discuss this further.  I suspect that the patient has renal vascular disease complicated by low ejection fraction and elevating renal function studies, and that this is contributing to the itching.  Of asked the patient to return to the emergency department immediately if any changes in condition, problems, or concerns.  I have also asked the patient to use extreme caution when taking the Vistaril at bedtime as it can cause drowsiness.   Final diagnoses:  Pruritus  Chronic renal failure, unspecified CKD stage    ED Discharge Orders    None       Ivery Quale, PA-C 11/16/17 1558    Loren Racer, MD 11/21/17 [phone]

## 2017-11-16 NOTE — ED Triage Notes (Signed)
Patient complaining of itching "for a long time." States she was here two weeks ago for same.

## 2017-11-16 NOTE — Discharge Instructions (Signed)
Your itching is probably related to changes in your kidney function, as your kidney function is elevated compared to earlier this month.  Please increase fluids.  Please use Vistaril at bedtime for itching.  Use caution getting up at night when taking medications use your walker.  Please see your primary physician to discuss a plan of therapy going forward.

## 2017-11-18 ENCOUNTER — Emergency Department (HOSPITAL_COMMUNITY)
Admission: EM | Admit: 2017-11-18 | Discharge: 2017-11-18 | Disposition: A | Discharge status: Home / Self Care | Payer: Medicare Other | Attending: Emergency Medicine | Admitting: Emergency Medicine

## 2017-11-18 ENCOUNTER — Encounter (HOSPITAL_COMMUNITY): Payer: Self-pay | Admitting: *Deleted

## 2017-11-18 DIAGNOSIS — Z7982 Long term (current) use of aspirin: Secondary | ICD-10-CM | POA: Insufficient documentation

## 2017-11-18 DIAGNOSIS — B86 Scabies: Secondary | ICD-10-CM | POA: Diagnosis not present

## 2017-11-18 DIAGNOSIS — I110 Hypertensive heart disease with heart failure: Secondary | ICD-10-CM | POA: Diagnosis not present

## 2017-11-18 DIAGNOSIS — I5043 Acute on chronic combined systolic (congestive) and diastolic (congestive) heart failure: Secondary | ICD-10-CM | POA: Insufficient documentation

## 2017-11-18 DIAGNOSIS — E119 Type 2 diabetes mellitus without complications: Secondary | ICD-10-CM | POA: Diagnosis not present

## 2017-11-18 DIAGNOSIS — I2510 Atherosclerotic heart disease of native coronary artery without angina pectoris: Secondary | ICD-10-CM | POA: Diagnosis not present

## 2017-11-18 DIAGNOSIS — L299 Pruritus, unspecified: Secondary | ICD-10-CM | POA: Diagnosis present

## 2017-11-18 MED ORDER — PERMETHRIN 5 % EX CREA: TOPICAL_CREAM | CUTANEOUS | 1 refills | Status: DC

## 2017-11-18 NOTE — ED Triage Notes (Signed)
Pt with continued itching, seen on Wednesday.  Denies rash.

## 2017-11-18 NOTE — ED Provider Notes (Signed)
[facility] EMERGENCY DEPARTMENT Provider Note   CSN: [id number] Arrival date & time: 11/18/17  1152     History   Chief Complaint Chief Complaint  Patient presents with  . Itching    HPI [name] [name] is a 84 y.o. female.  Persistent itching for questionable 2 weeks.  Patient seen in the ED on 10/17/17 with similar concerns.  She continues to complain of generalized pruritus.  Now symptoms are in the web spaces of her fingers.  No fever, sweats, chills, pain, dyspnea.  Severity is moderate.      Past Medical History:  Diagnosis Date  . Arthritis   . Coronary artery disease    Cath 2004--60% LAD--diffuse and calcified, 40% PDA  . Depression   . Hyperlipidemia   . Hypertension   . Nonischemic cardiomyopathy (HCC)    a. EF 30% 2004 b. 10/2006 EF=60-65% c. 06/2017: EF 15-20%, no regional WMA, Grade 1 DD, moderate MR  . Overactive bladder   . Pulmonary embolism (HCC)    status post  . Type II diabetes mellitus [facility])     Patient Active Problem List   Diagnosis Date Noted  . CHF exacerbation (HCC) 11/01/2017  . Acute on chronic combined systolic and diastolic CHF (congestive heart failure) (HCC) 10/15/2017  . Nonischemic cardiomyopathy (HCC) 10/15/2017  . Coronary artery disease 10/15/2017  . Chest pain 07/20/2017  . Type II diabetes mellitus (HCC)   . PE 10/01/2009  . Difficulty walking 10/01/2009  . Dysthymic disorder 07/06/2009  . UNSPECIFIED CHRONIC ISCHEMIC HEART DISEASE 07/06/2009  . Left bundle branch block (LBBB) 07/06/2009  . Coronary atherosclerosis 12/12/2007  . BACK PAIN, THORACIC REGION, CHRONIC 12/12/2007  . Hyperlipidemia 09/11/2007  . CATARACTS 09/11/2007  . Essential hypertension 09/11/2007  . OVERACTIVE BLADDER 09/11/2007  . Arthropathy 09/11/2007    Past Surgical History:  Procedure Laterality Date  . APPENDECTOMY  1959  . BACK SURGERY  1967; 1977  . CATARACT EXTRACTION W/ INTRAOCULAR LENS  IMPLANT, BILATERAL  2008   right eye  .  CHOLECYSTECTOMY  1959  . TONSILLECTOMY      OB History    Gravida Para Term Preterm AB Living   2 2 2          SAB TAB Ectopic Multiple Live Births                   Home Medications    Prior to Admission medications   Medication Sig Start Date End Date Taking? Authorizing Provider  aspirin EC 81 MG EC tablet Take 1 tablet (81 mg total) by mouth daily. 10/19/17   Erick Blinks, MD  furosemide (LASIX) 40 MG tablet Take 1 tablet (40 mg total) by mouth daily. 11/13/17 02/11/18  Strader, Lennart Pall, PA-C  hydrocortisone (ANUSOL-HC) 2.5 % rectal cream Apply 1 application topically 2 (two) times daily as needed for hemorrhoids. 10/18/17   Erick Blinks, MD  hydrocortisone cream 1 % Apply 1 application topically 3 (three) times daily as needed for itching (minor skin irritation). 10/18/17   Erick Blinks, MD  hydrOXYzine (VISTARIL) 25 MG capsule 1 at hs for itching. 11/16/17   Ivery Quale, PA-C  metoprolol succinate (TOPROL-XL) 25 MG 24 hr tablet Take 0.5 tablets (12.5 mg total) by mouth daily. 10/27/17   Strader, Lennart Pall, PA-C  permethrin (ELIMITE) 5 % cream Apply to affected area once from head to toe especially in areas that are itching.  Keep on overnight.  Then rinse off. 11/18/17   Adriana Simas,  Arlys John, MD    Family History Family History  Problem Relation Age of Onset  . Stroke Mother        deceased    Social History Social History   Tobacco Use  . Smoking status: Never Smoker  . Smokeless tobacco: Never Used  Substance Use Topics  . Alcohol use: No  . Drug use: No     Allergies   Patient has no known allergies.   Review of Systems Review of Systems  All other systems reviewed and are negative.    Physical Exam Updated Vital Signs BP 133/88 (BP Location: Right Arm)   Pulse 90   Temp (!) 97.3 F (36.3 C) (Temporal)   Resp 20   SpO2 100%   Physical Exam  Constitutional: She is oriented to person, place, and time. She appears well-developed and well-nourished.    HENT:  Head: Normocephalic and atraumatic.  Eyes: Conjunctivae are normal.  Neck: Neck supple.  Cardiovascular: Normal rate and regular rhythm.  Pulmonary/Chest: Effort normal and breath sounds normal.  Abdominal: Soft. Bowel sounds are normal.  Musculoskeletal: Normal range of motion.  Neurological: She is alert and oriented to person, place, and time.  Skin:  Discrete papular erythematous rash on left lower back and in webspaces of fingers.  Psychiatric: She has a normal mood and affect. Her behavior is normal.  Nursing note and vitals reviewed.    ED Treatments / Results  Labs (all labs ordered are listed, but only abnormal results are displayed) Labs Reviewed - No data to display  EKG  EKG Interpretation None       Radiology No results found.  Procedures Procedures (including critical care time)  Medications Ordered in ED Medications - No data to display   Initial Impression / Assessment and Plan / ED Course  I have reviewed the triage vital signs and the nursing notes.  Pertinent labs & imaging results that were available during my care of the patient were reviewed by me and considered in my medical decision making (see chart for details).    Pruritus is now evolved into the web spaces of the fingers. I suspect the patient has scabies.  She is nontoxic-appearing.  Will Rx permethrin 5% cream.  Final Clinical Impressions(s) / ED Diagnoses   Final diagnoses:  Scabies    ED Discharge Orders        Ordered    permethrin (ELIMITE) 5 % cream     11/18/17 1344       Donnetta Hutching, MD 11/18/17 1354

## 2017-11-18 NOTE — Discharge Instructions (Signed)
Wash your bed linens and clothes.  Apply ointment to all of your body particularly the itching areas.  Keep on overnight.  Rinse off in the morning.  Can reapply cream and 1-2 weeks.

## 2017-11-20 ENCOUNTER — Telehealth: Payer: Self-pay | Admitting: Student

## 2017-11-20 NOTE — Telephone Encounter (Signed)
Patient was diagnosed with scabies in the ED.I recommended she call her pcp, she states she will

## 2017-11-20 NOTE — Telephone Encounter (Signed)
Patient itching so bad    Wants someone to contact her to see of anything can be done

## 2017-11-22 ENCOUNTER — Emergency Department (HOSPITAL_COMMUNITY): Payer: Medicare Other

## 2017-11-22 ENCOUNTER — Encounter (HOSPITAL_COMMUNITY): Payer: Self-pay

## 2017-11-22 ENCOUNTER — Other Ambulatory Visit: Payer: Self-pay

## 2017-11-22 ENCOUNTER — Inpatient Hospital Stay (HOSPITAL_COMMUNITY)
Admission: EM | Admit: 2017-11-22 | Discharge: 2017-11-29 | DRG: 291 | Disposition: A | Discharge status: Skilled Nursing Facility | Payer: Medicare Other | Attending: Internal Medicine | Admitting: Internal Medicine

## 2017-11-22 ENCOUNTER — Telehealth: Payer: Self-pay | Admitting: Cardiology

## 2017-11-22 DIAGNOSIS — N3281 Overactive bladder: Secondary | ICD-10-CM | POA: Diagnosis present

## 2017-11-22 DIAGNOSIS — I5084 End stage heart failure: Secondary | ICD-10-CM | POA: Diagnosis present

## 2017-11-22 DIAGNOSIS — N183 Chronic kidney disease, stage 3 unspecified: Secondary | ICD-10-CM

## 2017-11-22 DIAGNOSIS — Z681 Body mass index (BMI) 19 or less, adult: Secondary | ICD-10-CM

## 2017-11-22 DIAGNOSIS — I447 Left bundle-branch block, unspecified: Secondary | ICD-10-CM | POA: Diagnosis present

## 2017-11-22 DIAGNOSIS — B86 Scabies: Secondary | ICD-10-CM | POA: Diagnosis not present

## 2017-11-22 DIAGNOSIS — I248 Other forms of acute ischemic heart disease: Secondary | ICD-10-CM | POA: Diagnosis present

## 2017-11-22 DIAGNOSIS — I5043 Acute on chronic combined systolic (congestive) and diastolic (congestive) heart failure: Secondary | ICD-10-CM | POA: Diagnosis present

## 2017-11-22 DIAGNOSIS — I471 Supraventricular tachycardia: Secondary | ICD-10-CM | POA: Diagnosis present

## 2017-11-22 DIAGNOSIS — I4891 Unspecified atrial fibrillation: Secondary | ICD-10-CM | POA: Diagnosis present

## 2017-11-22 DIAGNOSIS — R5381 Other malaise: Secondary | ICD-10-CM

## 2017-11-22 DIAGNOSIS — N170 Acute kidney failure with tubular necrosis: Secondary | ICD-10-CM | POA: Diagnosis present

## 2017-11-22 DIAGNOSIS — M1990 Unspecified osteoarthritis, unspecified site: Secondary | ICD-10-CM | POA: Diagnosis present

## 2017-11-22 DIAGNOSIS — R34 Anuria and oliguria: Secondary | ICD-10-CM | POA: Diagnosis present

## 2017-11-22 DIAGNOSIS — E871 Hypo-osmolality and hyponatremia: Secondary | ICD-10-CM | POA: Diagnosis present

## 2017-11-22 DIAGNOSIS — R54 Age-related physical debility: Secondary | ICD-10-CM | POA: Diagnosis present

## 2017-11-22 DIAGNOSIS — I2510 Atherosclerotic heart disease of native coronary artery without angina pectoris: Secondary | ICD-10-CM | POA: Diagnosis present

## 2017-11-22 DIAGNOSIS — Z86711 Personal history of pulmonary embolism: Secondary | ICD-10-CM

## 2017-11-22 DIAGNOSIS — E43 Unspecified severe protein-calorie malnutrition: Secondary | ICD-10-CM | POA: Diagnosis present

## 2017-11-22 DIAGNOSIS — R402413 Glasgow coma scale score 13-15, at hospital admission: Secondary | ICD-10-CM | POA: Diagnosis present

## 2017-11-22 DIAGNOSIS — I081 Rheumatic disorders of both mitral and tricuspid valves: Secondary | ICD-10-CM | POA: Diagnosis present

## 2017-11-22 DIAGNOSIS — R7989 Other specified abnormal findings of blood chemistry: Secondary | ICD-10-CM | POA: Diagnosis present

## 2017-11-22 DIAGNOSIS — Z66 Do not resuscitate: Secondary | ICD-10-CM | POA: Diagnosis present

## 2017-11-22 DIAGNOSIS — N184 Chronic kidney disease, stage 4 (severe): Secondary | ICD-10-CM | POA: Diagnosis present

## 2017-11-22 DIAGNOSIS — E875 Hyperkalemia: Secondary | ICD-10-CM | POA: Diagnosis present

## 2017-11-22 DIAGNOSIS — R64 Cachexia: Secondary | ICD-10-CM | POA: Diagnosis present

## 2017-11-22 DIAGNOSIS — R0602 Shortness of breath: Secondary | ICD-10-CM

## 2017-11-22 DIAGNOSIS — I130 Hypertensive heart and chronic kidney disease with heart failure and stage 1 through stage 4 chronic kidney disease, or unspecified chronic kidney disease: Secondary | ICD-10-CM | POA: Diagnosis not present

## 2017-11-22 DIAGNOSIS — Z8673 Personal history of transient ischemic attack (TIA), and cerebral infarction without residual deficits: Secondary | ICD-10-CM

## 2017-11-22 DIAGNOSIS — R0609 Other forms of dyspnea: Secondary | ICD-10-CM

## 2017-11-22 DIAGNOSIS — D631 Anemia in chronic kidney disease: Secondary | ICD-10-CM | POA: Diagnosis present

## 2017-11-22 DIAGNOSIS — I509 Heart failure, unspecified: Secondary | ICD-10-CM

## 2017-11-22 DIAGNOSIS — E119 Type 2 diabetes mellitus without complications: Secondary | ICD-10-CM

## 2017-11-22 DIAGNOSIS — E1121 Type 2 diabetes mellitus with diabetic nephropathy: Secondary | ICD-10-CM | POA: Diagnosis present

## 2017-11-22 DIAGNOSIS — I959 Hypotension, unspecified: Secondary | ICD-10-CM | POA: Diagnosis present

## 2017-11-22 DIAGNOSIS — I10 Essential (primary) hypertension: Secondary | ICD-10-CM | POA: Diagnosis present

## 2017-11-22 DIAGNOSIS — Z7982 Long term (current) use of aspirin: Secondary | ICD-10-CM

## 2017-11-22 DIAGNOSIS — Z79899 Other long term (current) drug therapy: Secondary | ICD-10-CM

## 2017-11-22 DIAGNOSIS — I428 Other cardiomyopathies: Secondary | ICD-10-CM | POA: Diagnosis present

## 2017-11-22 DIAGNOSIS — Z7189 Other specified counseling: Secondary | ICD-10-CM

## 2017-11-22 DIAGNOSIS — Z515 Encounter for palliative care: Secondary | ICD-10-CM

## 2017-11-22 DIAGNOSIS — E1122 Type 2 diabetes mellitus with diabetic chronic kidney disease: Secondary | ICD-10-CM | POA: Diagnosis present

## 2017-11-22 DIAGNOSIS — T380X5A Adverse effect of glucocorticoids and synthetic analogues, initial encounter: Secondary | ICD-10-CM | POA: Diagnosis present

## 2017-11-22 HISTORY — DX: Supraventricular tachycardia: I47.1

## 2017-11-22 HISTORY — DX: Chronic combined systolic (congestive) and diastolic (congestive) heart failure: I50.42

## 2017-11-22 HISTORY — DX: Nonrheumatic mitral (valve) insufficiency: I34.0

## 2017-11-22 HISTORY — DX: Chronic kidney disease, stage 4 (severe): N18.4

## 2017-11-22 HISTORY — DX: Left bundle-branch block, unspecified: I44.7

## 2017-11-22 HISTORY — DX: Cerebral infarction, unspecified: I63.9

## 2017-11-22 HISTORY — DX: Supraventricular tachycardia, unspecified: I47.10

## 2017-11-22 NOTE — Telephone Encounter (Signed)
States that patient is itching all over and was told by ED physician that it was coming from poor circulation. Please contact patient's POA to discuss what should be done. / tg

## 2017-11-22 NOTE — Telephone Encounter (Signed)
Spoke with pt's POA. Let him know that ED doctor stated that pt had scabies and he needed to follow up with the pt's pcp. I sent pt's pcp a copy of the ED note.

## 2017-11-22 NOTE — Telephone Encounter (Signed)
Returned pt's POA call. No answer, left message for Ron to return my call.

## 2017-11-22 NOTE — ED Triage Notes (Signed)
Pt reported to ems that she felt sob and "wanted to have someone to talk to"  Per ems, pt not having any resp diff or sob at this time.  Pt lives by herself, reported to ems her breathing has been "like this for about a month"  Pt is awake, alert, oriented, nad

## 2017-11-23 ENCOUNTER — Other Ambulatory Visit: Payer: Self-pay

## 2017-11-23 ENCOUNTER — Encounter (HOSPITAL_COMMUNITY): Payer: Self-pay | Admitting: Physician Assistant

## 2017-11-23 DIAGNOSIS — I447 Left bundle-branch block, unspecified: Secondary | ICD-10-CM

## 2017-11-23 DIAGNOSIS — Z515 Encounter for palliative care: Secondary | ICD-10-CM | POA: Diagnosis not present

## 2017-11-23 DIAGNOSIS — E871 Hypo-osmolality and hyponatremia: Secondary | ICD-10-CM | POA: Diagnosis present

## 2017-11-23 DIAGNOSIS — I10 Essential (primary) hypertension: Secondary | ICD-10-CM | POA: Diagnosis not present

## 2017-11-23 DIAGNOSIS — E1122 Type 2 diabetes mellitus with diabetic chronic kidney disease: Secondary | ICD-10-CM | POA: Diagnosis present

## 2017-11-23 DIAGNOSIS — I4891 Unspecified atrial fibrillation: Secondary | ICD-10-CM | POA: Diagnosis present

## 2017-11-23 DIAGNOSIS — R5381 Other malaise: Secondary | ICD-10-CM | POA: Diagnosis not present

## 2017-11-23 DIAGNOSIS — I248 Other forms of acute ischemic heart disease: Secondary | ICD-10-CM | POA: Diagnosis present

## 2017-11-23 DIAGNOSIS — N184 Chronic kidney disease, stage 4 (severe): Secondary | ICD-10-CM | POA: Diagnosis present

## 2017-11-23 DIAGNOSIS — B86 Scabies: Secondary | ICD-10-CM | POA: Diagnosis not present

## 2017-11-23 DIAGNOSIS — I519 Heart disease, unspecified: Secondary | ICD-10-CM

## 2017-11-23 DIAGNOSIS — I130 Hypertensive heart and chronic kidney disease with heart failure and stage 1 through stage 4 chronic kidney disease, or unspecified chronic kidney disease: Secondary | ICD-10-CM | POA: Diagnosis not present

## 2017-11-23 DIAGNOSIS — E875 Hyperkalemia: Secondary | ICD-10-CM | POA: Diagnosis present

## 2017-11-23 DIAGNOSIS — I2510 Atherosclerotic heart disease of native coronary artery without angina pectoris: Secondary | ICD-10-CM

## 2017-11-23 DIAGNOSIS — I5043 Acute on chronic combined systolic (congestive) and diastolic (congestive) heart failure: Secondary | ICD-10-CM

## 2017-11-23 DIAGNOSIS — R64 Cachexia: Secondary | ICD-10-CM | POA: Diagnosis present

## 2017-11-23 DIAGNOSIS — E119 Type 2 diabetes mellitus without complications: Secondary | ICD-10-CM | POA: Diagnosis not present

## 2017-11-23 DIAGNOSIS — I081 Rheumatic disorders of both mitral and tricuspid valves: Secondary | ICD-10-CM | POA: Diagnosis present

## 2017-11-23 DIAGNOSIS — R34 Anuria and oliguria: Secondary | ICD-10-CM | POA: Diagnosis present

## 2017-11-23 DIAGNOSIS — E43 Unspecified severe protein-calorie malnutrition: Secondary | ICD-10-CM | POA: Diagnosis not present

## 2017-11-23 DIAGNOSIS — Z681 Body mass index (BMI) 19 or less, adult: Secondary | ICD-10-CM | POA: Diagnosis not present

## 2017-11-23 DIAGNOSIS — N170 Acute kidney failure with tubular necrosis: Secondary | ICD-10-CM | POA: Diagnosis not present

## 2017-11-23 DIAGNOSIS — I132 Hypertensive heart and chronic kidney disease with heart failure and with stage 5 chronic kidney disease, or end stage renal disease: Secondary | ICD-10-CM | POA: Diagnosis not present

## 2017-11-23 DIAGNOSIS — I471 Supraventricular tachycardia: Secondary | ICD-10-CM

## 2017-11-23 DIAGNOSIS — N183 Chronic kidney disease, stage 3 unspecified: Secondary | ICD-10-CM

## 2017-11-23 DIAGNOSIS — Z66 Do not resuscitate: Secondary | ICD-10-CM | POA: Diagnosis present

## 2017-11-23 DIAGNOSIS — Z7189 Other specified counseling: Secondary | ICD-10-CM | POA: Diagnosis not present

## 2017-11-23 DIAGNOSIS — I959 Hypotension, unspecified: Secondary | ICD-10-CM | POA: Diagnosis present

## 2017-11-23 DIAGNOSIS — I428 Other cardiomyopathies: Secondary | ICD-10-CM | POA: Diagnosis present

## 2017-11-23 DIAGNOSIS — I509 Heart failure, unspecified: Secondary | ICD-10-CM

## 2017-11-23 DIAGNOSIS — R54 Age-related physical debility: Secondary | ICD-10-CM | POA: Diagnosis present

## 2017-11-23 DIAGNOSIS — E1121 Type 2 diabetes mellitus with diabetic nephropathy: Secondary | ICD-10-CM | POA: Diagnosis present

## 2017-11-23 DIAGNOSIS — R0602 Shortness of breath: Secondary | ICD-10-CM | POA: Diagnosis not present

## 2017-11-23 LAB — CBC WITH DIFFERENTIAL/PLATELET
BASOS ABS: 0 10*3/uL (ref 0.0–0.1)
BASOS PCT: 0 %
EOS PCT: 1 %
Eosinophils Absolute: 0 10*3/uL (ref 0.0–0.7)
HCT: 35.4 % — ABNORMAL LOW (ref 36.0–46.0)
Hemoglobin: 11.2 g/dL — ABNORMAL LOW (ref 12.0–15.0)
Lymphocytes Relative: 18 %
Lymphs Abs: 1.1 10*3/uL (ref 0.7–4.0)
MCH: 27.3 pg (ref 26.0–34.0)
MCHC: 31.6 g/dL (ref 30.0–36.0)
MCV: 86.1 fL (ref 78.0–100.0)
MONO ABS: 0.6 10*3/uL (ref 0.1–1.0)
Monocytes Relative: 10 %
Neutro Abs: 4.2 10*3/uL (ref 1.7–7.7)
Neutrophils Relative %: 71 %
PLATELETS: 179 10*3/uL (ref 150–400)
RBC: 4.11 MIL/uL (ref 3.87–5.11)
RDW: 14.1 % (ref 11.5–15.5)
WBC: 5.9 10*3/uL (ref 4.0–10.5)

## 2017-11-23 LAB — TROPONIN I
TROPONIN I: 0.09 ng/mL — AB (ref <0.03)
TROPONIN I: 0.11 ng/mL — AB (ref <0.03)
Troponin I: 0.08 ng/mL (ref <0.03)
Troponin I: 0.09 ng/mL (ref <0.03)

## 2017-11-23 LAB — COMPREHENSIVE METABOLIC PANEL
ALBUMIN: 3.2 g/dL — AB (ref 3.5–5.0)
ALK PHOS: 128 U/L — AB (ref 38–126)
ALT: 23 U/L (ref 14–54)
AST: 26 U/L (ref 15–41)
Anion gap: 12 (ref 5–15)
BILIRUBIN TOTAL: 1.6 mg/dL — AB (ref 0.3–1.2)
BUN: 42 mg/dL — AB (ref 6–20)
CALCIUM: 9.5 mg/dL (ref 8.9–10.3)
CO2: 19 mmol/L — ABNORMAL LOW (ref 22–32)
CREATININE: 2.08 mg/dL — AB (ref 0.44–1.00)
Chloride: 106 mmol/L (ref 101–111)
GFR calc Af Amer: 23 mL/min — ABNORMAL LOW (ref >60)
GFR, EST NON AFRICAN AMERICAN: 20 mL/min — AB (ref >60)
Glucose, Bld: 145 mg/dL — ABNORMAL HIGH (ref 65–99)
Potassium: 4.5 mmol/L (ref 3.5–5.1)
Sodium: 137 mmol/L (ref 135–145)
Total Protein: 5.8 g/dL — ABNORMAL LOW (ref 6.5–8.1)

## 2017-11-23 LAB — GLUCOSE, CAPILLARY
GLUCOSE-CAPILLARY: 108 mg/dL — AB (ref 65–99)
Glucose-Capillary: 142 mg/dL — ABNORMAL HIGH (ref 65–99)
Glucose-Capillary: 157 mg/dL — ABNORMAL HIGH (ref 65–99)
Glucose-Capillary: 147 mg/dL — ABNORMAL HIGH (ref 65–99)

## 2017-11-23 LAB — MAGNESIUM: MAGNESIUM: 1.5 mg/dL — AB (ref 1.7–2.4)

## 2017-11-23 LAB — BASIC METABOLIC PANEL
ANION GAP: 13 (ref 5–15)
BUN: 46 mg/dL — ABNORMAL HIGH (ref 6–20)
CO2: 18 mmol/L — ABNORMAL LOW (ref 22–32)
Calcium: 9.4 mg/dL (ref 8.9–10.3)
Chloride: 102 mmol/L (ref 101–111)
Creatinine, Ser: 2.38 mg/dL — ABNORMAL HIGH (ref 0.44–1.00)
GFR calc Af Amer: 19 mL/min — ABNORMAL LOW (ref >60)
GFR, EST NON AFRICAN AMERICAN: 17 mL/min — AB (ref >60)
GLUCOSE: 147 mg/dL — AB (ref 65–99)
POTASSIUM: 4.8 mmol/L (ref 3.5–5.1)
Sodium: 133 mmol/L — ABNORMAL LOW (ref 135–145)

## 2017-11-23 LAB — MRSA PCR SCREENING: MRSA by PCR: NEGATIVE

## 2017-11-23 LAB — URINALYSIS, ROUTINE W REFLEX MICROSCOPIC
BACTERIA UA: NONE SEEN
Bilirubin Urine: NEGATIVE
Glucose, UA: NEGATIVE mg/dL
Ketones, ur: NEGATIVE mg/dL
NITRITE: NEGATIVE
PROTEIN: NEGATIVE mg/dL
Specific Gravity, Urine: 1.009 (ref 1.005–1.030)
pH: 5 (ref 5.0–8.0)

## 2017-11-23 LAB — TSH: TSH: 2.86 u[IU]/mL (ref 0.350–4.500)

## 2017-11-23 LAB — BRAIN NATRIURETIC PEPTIDE: B Natriuretic Peptide: >4500 pg/mL — ABNORMAL HIGH (ref 0.0–100.0)

## 2017-11-23 MED ORDER — INSULIN ASPART 100 UNIT/ML ~~LOC~~ SOLN: 0.0000 [IU] | Freq: Every day | SUBCUTANEOUS | Status: DC

## 2017-11-23 MED ORDER — ACETAMINOPHEN 325 MG PO TABS: 650.0000 mg | ORAL_TABLET | Freq: Four times a day (QID) | ORAL | Status: DC | PRN

## 2017-11-23 MED ORDER — SODIUM CHLORIDE 0.9 % IV SOLN: 250.0000 mL | INTRAVENOUS | Status: DC | PRN

## 2017-11-23 MED ORDER — PERMETHRIN 5 % EX CREA
TOPICAL_CREAM | Freq: Once | CUTANEOUS | Status: AC
  Administered 2017-11-23: 09:00:00 via TOPICAL
  Filled 2017-11-23: qty 60

## 2017-11-23 MED ORDER — METOPROLOL SUCCINATE ER 25 MG PO TB24
12.5000 mg | ORAL_TABLET | Freq: Every day | ORAL | Status: DC
Start: 2017-11-23 — End: 2017-11-29
  Administered 2017-11-23 – 2017-11-29 (×7): 12.5 mg via ORAL
  Filled 2017-11-23 (×9): qty 1

## 2017-11-23 MED ORDER — TRAMADOL-ACETAMINOPHEN 37.5-325 MG PO TABS
1.0000 | ORAL_TABLET | Freq: Three times a day (TID) | ORAL | Status: DC | PRN
  Administered 2017-11-23 – 2017-11-29 (×7): 1 via ORAL
  Filled 2017-11-23 (×7): qty 1

## 2017-11-23 MED ORDER — ASPIRIN EC 81 MG PO TBEC
81.0000 mg | DELAYED_RELEASE_TABLET | Freq: Every day | ORAL | Status: DC
  Administered 2017-11-23 – 2017-11-29 (×7): 81 mg via ORAL
  Filled 2017-11-23 (×7): qty 1

## 2017-11-23 MED ORDER — ONDANSETRON HCL 4 MG/2ML IJ SOLN
4.0000 mg | Freq: Four times a day (QID) | INTRAMUSCULAR | Status: DC | PRN
  Administered 2017-11-23 – 2017-11-24 (×3): 4 mg via INTRAVENOUS
  Filled 2017-11-23 (×3): qty 2

## 2017-11-23 MED ORDER — ONDANSETRON HCL 4 MG PO TABS
4.0000 mg | ORAL_TABLET | Freq: Four times a day (QID) | ORAL | Status: DC | PRN
  Administered 2017-11-23: 4 mg via ORAL
  Filled 2017-11-23: qty 1

## 2017-11-23 MED ORDER — FUROSEMIDE 10 MG/ML IJ SOLN
40.0000 mg | Freq: Once | INTRAMUSCULAR | Status: AC
  Administered 2017-11-23: 40 mg via INTRAVENOUS
  Filled 2017-11-23: qty 4

## 2017-11-23 MED ORDER — SODIUM CHLORIDE 0.9% FLUSH
3.0000 mL | Freq: Two times a day (BID) | INTRAVENOUS | Status: DC
  Administered 2017-11-23 – 2017-11-26 (×8): 3 mL via INTRAVENOUS

## 2017-11-23 MED ORDER — HYDROCORTISONE 1 % EX CREA
1.0000 "application " | TOPICAL_CREAM | Freq: Three times a day (TID) | CUTANEOUS | Status: DC | PRN
  Administered 2017-11-29: 1 via TOPICAL
  Filled 2017-11-23: qty 28

## 2017-11-23 MED ORDER — HYDROCORTISONE 2.5 % RE CREA
1.0000 "application " | TOPICAL_CREAM | Freq: Two times a day (BID) | RECTAL | Status: DC | PRN
  Filled 2017-11-23: qty 28.35

## 2017-11-23 MED ORDER — MAGNESIUM SULFATE IN D5W 1-5 GM/100ML-% IV SOLN
1.0000 g | Freq: Once | INTRAVENOUS | Status: AC
  Administered 2017-11-23: 1 g via INTRAVENOUS
  Filled 2017-11-23: qty 100

## 2017-11-23 MED ORDER — SODIUM CHLORIDE 0.9% FLUSH
3.0000 mL | INTRAVENOUS | Status: DC | PRN
Start: 2017-11-23 — End: 2017-11-27

## 2017-11-23 MED ORDER — INSULIN ASPART 100 UNIT/ML ~~LOC~~ SOLN
0.0000 [IU] | Freq: Three times a day (TID) | SUBCUTANEOUS | Status: DC
  Administered 2017-11-23 – 2017-11-27 (×5): 1 [IU] via SUBCUTANEOUS

## 2017-11-23 MED ORDER — HEPARIN SODIUM (PORCINE) 5000 UNIT/ML IJ SOLN
5000.0000 [IU] | Freq: Three times a day (TID) | INTRAMUSCULAR | Status: DC
  Administered 2017-11-23 – 2017-11-29 (×14): 5000 [IU] via SUBCUTANEOUS
  Filled 2017-11-23 (×15): qty 1

## 2017-11-23 MED ORDER — HYDROXYZINE HCL 25 MG PO TABS
25.0000 mg | ORAL_TABLET | Freq: Three times a day (TID) | ORAL | Status: DC | PRN
  Administered 2017-11-23 – 2017-11-26 (×2): 25 mg via ORAL
  Filled 2017-11-23 (×2): qty 1

## 2017-11-23 MED ORDER — ACETAMINOPHEN 650 MG RE SUPP: 650.0000 mg | Freq: Four times a day (QID) | RECTAL | Status: DC | PRN

## 2017-11-23 MED ORDER — HYDROXYZINE PAMOATE 25 MG PO CAPS
25.0000 mg | ORAL_CAPSULE | Freq: Three times a day (TID) | ORAL | Status: DC | PRN
  Filled 2017-11-23: qty 1

## 2017-11-23 MED ORDER — AMIODARONE HCL IN DEXTROSE 360-4.14 MG/200ML-% IV SOLN: 30.0000 mg/h | INTRAVENOUS | Status: DC

## 2017-11-23 MED ORDER — AMIODARONE HCL IN DEXTROSE 360-4.14 MG/200ML-% IV SOLN
60.0000 mg/h | INTRAVENOUS | Status: AC
  Administered 2017-11-23: 60 mg/h via INTRAVENOUS
  Filled 2017-11-23: qty 200

## 2017-11-23 NOTE — Telephone Encounter (Signed)
Pt's POA is returning a call from yesterday

## 2017-11-23 NOTE — ED Notes (Signed)
Pt states her listed Emergency Contact, Whitney Muse, is her Power of [address]. When the POA was called, his wife answered answered the phone and the Pt refused to speak to her and dropped the phone. The RN picked up the phone and asked for Ron. When Ron picked up the phone on his end, the Pt would then agree to speak.

## 2017-11-23 NOTE — ED Notes (Signed)
Pt is restless and agitated.

## 2017-11-23 NOTE — Progress Notes (Signed)
Pt nauseous and vomiting this evening. Emesis is brown in color (pt had diet coke on bedside table). BP remains 80's-90's systolic. Nasal cannula placed for pt comfort. Pt states her back and stomach pain is better but now she "just doesn't feel good". Amiodarone was not restarted due to pt's HR 50's-60's.  PRN Zofran given. No relief of N/V.  Will continue to monitor

## 2017-11-23 NOTE — Consult Note (Addendum)
Cardiology Consultation:   Patient ID: [name] [name]; [id number]; Feb 15, 1924   Admit date: 11/22/2017 Date of Consult: 11/23/2017  Primary Care Provider: Kirstie Peri, MD Primary Cardiologist: Dr. Wyline Mood  Patient Profile:   [name] [name] is a 84 y.o. female with a hx of CAD (diffuse nonobstructive CAD by cath in 2004),chronic combined systolic and diastolic CHF(EF 26% in 2004, normalized in the interim but down to 15-20% by echo in 06/2017), known LBBB,moderate MR,HTN, HLD, CVA (occurring in 2017), DM, and CKD stage III-IV who is being seen today for the evaluation of CHF at the request of Dr. Gonzella Lex.  History of Present Illness:   This is her 4th admission in 6 months. She was admitted in 06/2017 for atypical chest pain, 32 beat run of narrow complex tachycardia, CHF, and hypomagnesemia. She was again admitted 09/2017 with CHF and progressive renal decline requiring nephrology consultation. She also had gait instability. Limited echo 09/2017 showed EF dropping down to 15-20% with mild-mod MR/moderate TR, mildly dilated/reduced RV. Ischemic eval has not been pursued in light of comorbidities with advanced age and renal disease. She was seen in [facility] 10/29/17 for worsening DOE and was found to have an elevated troponin of 0.06 and BNP at 3633 but she left AMA prior to evaluation. She presented back to the ED on 11/01/2017 and was admitted for further diuresis. Cyclic troponin values remained flat at 0.06 and 0.07. She received a dose of IV Lasix was then resumed on her prior to admission PO regimen of Lasix 20 mg daily. Weight was stable at 102 lbs and creatinine was close to baseline at 1.55. She has not been on ACEi/ARB due to softer BP. At last OV 11/13/17 she was up to 107lb with edema on exam, prompting Lasix titration to 40mg  daily. Close f/u was planned but in the interim, the patient presented back to the hospital with palpitations and dyspnea. She also reported itching which was felt due  to scabies (had not started OP tx with permethrin yet). BNP is >4500, troponin trend again low and flat (0.11-0.08-0.09), Cr now 2.08, albumin 3.2, K 4.5, Tbili 1.6, Hgb 11.2. CXR shows possbile increase in small left pleural effusion. Blood pressure has bene labile with occasional soft readings down to 93/57 most recently. She was given 40mg  IV Lasix. Weight 105->104.  We are asked to see for CHF. She reports the Lasix increase as an outpatient helped with increasing UOP but she did not see much difference in dyspnea. This has gradually worsened. She states she actually feels a little nauseated this afternoon after eating squash for lunch. Telemetry originally showed NSR with occasional brief bursts of an atrial tach (following native QRS morphology/LBBB) but this appears to have picked up in frequency the last hour or so. She is unaware of these palpitations. She continues to feel SOB. No chest pain.     Past Medical History:  Diagnosis Date  . Arthritis   . Chronic combined systolic and diastolic CHF (congestive heart failure) (HCC)   . CKD (chronic kidney disease), stage IV (HCC)   . Coronary artery disease    Cath 2004--60% LAD--diffuse and calcified, 40% PDA  . Depression   . Hyperlipidemia   . Hypertension   . LBBB (left bundle branch block)   . Mitral regurgitation   . Nonischemic cardiomyopathy (HCC)    a. EF 30% 2004 b. 10/2006 EF=60-65% c. 06/2017: EF 15-20%, no regional WMA, Grade 1 DD, moderate MR  . Overactive  bladder   . Pulmonary embolism (HCC)    status post  . Stroke (cerebrum) (HCC)   . SVT (supraventricular tachycardia) (HCC)    a. 32 beat run narrow complex rhythm in 06/2017.  . Type II diabetes mellitus (HCC)     Past Surgical History:  Procedure Laterality Date  . APPENDECTOMY  1959  . BACK SURGERY  1967; 1977  . CATARACT EXTRACTION W/ INTRAOCULAR LENS  IMPLANT, BILATERAL  2008   right eye  . CHOLECYSTECTOMY  1959  . TONSILLECTOMY       Inpatient  Medications: Scheduled Meds: . aspirin EC  81 mg Oral Daily  . heparin  5,000 Units Subcutaneous Q8H  . insulin aspart  0-5 Units Subcutaneous QHS  . insulin aspart  0-9 Units Subcutaneous TID WC  . metoprolol succinate  12.5 mg Oral Daily  . sodium chloride flush  3 mL Intravenous Q12H   Continuous Infusions: . sodium chloride     PRN Meds: sodium chloride, acetaminophen **OR** acetaminophen, hydrocortisone, hydrocortisone cream, hydrOXYzine, ondansetron **OR** ondansetron (ZOFRAN) IV, sodium chloride flush  Home Meds: Prior to Admission medications   Medication Sig Start Date End Date Taking? Authorizing Provider  aspirin EC 81 MG EC tablet Take 1 tablet (81 mg total) by mouth daily. 10/19/17  Yes Erick Blinks, MD  furosemide (LASIX) 40 MG tablet Take 1 tablet (40 mg total) by mouth daily. 11/13/17 02/11/18 Yes Strader, Lennart Pall, PA-C  hydrocortisone (ANUSOL-HC) 2.5 % rectal cream Apply 1 application topically 2 (two) times daily as needed for hemorrhoids. 10/18/17  Yes Erick Blinks, MD  hydrocortisone cream 1 % Apply 1 application topically 3 (three) times daily as needed for itching (minor skin irritation). 10/18/17  Yes Erick Blinks, MD  hydrOXYzine (VISTARIL) 25 MG capsule 1 at hs for itching. 11/16/17  Yes Ivery Quale, PA-C  metoprolol succinate (TOPROL-XL) 25 MG 24 hr tablet Take 0.5 tablets (12.5 mg total) by mouth daily. 10/27/17  Yes Strader, [place] M, PA-C  permethrin (ELIMITE) 5 % cream Apply to affected area once from head to toe especially in areas that are itching.  Keep on overnight.  Then rinse off. 11/18/17   Donnetta Hutching, MD    Allergies:   No Known Allergies  Social History:   Social History   Socioeconomic History  . Marital status: Widowed    Spouse name: Not on file  . Number of children: 2  . Years of education: Not on file  . Highest education level: Not on file  Social Needs  . Financial resource strain: Not on file  . Food insecurity - worry:  Not on file  . Food insecurity - inability: Not on file  . Transportation needs - medical: Not on file  . Transportation needs - non-medical: Not on file  Occupational History  . Occupation: retired  Tobacco Use  . Smoking status: Never Smoker  . Smokeless tobacco: Never Used  Substance and Sexual Activity  . Alcohol use: No  . Drug use: No  . Sexual activity: No  Other Topics Concern  . Not on file  Social History Narrative  . Not on file    Family History:   The patient's family history includes Stroke in her mother.  ROS:  Please see the history of present illness.  All other ROS reviewed and negative.     Physical Exam/Data:   Vitals:   11/23/17 0700 11/23/17 0800 11/23/17 0900 11/23/17 1000  BP: (!) 122/98 120/90 (!) 135/111 (!) 93/57  Pulse: 88 99 86 (!) 102  Resp: (!) 25 (!) 25 17 17   Temp:  (!) 97.5 F (36.4 C)    TempSrc:  Axillary    SpO2: 95% 92% 95% 94%  Weight:      Height:        Intake/Output Summary (Last 24 hours) at 11/23/2017 1259 Last data filed at 11/23/2017 0600 Gross per 24 hour  Intake 480 ml  Output -  Net 480 ml   Filed Weights   11/23/17 0146 11/23/17 0617  Weight: 105 lb 14.4 oz (48 kg) 104 lb 15 oz (47.6 kg)   Body mass index is 18.59 kg/m.  General: Chronically ill cachectic frail WF, in no acute distress. Head: Normocephalic, atraumatic, sclera non-icteric, no xanthomas, nares are without discharge.  Neck: Negative for carotid bruits. JVD not elevated. Lungs: Clear bilaterally to auscultation without wheezes, rales, or rhonchi. Mildly increased WOB Heart: RRR with frequent runs of ectopy with S1 S2. No murmurs, rubs, or gallops appreciated. Abdomen: Soft, non-tender, non-distended with normoactive bowel sounds. No hepatomegaly. No rebound/guarding. No obvious abdominal masses. Msk: Generalized atrophy noted Extremities: No clubbing or cyanosis. No edema.  Distal pedal pulses are 2+ and equal bilaterally. Neuro: Alert and  oriented X 3. No facial asymmetry. No focal deficit. Moves all extremities spontaneously. Psych:  Responds to questions appropriately with a normal affect.  EKG:  The EKG was personally reviewed and demonstrates NSR LBBB 88bpm  Relevant CV Studies: 2D echo 10/17/17 Study Conclusions  - Left ventricle: The cavity size was normal. Wall thickness was   normal. The estimated ejection fraction was in the range of 15%   to 20%. Features are consistent with a pseudonormal left   ventricular filling pattern, with concomitant abnormal relaxation   and increased filling pressure (grade 2 diastolic dysfunction).   Doppler parameters are consistent with high ventricular filling   pressure. - Aortic valve: Mildly to moderately calcified annulus. Trileaflet;   mildly thickened leaflets. Valve area (VTI): 1.79 cm^2. Valve   area (Vmax): 1.59 cm^2. - Mitral valve: There was mild to moderate regurgitation. - Left atrium: The atrium was moderately dilated. - Right ventricle: The cavity size was mildly dilated. Systolic   function was mildly reduced. - Right atrium: The atrium was mildly dilated. - Atrial septum: No defect or patent foramen ovale was identified. - Tricuspid valve: There was moderate regurgitation.  Laboratory Data:  Chemistry Recent Labs  Lab 11/16/17 1340 11/22/17 2334  NA 137 137  K 4.2 4.5  CL 106 106  CO2 20* 19*  GLUCOSE 146* 145*  BUN 39* 42*  CREATININE 1.78* 2.08*  CALCIUM 9.5 9.5  GFRNONAA 24* 20*  GFRAA 28* 23*  ANIONGAP 11 12    Recent Labs  Lab 11/22/17 2334  PROT 5.8*  ALBUMIN 3.2*  AST 26  ALT 23  ALKPHOS 128*  BILITOT 1.6*   Hematology Recent Labs  Lab 11/16/17 1340 11/23/17 0115  WBC 5.1 5.9  RBC 4.01 4.11  HGB 10.9* 11.2*  HCT 35.5* 35.4*  MCV 88.5 86.1  MCH 27.2 27.3  MCHC 30.7 31.6  RDW 14.0 14.1  PLT 159 179   Cardiac Enzymes Recent Labs  Lab 11/22/17 2334 11/23/17 0248 11/23/17 0948  TROPONINI 0.11* 0.08* 0.09*   No  results for input(s): TROPIPOC in the last 168 hours.  BNP Recent Labs  Lab 11/22/17 2334  BNP >4,500.0*    DDimer No results for input(s): DDIMER in the last 168 hours.  Radiology/Studies:  Dg Chest 2 View  Result Date: 11/22/2017 CLINICAL DATA:  84 year old female with shortness of breath. EXAM: CHEST  2 VIEW COMPARISON:  Chest radiograph dated 11/01/2017 FINDINGS: Small left pleural effusion similar or slightly increased compared to the prior radiograph with associated compressive atelectasis of the left lung base. Superimposed pneumonia or underlying mass is not excluded. Clinical correlation is recommended. The right lung is clear. There is no pneumothorax. Stable cardiomegaly. Atherosclerotic calcification of the aortic arch. No acute osseous pathology. Osteopenia and degenerative changes of the spine. A pin is noted in the soft tissues of the left axilla. IMPRESSION: Small left pleural effusion similar or slightly increased since the prior radiograph. Clinical correlation and continued follow-up recommended. Cardiomegaly. Pin in the soft tissues of the left axilla and posterior upper arm. This may be related to overlying dressing. Clinical correlation is recommended. Electronically Signed   By: Elgie Collard M.D.   On: 11/22/2017 22:28    Assessment and Plan:   1. Acute on chronic combined CHF - given EF 15%, advanced age, worsening renal failure, and now paroxysmal arrhythmias with recurrent hospitalization, prognosis is poor. Blood pressure prohibits further med titration. She saw some improvement with IV Lasix but this seems to have been temporary. I have reviewed with Dr. Purvis Sheffield and we are recommending goals of care discussion with internal medicine/palliative medicine. Per discussion with Dr. Gonzella Lex, palliative care is not available until Monday but he plans to reach out to POA to further discuss. I do not think Ms. Greathouse grasp show truly ill she is or that her prognosis is  poor. She sees that she had been doing well lately but I suspect based on the last admissions this has been a slow decline. She is cachectic appearing. Continue BB. Will discuss further diuretic with MD but in the meantime, start amiodarone for arrhythmias. She is not a candidate for advanced therapies including home inotropes given advanced age as they will not change our endpoint.  2. Paroxysmal tachycardia - in reviewing with Dr. Purvis Sheffield this appears to be an atrial tach. Difficult to discern if atrial fib, flutter or SVT as some occasions are quite regular and other times irregular. The frequency of this has picked up in the last hour or so, coinciding with her nausea. Per review with MD, will start amiodarone without bolus. Replete Mg with 1g (adjusted for renal insufficiency). Repeat BMET to trend lytes/Cr. Check TSH. Unable to titrate BB due to her hypotension this admission. Have requested recheck BP.  2. AKI on CKD stage III-IV - consider nephrology input this admission.  3. Nonobstructive CAD in 2004 with persistently mildly elevated troponins - troponin levels remain of limited utility given the fact that she was previously not felt to be a candidate for ischemic evaluation given advanced age and renal dysfunction.  4. Mild-moderate MR/moderate TR - may be contributing to presentation to some degree, but not a candidate for any invasive procedures.  5. HTN, more recently limited by hypotension - follow.   For questions or updates, please contact [facility] Please consult [email or web address] for contact info under Cardiology/STEMI.    Signed, Laurann Montana, PA-C  11/23/2017 12:59 PM   The patient was seen and examined, and I agree with the history, physical exam, assessment and plan as documented above, with modifications as noted below. I have also personally reviewed all relevant documentation, old records, labs, and both radiographic and cardiovascular studies. I have also  independently interpreted old and new ECG's.  Briefly,  this is a 84 year old woman with diffuse nonobstructive coronary disease, chronic combined systolic and diastolic heart failure with multiple hospitalizations for the same, LVEF 15-20%, left bundle branch block, moderate mitral regurgitation, hypertension, CVA, diabetes mellitus, and chronic kidney disease stage III-IV.    She has been it again admitted for decompensated heart failure.  This is her fourth hospitalization in 6 months.  She received 1 dose of IV Lasix and creatinine bumped from 1.78 to 2.08. BNP is greater than 4500.  Troponins are minimally elevated.  Blood pressures have been soft and she has been experiencing frequent asymptomatic atrial ectopy with bursts of atrial tachycardia with possible atrial fibrillation.  She was found to be hypomagnesemic and this is in the process of being replaced.  She currently denies chest pain, palpitations, leg swelling, and shortness of breath.  She is lying flat.  I reviewed her chest x-ray which shows a small left pleural effusion which appears similar or slightly increased since the prior x-ray.  ECG shows sinus tachycardia with left bundle branch block and salvos of sinus tach versus atrial tach.  Unfortunately, she has end-stage heart failure with worsening renal failure indicative of cardiorenal syndrome.  She is now having frequent atrial ectopy.  Overall, prognosis is quite poor.  Blood pressures have been as low as 88/61.   At this time, diuretics have been held.  I think she is a candidate for palliative care/hospice.  She has cardiac cachexia and renal failure and hypotension prohibit advancement of cardiac medications including beta blockers.  We will try administering an IV amiodarone infusion to quiet atrial ectopy as magnesium is replaced.  No further recommendations at this time.  Prentice Docker, MD, [facility]  11/23/2017 3:41 PM

## 2017-11-23 NOTE — ED Notes (Signed)
Date and time results received: 11/23/17 0048  Test: Troponin Critical Value: 0.11  Name of Provider Notified: Devoria Albe MD

## 2017-11-23 NOTE — Telephone Encounter (Signed)
Patient's POA stated pt was in the hospital.i told him cardiology had already rounded on her

## 2017-11-23 NOTE — Progress Notes (Signed)
Patient complaining of severe stomach and back pain. She says that she can't take it and it feels like she has to throw up. Patient also having problems with hypotension since starting the Amiodarone. Per Dr. Gonzella Lex we will stop Amiodarone for now to bring up BP and he will order PRN pain medications. Will continue to closely monitor.

## 2017-11-23 NOTE — Progress Notes (Signed)
I had a long goals of care discussion with patient's power of attorney Mr. Julien Girt, patient's cousin and patient at bedside. I have discussed her advanced heart disease with multiple hospitalization in the past 5 months now with recurrent decompensated heart failure and worsened renal function, hypotension the options for aggressive treatment is very limited. Also with her advanced age and malnutrition her overall prognosis is poor. Upon detailed discussion patient understands the overall prognosis and likelihood of deterioration in her symptoms over time and the low utility of providing full scope of treatment or aggressive treatment in the form of mechanical ventilation or cardiac resuscitation. Both she and her power of attorney agree on her being DNR and continue with symptomatic management if possible without further much escalation of care.  She agrees that if her symptoms do not improve with current management the goal would be to transition her to comfort measures.  Patient clearly declines going to any skilled nursing facility.

## 2017-11-23 NOTE — ED Provider Notes (Signed)
[name] Provider Note   CSN: [id number] Arrival date & time: 11/22/17  2140  Time seen 23:16 PM    History   Chief Complaint Chief Complaint  Patient presents with  . Shortness of Breath   Level 5 caveat for age  HPI [name] is a 84 y.o. female.  HPI patient states she became aware that her "heart was beating wrong" and she was feeling short of breath today.  She denies chest pain, states she did have nausea without vomiting.  She denies diaphoresis.  She states she is never felt this way before.  She states her feet started swelling yesterday.  She denies any prior history of heart problems although she sees a cardiologist.  Review of her prior records show she does have coronary artery disease and history of congestive heart failure.  She states 3-4 days ago she got extreme dyspnea on exertion when taking her trash can down to the road.  Patient has also been seen in the ED for pruritus on February 21 in February 23 and was diagnosed with scabies.  She states she just got the lotion filled today and is not treated herself yet.  PCP Kirstie Peri, MD Cardiology  Past Medical History:  Diagnosis Date  . Arthritis   . Coronary artery disease    Cath 2004--60% LAD--diffuse and calcified, 40% PDA  . Depression   . Hyperlipidemia   . Hypertension   . Nonischemic cardiomyopathy (HCC)    a. EF 30% 2004 b. 10/2006 EF=60-65% c. 06/2017: EF 15-20%, no regional WMA, Grade 1 DD, moderate MR  . Overactive bladder   . Pulmonary embolism (HCC)    status post  . Type II diabetes mellitus [name])     Patient Active Problem List   Diagnosis Date Noted  . CHF exacerbation (HCC) 11/01/2017  . Acute on chronic combined systolic and diastolic CHF (congestive heart failure) (HCC) 10/15/2017  . Nonischemic cardiomyopathy (HCC) 10/15/2017  . Coronary artery disease 10/15/2017  . Chest pain 07/20/2017  . Type II diabetes mellitus (HCC)   . PE 10/01/2009  .  Difficulty walking 10/01/2009  . Dysthymic disorder 07/06/2009  . UNSPECIFIED CHRONIC ISCHEMIC HEART DISEASE 07/06/2009  . Left bundle branch block (LBBB) 07/06/2009  . Coronary atherosclerosis 12/12/2007  . BACK PAIN, THORACIC REGION, CHRONIC 12/12/2007  . Hyperlipidemia 09/11/2007  . CATARACTS 09/11/2007  . Essential hypertension 09/11/2007  . OVERACTIVE BLADDER 09/11/2007  . Arthropathy 09/11/2007    Past Surgical History:  Procedure Laterality Date  . APPENDECTOMY  1959  . BACK SURGERY  1967; 1977  . CATARACT EXTRACTION W/ INTRAOCULAR LENS  IMPLANT, BILATERAL  2008   right eye  . CHOLECYSTECTOMY  1959  . TONSILLECTOMY      OB History    Gravida Para Term Preterm AB Living   2 2 2          SAB TAB Ectopic Multiple Live Births                   Home Medications    Prior to Admission medications   Medication Sig Start Date End Date Taking? Authorizing Provider  aspirin EC 81 MG EC tablet Take 1 tablet (81 mg total) by mouth daily. 10/19/17  Yes Erick Blinks, MD  furosemide (LASIX) 40 MG tablet Take 1 tablet (40 mg total) by mouth daily. 11/13/17 02/11/18 Yes Strader, Lennart Pall, PA-C  hydrocortisone (ANUSOL-HC) 2.5 % rectal cream Apply 1 application topically 2 (two) times  daily as needed for hemorrhoids. 10/18/17  Yes Erick Blinks, MD  hydrocortisone cream 1 % Apply 1 application topically 3 (three) times daily as needed for itching (minor skin irritation). 10/18/17  Yes Erick Blinks, MD  hydrOXYzine (VISTARIL) 25 MG capsule 1 at hs for itching. 11/16/17  Yes Ivery Quale, PA-C  metoprolol succinate (TOPROL-XL) 25 MG 24 hr tablet Take 0.5 tablets (12.5 mg total) by mouth daily. 10/27/17  Yes Strader, [place] M, PA-C  permethrin (ELIMITE) 5 % cream Apply to affected area once from head to toe especially in areas that are itching.  Keep on overnight.  Then rinse off. 11/18/17   Donnetta Hutching, MD    Family History Family History  Problem Relation Age of Onset  . Stroke  Mother        deceased    Social History Social History   Tobacco Use  . Smoking status: Never Smoker  . Smokeless tobacco: Never Used  Substance Use Topics  . Alcohol use: No  . Drug use: No     Allergies   Patient has no known allergies.   Review of Systems Review of Systems  Unable to perform ROS: Age     Physical Exam Updated Vital Signs BP 123/83   Pulse 91   Temp 97.8 F (36.6 C) (Oral)   Resp 20   SpO2 96%   Vital signs normal    Physical Exam  Constitutional: She is oriented to person, place, and time.  Non-toxic appearance. She does not appear ill. No distress.  Frail elderly female in the fetal position on her stretcher  HENT:  Head: Normocephalic and atraumatic.  Right Ear: External ear normal.  Left Ear: External ear normal.  Nose: Nose normal. No mucosal edema or rhinorrhea.  Mouth/Throat: Oropharynx is clear and moist and mucous membranes are normal. No dental abscesses or uvula swelling.  Eyes: Conjunctivae and EOM are normal. Pupils are equal, round, and reactive to light.  Neck: Normal range of motion and full passive range of motion without pain. Neck supple.  Cardiovascular: Normal rate, regular rhythm and normal heart sounds. Exam reveals no gallop and no friction rub.  No murmur heard. Pulmonary/Chest: Effort normal. No respiratory distress. She has decreased breath sounds. She has no wheezes. She has no rhonchi. She has no rales. She exhibits no tenderness and no crepitus.  Abdominal: Soft. Normal appearance and bowel sounds are normal. She exhibits no distension. There is no tenderness. There is no rebound and no guarding.  Musculoskeletal: Normal range of motion. She exhibits no tenderness.       Right lower leg: She exhibits edema.       Left lower leg: She exhibits edema.  Moves all extremities well.  Patient is noted to have soft pitting edema on the dorsum of her feet and around both ankles.  Neurological: She is alert and oriented  to person, place, and time. She has normal strength. No cranial nerve deficit.  Skin: Skin is warm, dry and intact. No rash noted. No erythema. There is pallor.  Psychiatric: She has a normal mood and affect. Her speech is normal and behavior is normal. Her mood appears not anxious.  Nursing note and vitals reviewed.    ED Treatments / Results  Labs (all labs ordered are listed, but only abnormal results are displayed) Results for orders placed or performed during the [name] encounter of 11/22/17  Comprehensive metabolic panel  Result Value Ref Range   Sodium 137 135 - 145  mmol/L   Potassium 4.5 3.5 - 5.1 mmol/L   Chloride 106 101 - 111 mmol/L   CO2 19 (L) 22 - 32 mmol/L   Glucose, Bld 145 (H) 65 - 99 mg/dL   BUN 42 (H) 6 - 20 mg/dL   Creatinine, Ser 4.09 (H) 0.44 - 1.00 mg/dL   Calcium 9.5 8.9 - 81.1 mg/dL   Total Protein 5.8 (L) 6.5 - 8.1 g/dL   Albumin 3.2 (L) 3.5 - 5.0 g/dL   AST 26 15 - 41 U/L   ALT 23 14 - 54 U/L   Alkaline Phosphatase 128 (H) 38 - 126 U/L   Total Bilirubin 1.6 (H) 0.3 - 1.2 mg/dL   GFR calc non Af Amer 20 (L) >60 mL/min   GFR calc Af Amer 23 (L) >60 mL/min   Anion gap 12 5 - 15  Troponin I  Result Value Ref Range   Troponin I 0.11 (HH) <0.03 ng/mL  Brain natriuretic peptide  Result Value Ref Range   B Natriuretic Peptide >4,500.0 (H) 0.0 - 100.0 pg/mL  Magnesium  Result Value Ref Range   Magnesium 1.5 (L) 1.7 - 2.4 mg/dL   Laboratory interpretation all normal except renal insufficiency, malnutrition, positive troponin, mild hypo-magnesium    EKG  EKG Interpretation  Date/Time:  Wednesday November 22 2017 22:43:46 EST Ventricular Rate:  88 PR Interval:    QRS Duration: 142 QT Interval:  410 QTC Calculation: 497 R Axis:   -67 Text Interpretation:  Sinus rhythm Left bundle branch block No significant change since last tracing 01 Nov 2017 Confirmed by Devoria Albe ([id number]) on 11/22/2017 11:06:30 PM      #2 EKG  EKG  Interpretation  Date/Time:  Wednesday November 22 2017 23:28:27 EST Ventricular Rate:  114 PR Interval:    QRS Duration: 142 QT Interval:  395 QTC Calculation: 544 R Axis:   -92 Text Interpretation:  Sinus tachycardia  with short salvos of sinus tachcardia or atrial fib Nonspecific IVCD with LAD Consider left ventricular hypertrophy Inferior infarct, acute (RCA) Probable RV involvement, suggest recording right precordial leads Confirmed by Devoria Albe ([id number]) on 11/23/2017 12:50:38 AM        Radiology Dg Chest 2 View  Result Date: 11/22/2017 CLINICAL DATA:  84 year old female with shortness of breath. EXAM: CHEST  2 VIEW COMPARISON:  Chest radiograph dated 11/01/2017 FINDINGS: Small left pleural effusion similar or slightly increased compared to the prior radiograph with associated compressive atelectasis of the left lung base. Superimposed pneumonia or underlying mass is not excluded. Clinical correlation is recommended. The right lung is clear. There is no pneumothorax. Stable cardiomegaly. Atherosclerotic calcification of the aortic arch. No acute osseous pathology. Osteopenia and degenerative changes of the spine. A pin is noted in the soft tissues of the left axilla. IMPRESSION: Small left pleural effusion similar or slightly increased since the prior radiograph. Clinical correlation and continued follow-up recommended. Cardiomegaly. Pin in the soft tissues of the left axilla and posterior upper arm. This may be related to overlying dressing. Clinical correlation is recommended. Electronically Signed   By: Elgie Collard M.D.   On: 11/22/2017 22:28    Procedures Procedures (including critical care time)  Echocardiogram: 10/17/2017 Study Conclusions  - Left ventricle: The cavity size was normal. Wall thickness was normal. The estimated ejection fraction was in the range of 15% to 20%. Features are consistent with a pseudonormal left ventricular filling pattern, with  concomitant abnormal relaxation and increased filling pressure (grade 2 diastolic dysfunction). Doppler  parameters are consistent with high ventricular filling pressure. - Aortic valve: Mildly to moderately calcified annulus. Trileaflet; mildly thickened leaflets. Valve area (VTI): 1.79 cm^2. Valve area (Vmax): 1.59 cm^2. - Mitral valve: There was mild to moderate regurgitation. - Left atrium: The atrium was moderately dilated. - Right ventricle: The cavity size was mildly dilated. Systolic function was mildly reduced. - Right atrium: The atrium was mildly dilated. - Atrial septum: No defect or patent foramen ovale was identified. - Tricuspid valve: There was moderate regurgitation.    Medications Ordered in ED Medications  furosemide (LASIX) injection 40 mg (not administered)     Initial Impression / Assessment and Plan / ED Course  I have reviewed the triage vital signs and the nursing notes.  Pertinent labs & imaging results that were available during my care of the patient were reviewed by me and considered in my medical decision making (see chart for details).    Reviewing her prior visit shows patient was last seen by her cardiology PA on February 18.  At that time she was complaining of itching.  She had just been admitted to the [name] for exacerbation of congestive heart failure.  At that time she was noted to have pitting edema and complained of weight gain and dyspnea on exertion and her Lasix was increased to 40 mg daily.  During my exam patient was noted to have little short salvos of some tachycardia without distinct P waves in front of each complex, questionable short salvos of atrial fib or SVT.  On review of patient's labs her BNP is even higher than her baseline.  Her renal insufficiency is may be mildly worse than her baseline.  She was given Lasix, I decided to give her 40 IV since she had only been on 40 mg a day as an outpatient.  There was some  discussion by her cardiologist that her blood pressure was borderline and they did not want to adjust some of her medications for fear of making her hypotensive.  Patient was kept on precautions because she is not treated herself for her scabies yet.  01:15 AM Dr Sherryll Burger, hospitalist, will admit   Final Clinical Impressions(s) / ED Diagnoses   Final diagnoses:  SOB (shortness of breath)  Scabies  Acute on chronic congestive heart failure, unspecified heart failure type (HCC)  DOE (dyspnea on exertion)    Plan admission  Devoria Albe, MD, Concha Pyo, MD 11/23/17 [phone]

## 2017-11-23 NOTE — Care Management Note (Signed)
Case Management Note  Patient Details  Name: PAULINE [facility]ITCH[facility] MRN: [id number] Date of Birth: 06-26-1924   If discussed at Long Length of Stay Meetings, dates discussed:  11/23/2017  Additional Comments:  Renatta Shrieves, [name], RN 11/23/2017, 12:05 PM

## 2017-11-23 NOTE — H&P (Addendum)
History and Physical    [name] ZOX:[id number] DOB: 08/31/1924 DOA: 11/22/2017  PCP: Kirstie Peri, MD   Patient coming from: Home  Chief Complaint: Palpitations and dyspnea  HPI: [name] is a 84 y.o. female with medical history significant for CAD, chronic combined systolic and diastolic CHF with EF 15-20%, stage III-IV CKD, known left bundle branch block, hypertension, dyslipidemia, and prior CVA who presents to the emergency department with some palpitations along with dyspnea.  She has recently seen her cardiologist Dr. Wyline Mood on 2/18 and was noted to require an increase Lasix dose of 40 mg which she claims to be taking.  This was done in the office shortly after a couple admissions earlier this month for CHF exacerbation, where she left AMA on 2/7.  Additionally she was recently seen in the ED on 2/21 as well as 2/23 and was noted to have significant itching for which she was diagnosed with scabies, but has not started treatment with Permethrin cream.  She denies any chest pain, or diaphoresis. She did have some mild nausea but no vomiting noted. She has mild lower extremity edema along with dyspnea on exertion.   ED Course: Vital signs are noted to be stable and EKG demonstrates an old left bundle branch block, but otherwise normal sinus rhythm.  Labs are generally unremarkable aside from troponin of 0.11 and BNP greater than 4500.  Her creatinine has also bumped up to 2.08.  She has received 40 mg of IV Lasix and is noted to have some soft blood pressure readings.  She is able to lay flat on room air currently and does not appear to have significant dyspnea.  Chest x-ray with small left pleural effusion and some cardiomegaly noted.  Review of Systems: All others reviewed and otherwise negative.  Past Medical History:  Diagnosis Date  . Arthritis   . Coronary artery disease    Cath 2004--60% LAD--diffuse and calcified, 40% PDA  . Depression   . Hyperlipidemia   .  Hypertension   . Nonischemic cardiomyopathy (HCC)    a. EF 30% 2004 b. 10/2006 EF=60-65% c. 06/2017: EF 15-20%, no regional WMA, Grade 1 DD, moderate MR  . Overactive bladder   . Pulmonary embolism (HCC)    status post  . Type II diabetes mellitus (HCC)     Past Surgical History:  Procedure Laterality Date  . APPENDECTOMY  1959  . BACK SURGERY  1967; 1977  . CATARACT EXTRACTION W/ INTRAOCULAR LENS  IMPLANT, BILATERAL  2008   right eye  . CHOLECYSTECTOMY  1959  . TONSILLECTOMY       reports that  has never smoked. she has never used smokeless tobacco. She reports that she does not drink alcohol or use drugs.  No Known Allergies  Family History  Problem Relation Age of Onset  . Stroke Mother        deceased    Prior to Admission medications   Medication Sig Start Date End Date Taking? Authorizing Provider  aspirin EC 81 MG EC tablet Take 1 tablet (81 mg total) by mouth daily. 10/19/17  Yes Erick Blinks, MD  furosemide (LASIX) 40 MG tablet Take 1 tablet (40 mg total) by mouth daily. 11/13/17 02/11/18 Yes Strader, Lennart Pall, PA-C  hydrocortisone (ANUSOL-HC) 2.5 % rectal cream Apply 1 application topically 2 (two) times daily as needed for hemorrhoids. 10/18/17  Yes Erick Blinks, MD  hydrocortisone cream 1 % Apply 1 application topically 3 (three) times daily as needed  for itching (minor skin irritation). 10/18/17  Yes Erick Blinks, MD  hydrOXYzine (VISTARIL) 25 MG capsule 1 at hs for itching. 11/16/17  Yes Ivery Quale, PA-C  metoprolol succinate (TOPROL-XL) 25 MG 24 hr tablet Take 0.5 tablets (12.5 mg total) by mouth daily. 10/27/17  Yes Strader, [place] M, PA-C  permethrin (ELIMITE) 5 % cream Apply to affected area once from head to toe especially in areas that are itching.  Keep on overnight.  Then rinse off. 11/18/17   Donnetta Hutching, MD    Physical Exam: Vitals:   11/22/17 2330 11/23/17 0000 11/23/17 0030 11/23/17 0146  BP: 103/75 (!) 144/109 123/83   Pulse: 62 87 91     Resp: (!) 27 (!) 24 20   Temp:      TempSrc:      SpO2: 96% 96% 96%   Weight:    48 kg (105 lb 14.4 oz)    Constitutional: NAD, calm, comfortable Vitals:   11/22/17 2330 11/23/17 0000 11/23/17 0030 11/23/17 0146  BP: 103/75 (!) 144/109 123/83   Pulse: 62 87 91   Resp: (!) 27 (!) 24 20   Temp:      TempSrc:      SpO2: 96% 96% 96%   Weight:    48 kg (105 lb 14.4 oz)   Eyes: lids and conjunctivae normal ENMT: Mucous membranes are moist.  Neck: normal, supple Respiratory: clear to auscultation bilaterally. Normal respiratory effort. No accessory muscle use. On RA. Cardiovascular: Regular rate and rhythm, no murmurs. No extremity edema. Abdomen: no tenderness, no distention. Bowel sounds positive.  Musculoskeletal:  No joint deformity upper and lower extremities.   Skin: no rashes, lesions, ulcers.   Labs on Admission: I have personally reviewed following labs and imaging studies  CBC: Recent Labs  Lab 11/16/17 1340 11/23/17 0115  WBC 5.1 5.9  NEUTROABS 3.9 4.2  HGB 10.9* 11.2*  HCT 35.5* 35.4*  MCV 88.5 86.1  PLT 159 179   Basic Metabolic Panel: Recent Labs  Lab 11/16/17 1340 11/22/17 2334  NA 137 137  K 4.2 4.5  CL 106 106  CO2 20* 19*  GLUCOSE 146* 145*  BUN 39* 42*  CREATININE 1.78* 2.08*  CALCIUM 9.5 9.5  MG  --  1.5*   GFR: Estimated Creatinine Clearance: 13.3 mL/min (A) (by C-G formula based on SCr of 2.08 mg/dL (H)). Liver Function Tests: Recent Labs  Lab 11/22/17 2334  AST 26  ALT 23  ALKPHOS 128*  BILITOT 1.6*  PROT 5.8*  ALBUMIN 3.2*   No results for input(s): LIPASE, AMYLASE in the last 168 hours. No results for input(s): AMMONIA in the last 168 hours. Coagulation Profile: No results for input(s): INR, PROTIME in the last 168 hours. Cardiac Enzymes: Recent Labs  Lab 11/22/17 2334  TROPONINI 0.11*   BNP (last 3 results) No results for input(s): PROBNP in the last 8760 hours. HbA1C: No results for input(s): HGBA1C in the last 72  hours. CBG: No results for input(s): GLUCAP in the last 168 hours. Lipid Profile: No results for input(s): CHOL, HDL, LDLCALC, TRIG, CHOLHDL, LDLDIRECT in the last 72 hours. Thyroid Function Tests: No results for input(s): TSH, T4TOTAL, FREET4, T3FREE, THYROIDAB in the last 72 hours. Anemia Panel: No results for input(s): VITAMINB12, FOLATE, FERRITIN, TIBC, IRON, RETICCTPCT in the last 72 hours. Urine analysis:    Component Value Date/Time   COLORURINE YELLOW 11/16/2017 1432   APPEARANCEUR HAZY (A) 11/16/2017 1432   LABSPEC 1.017 11/16/2017 1432  PHURINE 5.0 11/16/2017 1432   GLUCOSEU NEGATIVE 11/16/2017 1432   HGBUR SMALL (A) 11/16/2017 1432   BILIRUBINUR NEGATIVE 11/16/2017 1432   KETONESUR NEGATIVE 11/16/2017 1432   PROTEINUR NEGATIVE 11/16/2017 1432   UROBILINOGEN 0.2 12/30/2007 1850   NITRITE NEGATIVE 11/16/2017 1432   LEUKOCYTESUR TRACE (A) 11/16/2017 1432    Radiological Exams on Admission: Dg Chest 2 View  Result Date: 11/22/2017 CLINICAL DATA:  84 year old female with shortness of breath. EXAM: CHEST  2 VIEW COMPARISON:  Chest radiograph dated 11/01/2017 FINDINGS: Small left pleural effusion similar or slightly increased compared to the prior radiograph with associated compressive atelectasis of the left lung base. Superimposed pneumonia or underlying mass is not excluded. Clinical correlation is recommended. The right lung is clear. There is no pneumothorax. Stable cardiomegaly. Atherosclerotic calcification of the aortic arch. No acute osseous pathology. Osteopenia and degenerative changes of the spine. A pin is noted in the soft tissues of the left axilla. IMPRESSION: Small left pleural effusion similar or slightly increased since the prior radiograph. Clinical correlation and continued follow-up recommended. Cardiomegaly. Pin in the soft tissues of the left axilla and posterior upper arm. This may be related to overlying dressing. Clinical correlation is recommended.  Electronically Signed   By: Elgie Collard M.D.   On: 11/22/2017 22:28    EKG: Independently reviewed. LBBB noted.  Assessment/Plan Principal Problem:   Acute on chronic combined systolic and diastolic CHF (congestive heart failure) (HCC) Active Problems:   Essential hypertension   Left bundle branch block (LBBB)   Type II diabetes mellitus (HCC)   Coronary artery disease   CKD (chronic kidney disease), stage III (HCC)    1. Acute on chronic combined systolic and diastolic CHF.  Patient has already received some IV Lasix in the ED and has soft blood pressure readings at this time.  She otherwise appears euvolemic if not dry and I will hold off on further diuresis as I feel that more of her symptoms are related to severe LV dysfunction.  Appreciate cardiology consultation.  Monitor daily weights, strict I's and O's and keep on fluid restriction while on heart healthy/carb modified diet. 2. AK I on CKD stage III-IV.  She is currently has a bump in her creatinine along with BUN elevation and I suspect this is related to her recent Lasix increase.  Continue to monitor for now and hold off on any further Lasix and nephrotoxic agents.  Heparin for DVT prophylaxis. 3. Scabies. Treat with Permethrin cream and use hydroxyzine as needed for itching. 4. Type 2 diabetes.  She has stable blood glucose readings at the moment.  Maintain on sliding scale insulin and carb modified diet. 5. Essential hypertension.  Currently has some soft BP's, but will hold diuresis and maintain on metoprolol.   DVT prophylaxis: Heparin Code Status: Full Family Communication: None Disposition Plan:TBD after Cardiology evaluation Consults called:Cardiology Admission status: Obs, tele   Jalynne Persico Hoover Brunette DO [facility] Pager [phone]  If 7PM-7AM, please contact night-coverage [email or web address] Password [email or web address]  11/23/2017, 2:09 AM

## 2017-11-23 NOTE — Progress Notes (Signed)
PROGRESS NOTE                                                                                                                                                                                                             Patient Demographics:    [name] [name], is a 84 y.o. female, DOB - 09/18/1924, XLK:[id number]  Admit date - 11/22/2017   Admitting Physician Pratik Hoover Brunette, DO  Outpatient Primary MD for the patient is Kirstie Peri, MD  LOS - 0  Outpatient Specialists:  Dr. Wyline Mood  Chief Complaint  Patient presents with  . Shortness of Breath       Brief Narrative   84 year old female with diffuse nonobstructive CAD, chronic combined systolic and diastolic CHF (last echo in 06/2017 with EF of 15-20%), LBBB, moderate mitral regurgitation, hypertension, history of CVA in 2017, hyperlipidemia, chronic kidney disease stage III (baseline creatinine around 1.4) who was hospitalized both in January and February this month with acute on chronic CHF and her Lasix dose increased during last hospitalization.  (40 mg daily).  Prior to that she was hospitalized in October 2018 with atypical chest pain, narrow complex tachycardia and CHF.  During her hospitalization in January she also had acute on chronic kidney disease and was seen by nephrology. She presented to the ED on 2/27 with palpitation and shortness of breath with finding of acute on chronic combined systolic and diastolic CHF (BNP greater than 4500, mildly elevated troponin and chest x-ray with increased left pleural effusion. She was also found to have acute on chronic kidney disease with creatinine of 2.08. Patient given a dose of IV Lasix and admitted to stepdown unit.      Subjective:   Seen this morning and reports her breathing to be slightly better.  Noted multiple PVCs on the monitor.   Assessment  & Plan :    Principal Problem:   Acute on chronic combined systolic  and diastolic CHF (congestive heart failure) (HCC) Recurrent hospitalizations (for hospitalization and 5 ED visits in the past 5 months for similar symptoms). Has significantly low EF (15-20%), along with LBBB, MR and now with acute kidney injury. Received a dose of IV Lasix in the ED and held further dose due to acute kidney injury and low blood pressure. Overall prognosis is poor.  I had a detailed discussion on  goals of care with her this morning.  Cardiology consult appreciated.  We both agree that she has a poor understanding of her progressively worsening disease. Continue beta-blocker as tolerated.  Not a candidate for further aggressive treatment .  Acute on chronic kidney disease stage III Cardiac needle syndrome versus ATN secondary to recently increase diuretic.  Monitor strict I/O.  Nephrology consult in a.m.  ?Paroxysmal atrial tachycardia Started on amiodarone without bolus by cardiology.  Magnesium replenished.  Monitor elect lites.  Follow TSH.  Adjust beta-blocker dose as blood pressure permits.   Coronary artery disease Nonobstructive CAD in 2004.  Troponin mildly elevated but flat.  No further intervention.  Moderate MR/TR Not a candidate for further intervention.  Type 2 diabetes mellitus Monitor on sliding scale coverage.   Severe protein calorie malnutrition Nutrition consult.   Goals of care Patient wanting full scope of treatment during my earlier discussion but does not seem to have a clear understanding of her disease process.  I discussed in detail with her power of attorney Mr. Julien Girt who agrees that given her progressive worsening symptoms with advanced age and comorbidities she should not undergo aggressive treatment including full resuscitation and agrees with her being DNR and pursue palliative treatment.  He will be coming to see her this afternoon and I will discuss with him and the patient further.  Code Status : Full code  Family Communication  :  Discussed with healthcare power of attorney  Disposition Plan  : Pending hospital course  Barriers For Discharge : Active symptoms  Consults  : Cardiology  Procedures  : None  DVT Prophylaxis  :  Lovenox -   Lab Results  Component Value Date   PLT 179 11/23/2017    Antibiotics  :    Anti-infectives (From admission, onward)   None        Objective:   Vitals:   11/23/17 1100 11/23/17 1200 11/23/17 1300 11/23/17 1400  BP: 110/71 113/77 113/72 105/70  Pulse: 84 84 81 98  Resp: (!) 22 19 (!) 27 20  Temp:      TempSrc:      SpO2:      Weight:      Height:        Wt Readings from Last 3 Encounters:  11/23/17 47.6 kg (104 lb 15 oz)  11/16/17 48.5 kg (107 lb)  11/13/17 48.5 kg (107 lb)     Intake/Output Summary (Last 24 hours) at 11/23/2017 1419 Last data filed at 11/23/2017 0600 Gross per 24 hour  Intake 480 ml  Output -  Net 480 ml     Physical Exam  Gen: Elderly cachetic  female appears fatigued, not in distress HEENT:moist mucosa, supple neck, JVD not appreciated Chest: Diminished bibasilar breath sounds CVS: S1 and S2 irregular, systolic murmur 3/6 GI: soft, NT, ND, BS+ Musculoskeletal: warm, no edema     Data Review:    CBC Recent Labs  Lab 11/23/17 0115  WBC 5.9  HGB 11.2*  HCT 35.4*  PLT 179  MCV 86.1  MCH 27.3  MCHC 31.6  RDW 14.1  LYMPHSABS 1.1  MONOABS 0.6  EOSABS 0.0  BASOSABS 0.0    Chemistries  Recent Labs  Lab 11/22/17 2334  NA 137  K 4.5  CL 106  CO2 19*  GLUCOSE 145*  BUN 42*  CREATININE 2.08*  CALCIUM 9.5  MG 1.5*  AST 26  ALT 23  ALKPHOS 128*  BILITOT 1.6*   ------------------------------------------------------------------------------------------------------------------ No results for  input(s): CHOL, HDL, LDLCALC, TRIG, CHOLHDL, LDLDIRECT in the last 72 hours.  Lab Results  Component Value Date   HGBA1C 5.5 06/15/2016    ------------------------------------------------------------------------------------------------------------------ No results for input(s): TSH, T4TOTAL, T3FREE, THYROIDAB in the last 72 hours.  Invalid input(s): FREET3 ------------------------------------------------------------------------------------------------------------------ No results for input(s): VITAMINB12, FOLATE, FERRITIN, TIBC, IRON, RETICCTPCT in the last 72 hours.  Coagulation profile No results for input(s): INR, PROTIME in the last 168 hours.  No results for input(s): DDIMER in the last 72 hours.  Cardiac Enzymes Recent Labs  Lab 11/22/17 2334 11/23/17 0248 11/23/17 0948  TROPONINI 0.11* 0.08* 0.09*   ------------------------------------------------------------------------------------------------------------------    Component Value Date/Time   BNP >4,500.0 (H) 11/22/2017 2334    Inpatient Medications  Scheduled Meds: . aspirin EC  81 mg Oral Daily  . heparin  5,000 Units Subcutaneous Q8H  . insulin aspart  0-5 Units Subcutaneous QHS  . insulin aspart  0-9 Units Subcutaneous TID WC  . metoprolol succinate  12.5 mg Oral Daily  . sodium chloride flush  3 mL Intravenous Q12H   Continuous Infusions: . sodium chloride    . amiodarone    . amiodarone    . magnesium sulfate 1 - 4 g bolus IVPB     PRN Meds:.sodium chloride, acetaminophen **OR** acetaminophen, hydrocortisone, hydrocortisone cream, hydrOXYzine, ondansetron **OR** ondansetron (ZOFRAN) IV, sodium chloride flush  Micro Results No results found for this or any previous visit (from the past 240 hour(s)).  Radiology Reports Dg Chest 2 View  Result Date: 11/22/2017 CLINICAL DATA:  84 year old female with shortness of breath. EXAM: CHEST  2 VIEW COMPARISON:  Chest radiograph dated 11/01/2017 FINDINGS: Small left pleural effusion similar or slightly increased compared to the prior radiograph with associated compressive atelectasis of the left  lung base. Superimposed pneumonia or underlying mass is not excluded. Clinical correlation is recommended. The right lung is clear. There is no pneumothorax. Stable cardiomegaly. Atherosclerotic calcification of the aortic arch. No acute osseous pathology. Osteopenia and degenerative changes of the spine. A pin is noted in the soft tissues of the left axilla. IMPRESSION: Small left pleural effusion similar or slightly increased since the prior radiograph. Clinical correlation and continued follow-up recommended. Cardiomegaly. Pin in the soft tissues of the left axilla and posterior upper arm. This may be related to overlying dressing. Clinical correlation is recommended. Electronically Signed   By: Elgie Collard M.D.   On: 11/22/2017 22:28   Dg Chest 2 View  Result Date: 11/01/2017 CLINICAL DATA:  Chest pain.  Shortness of breath. EXAM: CHEST  2 VIEW COMPARISON:  October 29, 2017 FINDINGS: Small left effusion and associated infiltrate in the left base. No other interval changes or acute abnormalities. IMPRESSION: Small left effusion and associated infiltrate in the lateral left lung base. Recommend follow-up to resolution. Electronically Signed   By: Gerome Sam III M.D   On: 11/01/2017 18:54   Dg Chest 2 View  Result Date: 10/29/2017 CLINICAL DATA:  Per ED notes: Patient released from hospital on Wednesday , admit for CHF. Patient reports SOB and chest pain. No chest pain currently EXAM: CHEST  2 VIEW COMPARISON:  10/16/2017 FINDINGS: The heart is enlarged. There is mild interstitial pulmonary edema. Bilateral pleural effusions, left greater than right. There has been some improvement in the appearance of airspace filling opacities and consolidation in the left lower lobe. IMPRESSION: 1. Improved appearance of left lower lobe consolidation. 2. Cardiomegaly and mild edema persist. 3. Bilateral pleural effusions left greater than right. Electronically Signed  By: Norva Pavlov M.D.   On: 10/29/2017  20:56    Time Spent in minutes  25   Hannan Hutmacher M.D on 11/23/2017 at 2:19 PM  Between 7am to 7pm - Pager - [phone]  After 7pm go to [email or web address] - password [facility]  [facility] -  Office  [phone]

## 2017-11-23 NOTE — ED Notes (Signed)
Date and time results received: 11/23/17 0102   Test: BNP Critical Value: >4500  Name of Provider Notified: Devoria Albe MD

## 2017-11-23 NOTE — Care Management Obs Status (Signed)
MEDICARE OBSERVATION STATUS NOTIFICATION   Patient Details  Name: [name] [name] MRN: [id number] Date of Birth: August 24, 1924   Medicare Observation Status Notification Given:  Yes    Dawnn Nam, [name], RN 11/23/2017, 11:31 AM

## 2017-11-24 ENCOUNTER — Inpatient Hospital Stay (HOSPITAL_COMMUNITY): Payer: Medicare Other

## 2017-11-24 DIAGNOSIS — E43 Unspecified severe protein-calorie malnutrition: Secondary | ICD-10-CM

## 2017-11-24 DIAGNOSIS — I959 Hypotension, unspecified: Secondary | ICD-10-CM

## 2017-11-24 DIAGNOSIS — I132 Hypertensive heart and chronic kidney disease with heart failure and with stage 5 chronic kidney disease, or end stage renal disease: Secondary | ICD-10-CM

## 2017-11-24 DIAGNOSIS — N170 Acute kidney failure with tubular necrosis: Secondary | ICD-10-CM

## 2017-11-24 LAB — CBC
HCT: 38.3 % (ref 36.0–46.0)
Hemoglobin: 12.1 g/dL (ref 12.0–15.0)
MCH: 26.9 pg (ref 26.0–34.0)
MCHC: 31.6 g/dL (ref 30.0–36.0)
MCV: 85.1 fL (ref 78.0–100.0)
PLATELETS: 186 10*3/uL (ref 150–400)
RBC: 4.5 MIL/uL (ref 3.87–5.11)
RDW: 14.3 % (ref 11.5–15.5)
WBC: 14.8 10*3/uL — AB (ref 4.0–10.5)

## 2017-11-24 LAB — BASIC METABOLIC PANEL
Anion gap: 17 — ABNORMAL HIGH (ref 5–15)
BUN: 55 mg/dL — AB (ref 6–20)
CALCIUM: 9.5 mg/dL (ref 8.9–10.3)
CO2: 17 mmol/L — ABNORMAL LOW (ref 22–32)
CREATININE: 2.86 mg/dL — AB (ref 0.44–1.00)
Chloride: 101 mmol/L (ref 101–111)
GFR, EST AFRICAN AMERICAN: 16 mL/min — AB (ref >60)
GFR, EST NON AFRICAN AMERICAN: 13 mL/min — AB (ref >60)
Glucose, Bld: 134 mg/dL — ABNORMAL HIGH (ref 65–99)
Potassium: 5.6 mmol/L — ABNORMAL HIGH (ref 3.5–5.1)
Sodium: 135 mmol/L (ref 135–145)

## 2017-11-24 LAB — GLUCOSE, CAPILLARY
GLUCOSE-CAPILLARY: 131 mg/dL — AB (ref 65–99)
GLUCOSE-CAPILLARY: 112 mg/dL — AB (ref 65–99)
Glucose-Capillary: 93 mg/dL (ref 65–99)
Glucose-Capillary: 85 mg/dL (ref 65–99)

## 2017-11-24 LAB — CREATININE, URINE, RANDOM: Creatinine, Urine: 116.14 mg/dL

## 2017-11-24 LAB — LACTIC ACID, PLASMA: Lactic Acid, Venous: 2.4 mmol/L (ref 0.5–1.9)

## 2017-11-24 LAB — SODIUM, URINE, RANDOM: Sodium, Ur: 58 mmol/L

## 2017-11-24 MED ORDER — SODIUM CHLORIDE 0.45 % IV SOLN: INTRAVENOUS | Status: DC

## 2017-11-24 MED ORDER — SODIUM CHLORIDE 0.9 % IV SOLN: 250.0000 mL | INTRAVENOUS | Status: DC | PRN

## 2017-11-24 MED ORDER — FUROSEMIDE 10 MG/ML IJ SOLN
40.0000 mg | Freq: Once | INTRAMUSCULAR | Status: AC
  Administered 2017-11-24: 40 mg via INTRAVENOUS
  Filled 2017-11-24: qty 4

## 2017-11-24 NOTE — Progress Notes (Signed)
Initial Nutrition Assessment  DOCUMENTATION CODES:   Severe malnutrition in context of chronic illness, Underweight  INTERVENTION:  Recommend liberalize diet to regular in order to allow pt to eat ad lib without therapeutic restriction   NUTRITION DIAGNOSIS:   Severe Malnutrition(CHF-(EF-15-30%- 5 months ago), CKD-4, Cardiomyopathy) related to chronic illness as evidenced by severe muscle depletion, moderate fat depletion, severe fat depletion, mild muscle depletion, moderate muscle depletion, percent weight loss (12% in 6 months).   GOAL:  Meet est nutrition needs as able given pt chronic disease burden, advanced age and goals of care.   MONITOR:   PO intake, Labs, Weight trends  REASON FOR ASSESSMENT:   Consult Assessment of nutrition requirement/status  ASSESSMENT:  Patient is a 84 yo female from home with hx of HTN, CKD-IV, HF with EF-15-20% (06/2017), CAD, DM-2, HLD and non-ischemic cardiomyopathy.  She follows a regular diet at home. "Eats when she wants to" and adamantly refuses to drink any type of oral nutrition. Her breakfast tray is here and she has consumed 50% of coffee, 0% milk or juice and bites of eggs and bites of pancakes. Pt is currently on a 1.2 liter fluid restriction.   At home she reports to ambulates independently, prepares her own food and actively enjoys crafting.   Significant weight loss over the past 6 months 12%. She has moderate to severe muscle and fat loss in multiple regions (assessment below) in addition to unplanned wt loss of > 10% in 6 months. Meets criteria for severe chronic malnutrition.   Per MD goals of care have been discussed.     BMP Latest Ref Rng & Units 11/24/2017 11/23/2017 11/22/2017  Glucose 65 - 99 mg/dL 811(B) 147(W) 295(A)  BUN 6 - 20 mg/dL 21(H) 08(M) 57(Q)  Creatinine 0.44 - 1.00 mg/dL 4.69(G) 2.95(M) 8.41(L)  Sodium 135 - 145 mmol/L 135 133(L) 137  Potassium 3.5 - 5.1 mmol/L 5.6(H) 4.8 4.5  Chloride 101 - 111 mmol/L  101 102 106  CO2 22 - 32 mmol/L 17(L) 18(L) 19(L)  Calcium 8.9 - 10.3 mg/dL 9.5 9.4 9.5    NUTRITION - FOCUSED PHYSICAL EXAM:    Most Recent Value  Orbital Region  Moderate depletion  Upper Arm Region  Moderate depletion  Thoracic and Lumbar Region  Severe depletion  Buccal Region  Moderate depletion  Temple Region  Moderate depletion  Clavicle Bone Region  Severe depletion  Clavicle and Acromion Bone Region  Severe depletion  Scapular Bone Region  Severe depletion  Dorsal Hand  Moderate depletion  Patellar Region  Mild depletion  Anterior Thigh Region  Mild depletion  Posterior Calf Region  Mild depletion  Edema (RD Assessment)  None  Hair  Reviewed  Eyes  Reviewed  Mouth  Reviewed  Skin  Reviewed  Nails  Reviewed      Diet Order:  Diet heart healthy/carb modified Room service appropriate? Yes; Fluid consistency: Thin; Fluid restriction: 1200 mL Fluid  EDUCATION NEEDS:   No education needs have been identified at this time  Scheduled Meds: . aspirin EC  81 mg Oral Daily  . heparin  5,000 Units Subcutaneous Q8H  . insulin aspart  0-5 Units Subcutaneous QHS  . insulin aspart  0-9 Units Subcutaneous TID WC  . metoprolol succinate  12.5 mg Oral Daily  . sodium chloride flush  3 mL Intravenous Q12H   Continuous Infusions: . sodium chloride    . amiodarone Stopped (11/23/17 2040)   PRN Meds:.sodium chloride, acetaminophen **OR** acetaminophen, hydrocortisone, hydrocortisone  cream, hydrOXYzine, ondansetron **OR** ondansetron (ZOFRAN) IV, sodium chloride flush, traMADol-acetaminophen   Skin:  Skin Assessment: Reviewed RN Assessment  Last BM:  3/1 smear type 1  Height:   Ht Readings from Last 1 Encounters:  11/23/17 5\' 3"  (1.6 m)    Weight:   Wt Readings from Last 1 Encounters:  11/24/17 102 lb 1.2 oz (46.3 kg)    Ideal Body Weight:  52 kg  BMI:  Body mass index is 18.08 kg/m.  Estimated Nutritional Needs:   Kcal:  1380-1610  Protein:  37-40  gr  Fluid:  1.4-1.6 liters daily   Royann Shivers MS,RD,CSG,LDN Office: [phone] Pager: [phone]

## 2017-11-24 NOTE — Progress Notes (Signed)
PROGRESS NOTE                                                                                                                                                                                                             Patient Demographics:    [name] [name], is a 84 y.o. female, DOB - 12-Aug-1924, ZOX:[id number]  Admit date - 11/22/2017   Admitting Physician Pratik Hoover Brunette, DO  Outpatient Primary MD for the patient is Kirstie Peri, MD  LOS - 1  Outpatient Specialists:  Dr. Wyline Mood  Chief Complaint  Patient presents with  . Shortness of Breath       Brief Narrative   84 year old female with diffuse nonobstructive CAD, chronic combined systolic and diastolic CHF (last echo in 06/2017 with EF of 15-20%), LBBB, moderate mitral regurgitation, hypertension, history of CVA in 2017, hyperlipidemia, chronic kidney disease stage III (baseline creatinine around 1.4) who was hospitalized both in January and February this month with acute on chronic CHF and her Lasix dose increased during last hospitalization.  (40 mg daily).  Prior to that she was hospitalized in October 2018 with atypical chest pain, narrow complex tachycardia and CHF.  During her hospitalization in January she also had acute on chronic kidney disease and was seen by nephrology. She presented to the ED on 2/27 with palpitation and shortness of breath with finding of acute on chronic combined systolic and diastolic CHF (BNP greater than 4500, mildly elevated troponin and chest x-ray with increased left pleural effusion. She was also found to have acute on chronic kidney disease with creatinine of 2.08. Patient given a dose of IV Lasix and admitted to stepdown unit.      Subjective:   Was started on amiodarone drip without bolus for paroxysmal tachycardia but was discontinued due to hypotension.  Blood pressure has remained stable this morning and patient reports her  dyspnea to be okay.   Assessment  & Plan :    Principal Problem:   Acute on chronic combined systolic and diastolic CHF (congestive heart failure) (HCC) Recurrent hospitalizations (almost 6 in the past 5 months) with same problem. Has significantly low EF (15-20%), along with LBBB, MR and now with acute kidney injury. Received a dose of IV Lasix in the ED but unable to diurese further due to worsening renal function and low blood pressure. -  Poor overall prognosis and patient not a candidate for any further cardiac due to her advanced age, intervention advanced CHF, hypotension and acute kidney injury.  After detailed discussion with patient and her healthcare power of attorney she was made DNR with goal for optimizing medical management as tolerated and transition to comfort measures if symptoms deteriorate.   Cardiology consult appreciated.   Acute on chronic kidney disease stage III Likely cardiorenal with advanced CHF.  Nephrology consulted.  Holding further diuresis.  ?Paroxysmal atrial tachycardia Started on amiodarone without bolus by cardiology on 2/28 but was discontinued due to hypotension.  Heart rate currently stable.  Electrolytes replenished.  Adjust beta-blocker dose as blood pressure tolerated.  Coronary artery disease Nonobstructive CAD in 2004.  Troponin mildly elevated but flat.  No further intervention.  Moderate MR/TR Not a candidate for further intervention.  Type 2 diabetes mellitus Monitor on sliding scale coverage.   Severe protein calorie malnutrition Nutrition consult appreciated  Scabies Ordered permethrin cream.  Social worker aware to evaluate her home situation and will contact her home health agency to look into it.  Goals of care Overall prognosis is guarded.  As per detailed discussion with patient and her POA on 2/28 we will try to optimize her medication with plan on her going home with palliative care follow-up.  If symptoms worsen the goal is  to transition her to comfort.  CODE STATUS: DNR  Family Communication  : Discussed with healthcare power of attorney  Disposition Plan  : Pending hospital course, possibly home in the next 48 hours (patient refused SNF or ALF)  Barriers For Discharge : Active symptoms  Consults  : Cardiology  Procedures  : None  DVT Prophylaxis  :  Lovenox -   Lab Results  Component Value Date   PLT 186 11/24/2017    Antibiotics  :    Anti-infectives (From admission, onward)   None        Objective:   Vitals:   11/24/17 0400 11/24/17 0500 11/24/17 0600 11/24/17 0700  BP: 117/73 116/61 129/74   Pulse: 98 65 66   Resp: 15 14 (!) 23   Temp:    (!) 97.4 F (36.3 C)  TempSrc:    Axillary  SpO2: 99% 93% (!) 88%   Weight:  46.3 kg (102 lb 1.2 oz)    Height:        Wt Readings from Last 3 Encounters:  11/24/17 46.3 kg (102 lb 1.2 oz)  11/16/17 48.5 kg (107 lb)  11/13/17 48.5 kg (107 lb)     Intake/Output Summary (Last 24 hours) at 11/24/2017 1145 Last data filed at 11/23/2017 2151 Gross per 24 hour  Intake 113.89 ml  Output -  Net 113.89 ml     Physical Exam General: Elderly pleasant cachectic female appears fatigued, not in distress HEENT: Moist mucosa, supple neck Chest: Improved bibasilar breath sound CVS: S1 and S2 regular, systolic murmur 3/6 GI: Soft, nondistended, nontender Musculoskeletal: Warm, no edema      Data Review:    CBC Recent Labs  Lab 11/23/17 0115 11/24/17 0550  WBC 5.9 14.8*  HGB 11.2* 12.1  HCT 35.4* 38.3  PLT 179 186  MCV 86.1 85.1  MCH 27.3 26.9  MCHC 31.6 31.6  RDW 14.1 14.3  LYMPHSABS 1.1  --   MONOABS 0.6  --   EOSABS 0.0  --   BASOSABS 0.0  --     Chemistries  Recent Labs  Lab 11/22/17 2334 11/23/17 1507  11/24/17 0550  NA 137 133* 135  K 4.5 4.8 5.6*  CL 106 102 101  CO2 19* 18* 17*  GLUCOSE 145* 147* 134*  BUN 42* 46* 55*  CREATININE 2.08* 2.38* 2.86*  CALCIUM 9.5 9.4 9.5  MG 1.5*  --   --   AST 26  --   --     ALT 23  --   --   ALKPHOS 128*  --   --   BILITOT 1.6*  --   --    ------------------------------------------------------------------------------------------------------------------ No results for input(s): CHOL, HDL, LDLCALC, TRIG, CHOLHDL, LDLDIRECT in the last 72 hours.  Lab Results  Component Value Date   HGBA1C 5.5 06/15/2016   ------------------------------------------------------------------------------------------------------------------ Recent Labs    11/23/17 1507  TSH 2.860   ------------------------------------------------------------------------------------------------------------------ No results for input(s): VITAMINB12, FOLATE, FERRITIN, TIBC, IRON, RETICCTPCT in the last 72 hours.  Coagulation profile No results for input(s): INR, PROTIME in the last 168 hours.  No results for input(s): DDIMER in the last 72 hours.  Cardiac Enzymes Recent Labs  Lab 11/23/17 0248 11/23/17 0948 11/23/17 1507  TROPONINI 0.08* 0.09* 0.09*   ------------------------------------------------------------------------------------------------------------------    Component Value Date/Time   BNP >4,500.0 (H) 11/22/2017 2334    Inpatient Medications  Scheduled Meds: . aspirin EC  81 mg Oral Daily  . heparin  5,000 Units Subcutaneous Q8H  . insulin aspart  0-5 Units Subcutaneous QHS  . insulin aspart  0-9 Units Subcutaneous TID WC  . metoprolol succinate  12.5 mg Oral Daily  . sodium chloride flush  3 mL Intravenous Q12H   Continuous Infusions: . sodium chloride    . amiodarone Stopped (11/23/17 2040)   PRN Meds:.sodium chloride, acetaminophen **OR** acetaminophen, hydrocortisone, hydrocortisone cream, hydrOXYzine, ondansetron **OR** ondansetron (ZOFRAN) IV, sodium chloride flush, traMADol-acetaminophen  Micro Results Recent Results (from the past 240 hour(s))  MRSA PCR Screening     Status: None   Collection Time: 11/23/17  6:21 AM  Result Value Ref Range Status    MRSA by PCR NEGATIVE NEGATIVE Final    Comment:        The GeneXpert MRSA Assay (FDA approved for NASAL specimens only), is one component of a comprehensive MRSA colonization surveillance program. It is not intended to diagnose MRSA infection nor to guide or monitor treatment for MRSA infections. Performed at [facility], [address], [place], [place] [address]     Radiology Reports Dg Chest 2 View  Result Date: 11/22/2017 CLINICAL DATA:  84 year old female with shortness of breath. EXAM: CHEST  2 VIEW COMPARISON:  Chest radiograph dated 11/01/2017 FINDINGS: Small left pleural effusion similar or slightly increased compared to the prior radiograph with associated compressive atelectasis of the left lung base. Superimposed pneumonia or underlying mass is not excluded. Clinical correlation is recommended. The right lung is clear. There is no pneumothorax. Stable cardiomegaly. Atherosclerotic calcification of the aortic arch. No acute osseous pathology. Osteopenia and degenerative changes of the spine. A pin is noted in the soft tissues of the left axilla. IMPRESSION: Small left pleural effusion similar or slightly increased since the prior radiograph. Clinical correlation and continued follow-up recommended. Cardiomegaly. Pin in the soft tissues of the left axilla and posterior upper arm. This may be related to overlying dressing. Clinical correlation is recommended. Electronically Signed   By: Elgie Collard M.D.   On: 11/22/2017 22:28   Dg Chest 2 View  Result Date: 11/01/2017 CLINICAL DATA:  Chest pain.  Shortness of breath. EXAM: CHEST  2 VIEW  COMPARISON:  October 29, 2017 FINDINGS: Small left effusion and associated infiltrate in the left base. No other interval changes or acute abnormalities. IMPRESSION: Small left effusion and associated infiltrate in the lateral left lung base. Recommend follow-up to resolution. Electronically Signed   By: Gerome Sam III M.D   On: 11/01/2017  18:54   Dg Chest 2 View  Result Date: 10/29/2017 CLINICAL DATA:  Per ED notes: Patient released from hospital on Wednesday , admit for CHF. Patient reports SOB and chest pain. No chest pain currently EXAM: CHEST  2 VIEW COMPARISON:  10/16/2017 FINDINGS: The heart is enlarged. There is mild interstitial pulmonary edema. Bilateral pleural effusions, left greater than right. There has been some improvement in the appearance of airspace filling opacities and consolidation in the left lower lobe. IMPRESSION: 1. Improved appearance of left lower lobe consolidation. 2. Cardiomegaly and mild edema persist. 3. Bilateral pleural effusions left greater than right. Electronically Signed   By: Norva Pavlov M.D.   On: 10/29/2017 20:56    Time Spent in minutes  25   Charlize Hathaway M.D on 11/24/2017 at 11:45 AM  Between 7am to 7pm - Pager - [phone]  After 7pm go to [email or web address] - password [facility]  [facility] -  Office  [phone]

## 2017-11-24 NOTE — Consult Note (Addendum)
[name] [name] MRN: [id number] DOB/AGE: 1924-07-10 84 y.o. Primary Care Physician:Shah, Beatrix Fetters, MD Admit date: 11/22/2017 Chief Complaint:  Chief Complaint  Patient presents with  . Shortness of Breath   HPI: Pt is a 84 year old  female who presented to  emergency department with c/o of shortness of breath. HPI dates back to yesterday when pt started having papitation. Pt said- "my heart was beating wrong". Pt also started feeling short of breath yesterday.   Pt offer no c/o  chest pain. NO c/o  diaphoresis.   Pt also c/o  nausea with it but no vomiting.   No c/o  or change in her appetite.  No diarrhea or constipation.   No c/o fever/cough/chills  No c/o syncope  Upon evaluation in ER pt was admitted with acute on chronic CHF In recent past patient has also been seen in the ER for pruritus on February 21 in February 23 and was diagnosed with scabies. Pt had not started the  Treatment for scabies yet, nw pt is on the tx.   Past Medical History:  Diagnosis Date  . Arthritis   . Chronic combined systolic and diastolic CHF (congestive heart failure) (HCC)   . CKD (chronic kidney disease), stage IV (HCC)   . Coronary artery disease    Cath 2004--60% LAD--diffuse and calcified, 40% PDA  . Depression   . Hyperlipidemia   . Hypertension   . LBBB (left bundle branch block)   . Mitral regurgitation   . Nonischemic cardiomyopathy (HCC)    a. EF 30% 2004 b. 10/2006 EF=60-65% c. 06/2017: EF 15-20%, no regional WMA, Grade 1 DD, moderate MR  . Overactive bladder   . Pulmonary embolism (HCC)    status post  . Stroke (cerebrum) (HCC)   . SVT (supraventricular tachycardia) (HCC)    a. 32 beat run narrow complex rhythm in 06/2017.  . Type II diabetes mellitus (HCC)         Family History  Problem Relation Age of Onset  . Stroke Mother        deceased    Social History:  reports that  has never smoked. she has never used smokeless tobacco. She reports that she does not drink  alcohol or use drugs.   Allergies: No Known Allergies  Medications Prior to Admission  Medication Sig Dispense Refill  . aspirin EC 81 MG EC tablet Take 1 tablet (81 mg total) by mouth daily. 30 tablet 0  . furosemide (LASIX) 40 MG tablet Take 1 tablet (40 mg total) by mouth daily. (Patient taking differently: Take 20 mg by mouth daily. ) 90 tablet 3  . hydrocortisone (ANUSOL-HC) 2.5 % rectal cream Apply 1 application topically 2 (two) times daily as needed for hemorrhoids. 30 g 0  . hydrocortisone cream 1 % Apply 1 application topically 3 (three) times daily as needed for itching (minor skin irritation). 30 g 0  . hydrOXYzine (VISTARIL) 25 MG capsule 1 at hs for itching. (Patient taking differently: Take 25 mg by mouth every 8 (eight) hours as needed for itching. 1 at hs for itching.) 15 capsule 0  . metoprolol succinate (TOPROL-XL) 25 MG 24 hr tablet Take 0.5 tablets (12.5 mg total) by mouth daily. 45 tablet 3  . permethrin (ELIMITE) 5 % cream Apply to affected area once from head to toe especially in areas that are itching.  Keep on overnight.  Then rinse off. (Patient not taking: Reported on 11/23/2017) 60 g 1  UEA:VWUJW from the symptoms mentioned above,there are no other symptoms referable to all systems reviewed.  Marland Kitchen aspirin EC  81 mg Oral Daily  . heparin  5,000 Units Subcutaneous Q8H  . insulin aspart  0-5 Units Subcutaneous QHS  . insulin aspart  0-9 Units Subcutaneous TID WC  . metoprolol succinate  12.5 mg Oral Daily  . sodium chloride flush  3 mL Intravenous Q12H       Physical Exam: Vital signs in last 24 hours: Temp:  [97.4 F (36.3 C)-98.9 F (37.2 C)] 97.4 F (36.3 C) (03/01 0700) Pulse Rate:  [59-114] 66 (03/01 0600) Resp:  [14-26] 23 (03/01 0600) BP: (76-129)/(48-81) 129/74 (03/01 0600) SpO2:  [75 %-100 %] 88 % (03/01 0600) Weight:  [102 lb 1.2 oz (46.3 kg)] 102 lb 1.2 oz (46.3 kg) (03/01 0500) Weight change: -13.2 oz (-1.736 kg) Last BM Date:  11/22/17(per pt)  Intake/Output from previous day: 02/28 0701 - 03/01 0700 In: 113.9 [I.V.:13.9; IV Piggyback:100] Out: -  No intake/output data recorded.   Physical Exam: General- pt is awake,alert, follows commands Resp- No acute REsp distress,  NO Rhonchi CVS- S1S2 regular in rate and rhythm GIT- BS+, soft, NT, ND EXT- NO LE Edema, No Cyanosis CNS- CN 2-12 grossly intact. Moving all 4 extremities Psych- normal mood and affect    Lab Results: CBC Recent Labs    11/23/17 0115 11/24/17 0550  WBC 5.9 14.8*  HGB 11.2* 12.1  HCT 35.4* 38.3  PLT 179 186    BMET Recent Labs    11/23/17 1507 11/24/17 0550  NA 133* 135  K 4.8 5.6*  CL 102 101  CO2 18* 17*  GLUCOSE 147* 134*  BUN 46* 55*  CREATININE 2.38* 2.86*  CALCIUM 9.4 9.5    Creat trend 2019    2.0=> 2.3=> 2.8     1.69--1.96 2018   1.1--1.5 2017    1.46 2009     1.29   Anion gap 135-118=17  Delta AG 17-9=8  Delta Bicarb 24-17=7  MICRO Recent Results (from the past 240 hour(s))  MRSA PCR Screening     Status: None   Collection Time: 11/23/17  6:21 AM  Result Value Ref Range Status   MRSA by PCR NEGATIVE NEGATIVE Final    Comment:        The GeneXpert MRSA Assay (FDA approved for NASAL specimens only), is one component of a comprehensive MRSA colonization surveillance program. It is not intended to diagnose MRSA infection nor to guide or monitor treatment for MRSA infections. Performed at [facility], [address], [place], [place] [address]       Lab Results  Component Value Date   CALCIUM 9.5 11/24/2017   CAION 1.22 02/08/2012   PHOS 3.4 10/18/2017      Impression: 1)Renal  AKI secondary to Post renal/Hypotension/ Cardiorenal                AKI on CKD               CKD stage 4.               CKD since 2009               CKD secondary to low glomerular mass ( single functioning kidney/Age asso decline)                Progression of CKD slow  Proteinura  absent                Hematuria none.                Nephrolithiasis Hx Absent                 Autoimmune work up done                [employer] negative -1.21.19               24 hr urine showed proteinuria of only 47 milligrams                Spep negativ                  Worsening AKI                 Oliguric                 I requested RN to please scan the bladder, pt had              2)HTN  Medication- On Diuretics  3)Anemia HGb at goal (9--11)   4)CKD Mineral-Bone Disorder  Phosphorus at goal. Calcium is at goal. VItamin D deficincy present  5)CHF-admited with CHF PMD following  6)Electrolytes  hyperkalemic   NOrmonatremic  7)Acid base Co2 not  at goal     Plan:  Will ask for foley Will ask for CXR Will start IVF at low rate Will give diuretics as pt is hyperkalemic Will follow bmet Will follow I/o Will ask for lactate     Brittan Butterbaugh S 11/24/2017, 1:31 PM

## 2017-11-25 LAB — CBC
HCT: 37.6 % (ref 36.0–46.0)
Hemoglobin: 12 g/dL (ref 12.0–15.0)
MCH: 27.3 pg (ref 26.0–34.0)
MCHC: 31.9 g/dL (ref 30.0–36.0)
MCV: 85.6 fL (ref 78.0–100.0)
Platelets: 241 10*3/uL (ref 150–400)
RBC: 4.39 MIL/uL (ref 3.87–5.11)
RDW: 14.2 % (ref 11.5–15.5)
WBC: 9.1 10*3/uL (ref 4.0–10.5)

## 2017-11-25 LAB — GLUCOSE, CAPILLARY
GLUCOSE-CAPILLARY: 124 mg/dL — AB (ref 65–99)
Glucose-Capillary: 125 mg/dL — ABNORMAL HIGH (ref 65–99)

## 2017-11-25 LAB — BASIC METABOLIC PANEL
Anion gap: 16 — ABNORMAL HIGH (ref 5–15)
BUN: 64 mg/dL — ABNORMAL HIGH (ref 6–20)
CO2: 17 mmol/L — ABNORMAL LOW (ref 22–32)
Calcium: 9.2 mg/dL (ref 8.9–10.3)
Chloride: 101 mmol/L (ref 101–111)
Creatinine, Ser: 3.22 mg/dL — ABNORMAL HIGH (ref 0.44–1.00)
GFR calc Af Amer: 13 mL/min — ABNORMAL LOW (ref >60)
GFR calc non Af Amer: 12 mL/min — ABNORMAL LOW (ref >60)
Glucose, Bld: 100 mg/dL — ABNORMAL HIGH (ref 65–99)
Potassium: 5.2 mmol/L — ABNORMAL HIGH (ref 3.5–5.1)
Sodium: 134 mmol/L — ABNORMAL LOW (ref 135–145)

## 2017-11-25 LAB — PHOSPHORUS: Phosphorus: 5.2 mg/dL — ABNORMAL HIGH (ref 2.5–4.6)

## 2017-11-25 MED ORDER — SODIUM CHLORIDE 0.9 % IV SOLN: 250.0000 mL | INTRAVENOUS | Status: DC | PRN

## 2017-11-25 MED ORDER — FUROSEMIDE 10 MG/ML IJ SOLN
80.0000 mg | Freq: Two times a day (BID) | INTRAMUSCULAR | Status: DC
  Administered 2017-11-25 – 2017-11-26 (×3): 80 mg via INTRAVENOUS
  Filled 2017-11-25 (×4): qty 8

## 2017-11-25 MED ORDER — SODIUM BICARBONATE 650 MG PO TABS
650.0000 mg | ORAL_TABLET | Freq: Two times a day (BID) | ORAL | Status: DC
  Administered 2017-11-25 – 2017-11-26 (×3): 650 mg via ORAL
  Filled 2017-11-25 (×3): qty 1

## 2017-11-25 NOTE — Progress Notes (Signed)
PROGRESS NOTE                                                                                                                                                                                                             Patient Demographics:    Basil Blakesley, is a 84 y.o. female, DOB - 1924-04-02, OZH:[id number]  Admit date - 11/22/2017   Admitting Physician Pratik Hoover Brunette, DO  Outpatient Primary MD for the patient is Kirstie Peri, MD  LOS - 2  Outpatient Specialists:  Dr. Wyline Mood  Chief Complaint  Patient presents with  . Shortness of Breath       Brief Narrative   84 year old female with diffuse nonobstructive CAD, chronic combined systolic and diastolic CHF (last echo in 06/2017 with EF of 15-20%), LBBB, moderate mitral regurgitation, hypertension, history of CVA in 2017, hyperlipidemia, chronic kidney disease stage III (baseline creatinine around 1.4) who was hospitalized both in January and February this month with acute on chronic CHF and her Lasix dose increased during last hospitalization.  (40 mg daily).  Prior to that she was hospitalized in October 2018 with atypical chest pain, narrow complex tachycardia and CHF.  During her hospitalization in January she also had acute on chronic kidney disease and was seen by nephrology. She presented to the ED on 2/27 with palpitation and shortness of breath with finding of acute on chronic combined systolic and diastolic CHF (BNP greater than 4500, mildly elevated troponin and chest x-ray with increased left pleural effusion. She was also found to have acute on chronic kidney disease with creatinine of 2.08. Patient given a dose of IV Lasix and admitted to stepdown unit.      Subjective:   Feels breathing to be okay. She started crying after I told her that her kidney function was worse and if it does not improve or continues to worsen despite fluids and lasix , we donot have  much option to get her better..   Assessment  & Plan :    Principal Problem:   Acute on chronic combined systolic and diastolic CHF (congestive heart failure) (HCC) Recurrent hospitalizations (almost 6 in the past 5 months) with same problem. Has significantly low EF (15-20%), along with LBBB, MR and now with acute kidney injury. attempting diuresis with IV lasix.  continue low dose BB. Overall prognosis is  guarded.   Cardiology consult appreciated.   Acute on chronic kidney disease stage III Prerenal vs cardiorenal with advanced CHF. Worsened renal fn with oliguria. Renal consult appreciated. IV fluids and IV lasix.x monitor strict I/O. Foley placed.   ?Paroxysmal atrial tachycardia Started on amiodarone without bolus by cardiology on 2/28 but was discontinued due to hypotension.  Heart rate currently stable.  Electrolytes replenished.  Adjust beta-blocker dose as blood pressure tolerated.  Coronary artery disease Nonobstructive CAD in 2004.  Troponin mildly elevated but flat.  No further intervention.  Moderate MR/TR Not a candidate for further intervention.  Type 2 diabetes mellitus Monitor on sliding scale coverage.  hyperkalemia  monitor with diuresis  Severe protein calorie malnutrition Nutrition consult appreciated  Scabies Ordered permethrin cream.  Social worker aware to evaluate her home situation and will contact her home health agency to look into it.  Goals of care Overall prognosis is guarded.  As per detailed discussion with patient and her POA on 2/28 we will try to optimize her medication with plan on her going home with palliative care follow-up.  If symptoms worsen the goal is to transition her to comfort.  CODE STATUS: DNR  Family Communication  : Discussed with healthcare power of attorney on 2/28  Disposition Plan  : Pending hospital course,   Barriers For Discharge : Active symptoms  Consults  : Cardiology, renal  Procedures  : None  DVT  Prophylaxis  :  Lovenox -   Lab Results  Component Value Date   PLT 241 11/25/2017    Antibiotics  :    Anti-infectives (From admission, onward)   None        Objective:   Vitals:   11/25/17 0544 11/25/17 0600 11/25/17 0700 11/25/17 0800  BP:  107/73 103/65 (!) 108/56  Pulse: 62 77 74 70  Resp: 18 20 (!) 21 18  Temp: 97.9 F (36.6 C)     TempSrc: Oral     SpO2: 94% (!) 87% (!) 86% 93%  Weight:      Height:        Wt Readings from Last 3 Encounters:  11/25/17 46.8 kg (103 lb 2.8 oz)  11/16/17 48.5 kg (107 lb)  11/13/17 48.5 kg (107 lb)     Intake/Output Summary (Last 24 hours) at 11/25/2017 1007 Last data filed at 11/25/2017 0438 Gross per 24 hour  Intake 120 ml  Output 400 ml  Net -280 ml     Physical Exam Gen: elderly cachetic female in NAD  HEENT: moist mucosa, supple neck  chest: diminished bibasilar breath sounds  CVS: NS1&S2, no murmurs GI: Soft, NT, ND Musculoskeletal: warm , no edema, foley +       Data Review:    CBC Recent Labs  Lab 11/23/17 0115 11/24/17 0550 11/25/17 0440  WBC 5.9 14.8* 9.1  HGB 11.2* 12.1 12.0  HCT 35.4* 38.3 37.6  PLT 179 186 241  MCV 86.1 85.1 85.6  MCH 27.3 26.9 27.3  MCHC 31.6 31.6 31.9  RDW 14.1 14.3 14.2  LYMPHSABS 1.1  --   --   MONOABS 0.6  --   --   EOSABS 0.0  --   --   BASOSABS 0.0  --   --     Chemistries  Recent Labs  Lab 11/22/17 2334 11/23/17 1507 11/24/17 0550 11/25/17 0440  NA 137 133* 135 134*  K 4.5 4.8 5.6* 5.2*  CL 106 102 101 101  CO2 19* 18* 17* 17*  GLUCOSE 145* 147* 134* 100*  BUN 42* 46* 55* 64*  CREATININE 2.08* 2.38* 2.86* 3.22*  CALCIUM 9.5 9.4 9.5 9.2  MG 1.5*  --   --   --   AST 26  --   --   --   ALT 23  --   --   --   ALKPHOS 128*  --   --   --   BILITOT 1.6*  --   --   --    ------------------------------------------------------------------------------------------------------------------ No results for input(s): CHOL, HDL, LDLCALC, TRIG, CHOLHDL, LDLDIRECT  in the last 72 hours.  Lab Results  Component Value Date   HGBA1C 5.5 06/15/2016   ------------------------------------------------------------------------------------------------------------------ Recent Labs    11/23/17 1507  TSH 2.860   ------------------------------------------------------------------------------------------------------------------ No results for input(s): VITAMINB12, FOLATE, FERRITIN, TIBC, IRON, RETICCTPCT in the last 72 hours.  Coagulation profile No results for input(s): INR, PROTIME in the last 168 hours.  No results for input(s): DDIMER in the last 72 hours.  Cardiac Enzymes Recent Labs  Lab 11/23/17 0248 11/23/17 0948 11/23/17 1507  TROPONINI 0.08* 0.09* 0.09*   ------------------------------------------------------------------------------------------------------------------    Component Value Date/Time   BNP >4,500.0 (H) 11/22/2017 2334    Inpatient Medications  Scheduled Meds: . aspirin EC  81 mg Oral Daily  . furosemide  80 mg Intravenous BID  . heparin  5,000 Units Subcutaneous Q8H  . insulin aspart  0-5 Units Subcutaneous QHS  . insulin aspart  0-9 Units Subcutaneous TID WC  . metoprolol succinate  12.5 mg Oral Daily  . sodium bicarbonate  650 mg Oral BID  . sodium chloride flush  3 mL Intravenous Q12H   Continuous Infusions: . sodium chloride    . amiodarone Stopped (11/23/17 2040)   PRN Meds:.sodium chloride, acetaminophen **OR** acetaminophen, hydrocortisone, hydrocortisone cream, hydrOXYzine, ondansetron **OR** ondansetron (ZOFRAN) IV, sodium chloride flush, traMADol-acetaminophen  Micro Results Recent Results (from the past 240 hour(s))  MRSA PCR Screening     Status: None   Collection Time: 11/23/17  6:21 AM  Result Value Ref Range Status   MRSA by PCR NEGATIVE NEGATIVE Final    Comment:        The GeneXpert MRSA Assay (FDA approved for NASAL specimens only), is one component of a comprehensive MRSA  colonization surveillance program. It is not intended to diagnose MRSA infection nor to guide or monitor treatment for MRSA infections. Performed at [facility], [address], [place], [place] [address]     Radiology Reports Dg Chest 1 View  Result Date: 11/24/2017 CLINICAL DATA:  Shortness of breath.  CHF. EXAM: CHEST 1 VIEW COMPARISON:  Chest x-ray 11/22/2017. FINDINGS: Cardiomegaly with normal pulmonary vascularity. Mild bilateral interstitial prominence and small left-sided pleural effusion. Findings suggest mild CHF. No acute bony abnormality. Carotid vascular calcification. IMPRESSION: 1. Congestive heart failure with mild bilateral pulmonary interstitial edema and small left pleural effusion. 2.  Carotid vascular disease. Electronically Signed   By: Maisie Fus  Register   On: 11/24/2017 15:22   Dg Chest 2 View  Result Date: 11/22/2017 CLINICAL DATA:  84 year old female with shortness of breath. EXAM: CHEST  2 VIEW COMPARISON:  Chest radiograph dated 11/01/2017 FINDINGS: Small left pleural effusion similar or slightly increased compared to the prior radiograph with associated compressive atelectasis of the left lung base. Superimposed pneumonia or underlying mass is not excluded. Clinical correlation is recommended. The right lung is clear. There is no pneumothorax. Stable cardiomegaly. Atherosclerotic calcification of the aortic arch. No acute osseous pathology. Osteopenia and  degenerative changes of the spine. A pin is noted in the soft tissues of the left axilla. IMPRESSION: Small left pleural effusion similar or slightly increased since the prior radiograph. Clinical correlation and continued follow-up recommended. Cardiomegaly. Pin in the soft tissues of the left axilla and posterior upper arm. This may be related to overlying dressing. Clinical correlation is recommended. Electronically Signed   By: Elgie Collard M.D.   On: 11/22/2017 22:28   Dg Chest 2 View  Result Date:  11/01/2017 CLINICAL DATA:  Chest pain.  Shortness of breath. EXAM: CHEST  2 VIEW COMPARISON:  October 29, 2017 FINDINGS: Small left effusion and associated infiltrate in the left base. No other interval changes or acute abnormalities. IMPRESSION: Small left effusion and associated infiltrate in the lateral left lung base. Recommend follow-up to resolution. Electronically Signed   By: Gerome Sam III M.D   On: 11/01/2017 18:54   Dg Chest 2 View  Result Date: 10/29/2017 CLINICAL DATA:  Per ED notes: Patient released from hospital on Wednesday , admit for CHF. Patient reports SOB and chest pain. No chest pain currently EXAM: CHEST  2 VIEW COMPARISON:  10/16/2017 FINDINGS: The heart is enlarged. There is mild interstitial pulmonary edema. Bilateral pleural effusions, left greater than right. There has been some improvement in the appearance of airspace filling opacities and consolidation in the left lower lobe. IMPRESSION: 1. Improved appearance of left lower lobe consolidation. 2. Cardiomegaly and mild edema persist. 3. Bilateral pleural effusions left greater than right. Electronically Signed   By: Norva Pavlov M.D.   On: 10/29/2017 20:56   US Renal  Result Date: 11/24/2017 CLINICAL DATA:  ATN EXAM: RENAL / URINARY TRACT ULTRASOUND COMPLETE COMPARISON:  10/16/2017 FINDINGS: Right Kidney: Length: 10.6 cm. 3.6 x 3.3 x 3.6 cm upper pole simple cyst. No hydronephrosis. Left Kidney: Not discretely visualized in this patient with known left renal atrophy. Bladder: Decompressed by indwelling Foley catheter. Additional comments: Small left pleural effusion. IMPRESSION: 3.6 cm right upper pole renal cyst, simple.  No hydronephrosis. Left kidney not discretely visualized in this patient with known left renal atrophy. Bladder decompressed by indwelling Foley catheter. Electronically Signed   By: Charline Bills M.D.   On: 11/24/2017 16:26    Time Spent in minutes  25   Orpheus Hayhurst M.D on 11/25/2017 at  10:07 AM  Between 7am to 7pm - Pager - [phone]  After 7pm go to [email or web address] - password [facility]  [facility] -  Office  [phone]

## 2017-11-25 NOTE — Progress Notes (Signed)
Pharmacist Heart Failure Core Measure Documentation  Assessment: [name] [name] has an EF documented as 15% on 10/17/17 by echo.  Rationale: Heart failure patients with left ventricular systolic dysfunction (LVSD) and an EF < 40% should be prescribed an angiotensin converting enzyme inhibitor (ACEI) or angiotensin receptor blocker (ARB) at discharge unless a contraindication is documented in the medical record.  This patient is not currently on an ACEI or ARB for HF.  This note is being placed in the record in order to provide documentation that a contraindication to the use of these agents is present for this encounter.  ACE Inhibitor or Angiotensin Receptor Blocker is contraindicated (specify all that apply)  []   ACEI allergy AND ARB allergy []   Angioedema []   Moderate or severe aortic stenosis []   Hyperkalemia []   Hypotension []   Renal artery stenosis [x]   Worsening renal function, preexisting renal disease or dysfunction   [name] [name] 11/25/2017 11:58 AM

## 2017-11-25 NOTE — Progress Notes (Addendum)
Subjective: Interval History: Patient presently crying because somebody told her she is not going to make it.  She states she is feeling okay.  She did not have any nausea or vomiting.  Breathing is a little bit better  Objective: Vital signs in last 24 hours: Temp:  [97.9 F (36.6 C)-98.5 F (36.9 C)] 97.9 F (36.6 C) (03/02 0544) Pulse Rate:  [62-91] 70 (03/02 0800) Resp:  [13-23] 18 (03/02 0800) BP: (91-123)/(50-85) 108/56 (03/02 0800) SpO2:  [62 %-98 %] 93 % (03/02 0800) Weight:  [46.8 kg (103 lb 2.8 oz)] 46.8 kg (103 lb 2.8 oz) (03/02 0500) Weight change: 0.5 kg (1 lb 1.6 oz)  Intake/Output from previous day: 03/01 0701 - 03/02 0700 In: 120 [P.O.:120] Out: 400 [Urine:400] Intake/Output this shift: No intake/output data recorded.  General appearance: alert, cooperative and no distress Resp: diminished breath sounds bilaterally Cardio: regular rate and rhythm Extremities: No edema  Lab Results: Recent Labs    11/24/17 0550 11/25/17 0440  WBC 14.8* 9.1  HGB 12.1 12.0  HCT 38.3 37.6  PLT 186 241   BMET:  Recent Labs    11/24/17 0550 11/25/17 0440  NA 135 134*  K 5.6* 5.2*  CL 101 101  CO2 17* 17*  GLUCOSE 134* 100*  BUN 55* 64*  CREATININE 2.86* 3.22*  CALCIUM 9.5 9.2   No results for input(s): PTH in the last 72 hours. Iron Studies: No results for input(s): IRON, TIBC, TRANSFERRIN, FERRITIN in the last 72 hours.  Studies/Results: Dg Chest 1 View  Result Date: 11/24/2017 CLINICAL DATA:  Shortness of breath.  CHF. EXAM: CHEST 1 VIEW COMPARISON:  Chest x-ray 11/22/2017. FINDINGS: Cardiomegaly with normal pulmonary vascularity. Mild bilateral interstitial prominence and small left-sided pleural effusion. Findings suggest mild CHF. No acute bony abnormality. Carotid vascular calcification. IMPRESSION: 1. Congestive heart failure with mild bilateral pulmonary interstitial edema and small left pleural effusion. 2.  Carotid vascular disease. Electronically Signed    By: Maisie Fus  Register   On: 11/24/2017 15:22   US Renal  Result Date: 11/24/2017 CLINICAL DATA:  ATN EXAM: RENAL / URINARY TRACT ULTRASOUND COMPLETE COMPARISON:  10/16/2017 FINDINGS: Right Kidney: Length: 10.6 cm. 3.6 x 3.3 x 3.6 cm upper pole simple cyst. No hydronephrosis. Left Kidney: Not discretely visualized in this patient with known left renal atrophy. Bladder: Decompressed by indwelling Foley catheter. Additional comments: Small left pleural effusion. IMPRESSION: 3.6 cm right upper pole renal cyst, simple.  No hydronephrosis. Left kidney not discretely visualized in this patient with known left renal atrophy. Bladder decompressed by indwelling Foley catheter. Electronically Signed   By: Charline Bills M.D.   On: 11/24/2017 16:26    I have reviewed the patient's current medications.  Assessment/Plan: 1] acute kidney injury superimposed on chronic: Possibly prerenal/ATN/cardiorenal.  Her creatinine has increased to 3.22.  She has about 400 cc of urine output.  Presently she does not have any nausea or vomiting. 2] chronic renal failure: Stage IV.  Possibly a combination of hypertension/renal artery stenosis [patient had right atrophic kidney]. 3] hyperkalemia: Her potassium has improved 4] low CO2: Possibly metabolic 5] history of combined systolic and diastolic heart failure.  Presently feeling better.  Her urine output is not adequate.  She is on Lasix. 6] hypertension: Her blood pressure is reasonably controlled 7] diabetes 8] history of PE Plan: We will continue his hydration and increase the rate 2] we will increase Lasix to improve her urine output and also help [place] with her hyperkalemia 3]  we will start her on sodium bicarb 650 mg p.o. twice daily 4] we will check a renal panel in the morning    LOS: 2 days   Claudette Wermuth S 11/25/2017,9:31 AM

## 2017-11-26 DIAGNOSIS — I130 Hypertensive heart and chronic kidney disease with heart failure and stage 1 through stage 4 chronic kidney disease, or unspecified chronic kidney disease: Principal | ICD-10-CM

## 2017-11-26 LAB — CBC
HCT: 35.1 % — ABNORMAL LOW (ref 36.0–46.0)
HEMOGLOBIN: 11 g/dL — AB (ref 12.0–15.0)
MCH: 26.8 pg (ref 26.0–34.0)
MCHC: 31.3 g/dL (ref 30.0–36.0)
MCV: 85.6 fL (ref 78.0–100.0)
Platelets: 247 10*3/uL (ref 150–400)
RBC: 4.1 MIL/uL (ref 3.87–5.11)
RDW: 14.1 % (ref 11.5–15.5)
WBC: 6.7 10*3/uL (ref 4.0–10.5)

## 2017-11-26 LAB — RENAL FUNCTION PANEL
ALBUMIN: 2.8 g/dL — AB (ref 3.5–5.0)
ANION GAP: 13 (ref 5–15)
BUN: 67 mg/dL — ABNORMAL HIGH (ref 6–20)
CALCIUM: 8.7 mg/dL — AB (ref 8.9–10.3)
CO2: 23 mmol/L (ref 22–32)
Chloride: 97 mmol/L — ABNORMAL LOW (ref 101–111)
Creatinine, Ser: 2.86 mg/dL — ABNORMAL HIGH (ref 0.44–1.00)
GFR calc Af Amer: 16 mL/min — ABNORMAL LOW (ref >60)
GFR, EST NON AFRICAN AMERICAN: 13 mL/min — AB (ref >60)
Glucose, Bld: 104 mg/dL — ABNORMAL HIGH (ref 65–99)
PHOSPHORUS: 4.6 mg/dL (ref 2.5–4.6)
Potassium: 4.1 mmol/L (ref 3.5–5.1)
SODIUM: 133 mmol/L — AB (ref 135–145)

## 2017-11-26 LAB — GLUCOSE, CAPILLARY
GLUCOSE-CAPILLARY: 101 mg/dL — AB (ref 65–99)
Glucose-Capillary: 144 mg/dL — ABNORMAL HIGH (ref 65–99)

## 2017-11-26 NOTE — Progress Notes (Signed)
Subjective: Interval History: Patient feels better.  Denies any difficulty breathing.  She has some cough.  No nausea or vomiting.  Objective: Vital signs in last 24 hours: Temp:  [97.7 F (36.5 C)-98.6 F (37 C)] 98.6 F (37 C) (03/03 0508) Pulse Rate:  [69-79] 69 (03/03 0508) Resp:  [18] 18 (03/02 1500) BP: (111-124)/(62-82) 111/62 (03/03 0508) SpO2:  [91 %-94 %] 94 % (03/03 0508) Weight:  [49 kg (108 lb 0.4 oz)] 49 kg (108 lb 0.4 oz) (03/03 0508) Weight change: 2.2 kg (4 lb 13.6 oz)  Intake/Output from previous day: 03/02 0701 - 03/03 0700 In: 50 [P.O.:50] Out: 1500 [Urine:1500] Intake/Output this shift: No intake/output data recorded.  General appearance: alert, cooperative and no distress Resp: diminished breath sounds bilaterally Cardio: regular rate and rhythm Extremities: No edema  Lab Results: Recent Labs    11/25/17 0440 11/26/17 0757  WBC 9.1 6.7  HGB 12.0 11.0*  HCT 37.6 35.1*  PLT 241 247   BMET:  Recent Labs    11/25/17 0440 11/26/17 0757  NA 134* 133*  K 5.2* 4.1  CL 101 97*  CO2 17* 23  GLUCOSE 100* 104*  BUN 64* 67*  CREATININE 3.22* 2.86*  CALCIUM 9.2 8.7*   No results for input(s): PTH in the last 72 hours. Iron Studies: No results for input(s): IRON, TIBC, TRANSFERRIN, FERRITIN in the last 72 hours.  Studies/Results: Dg Chest 1 View  Result Date: 11/24/2017 CLINICAL DATA:  Shortness of breath.  CHF. EXAM: CHEST 1 VIEW COMPARISON:  Chest x-ray 11/22/2017. FINDINGS: Cardiomegaly with normal pulmonary vascularity. Mild bilateral interstitial prominence and small left-sided pleural effusion. Findings suggest mild CHF. No acute bony abnormality. Carotid vascular calcification. IMPRESSION: 1. Congestive heart failure with mild bilateral pulmonary interstitial edema and small left pleural effusion. 2.  Carotid vascular disease. Electronically Signed   By: Maisie Fus  Register   On: 11/24/2017 15:22   US Renal  Result Date: 11/24/2017 CLINICAL DATA:   ATN EXAM: RENAL / URINARY TRACT ULTRASOUND COMPLETE COMPARISON:  10/16/2017 FINDINGS: Right Kidney: Length: 10.6 cm. 3.6 x 3.3 x 3.6 cm upper pole simple cyst. No hydronephrosis. Left Kidney: Not discretely visualized in this patient with known left renal atrophy. Bladder: Decompressed by indwelling Foley catheter. Additional comments: Small left pleural effusion. IMPRESSION: 3.6 cm right upper pole renal cyst, simple.  No hydronephrosis. Left kidney not discretely visualized in this patient with known left renal atrophy. Bladder decompressed by indwelling Foley catheter. Electronically Signed   By: Charline Bills M.D.   On: 11/24/2017 16:26    I have reviewed the patient's current medications.  Assessment/Plan: 1] acute kidney injury superimposed on chronic: Possibly prerenal/ATN/cardiorenal.  Her creatinine is 2.86 start improving.  Patient is nonoliguric. 2] chronic renal failure: Stage IV.  Possibly a combination of hypertension/renal artery stenosis [patient had right atrophic kidney]. 3] hyperkalemia: Her potassium is 4.  1 is normal. 4] low CO2: Possibly metabolic 5] history of combined systolic and diastolic heart failure.  Presently feeling better.  Her urine output is not adequate.  She is on Lasix.  Patient had 1500 cc of urine output.  Patient is a symptomatic.  Her urine output has improved 6] hypertension: Her blood pressure is reasonably controlled 7] diabetes: Her random blood sugar is reasonable. 8] history of PE 9] low CO2: Possibly metabolic.  She is on sodium bicarb.  Her CO2 has improved to 23. Plan: We will continue his hydration and Lasix. 2] will DC sodium bicarbonate. 3] we  will check a renal panel in the morning    LOS: 3 days   Yadiel Aubry S 11/26/2017,10:05 AM

## 2017-11-26 NOTE — Progress Notes (Signed)
PROGRESS NOTE                                                                                                                                                                                                             Patient Demographics:    [name] [name], is a 84 y.o. female, DOB - 10/03/1923, ZOX:[id number]  Admit date - 11/22/2017   Admitting Physician Pratik Hoover Brunette, DO  Outpatient Primary MD for the patient is Kirstie Peri, MD  LOS - 3  Outpatient Specialists:  Dr. Wyline Mood  Chief Complaint  Patient presents with  . Shortness of Breath       Brief Narrative   84 year old female with diffuse nonobstructive CAD, chronic combined systolic and diastolic CHF (last echo in 06/2017 with EF of 15-20%), LBBB, moderate mitral regurgitation, hypertension, history of CVA in 2017, hyperlipidemia, chronic kidney disease stage III (baseline creatinine around 1.4) who was hospitalized both in January and February this month with acute on chronic CHF and her Lasix dose increased during last hospitalization.  (40 mg daily).  Prior to that she was hospitalized in October 2018 with atypical chest pain, narrow complex tachycardia and CHF.  During her hospitalization in January she also had acute on chronic kidney disease and was seen by nephrology. She presented to the ED on 2/27 with palpitation and shortness of breath with finding of acute on chronic combined systolic and diastolic CHF (BNP greater than 4500, mildly elevated troponin and chest x-ray with increased left pleural effusion. She was also found to have acute on chronic kidney disease with creatinine of 2.08. Patient given a dose of IV Lasix and admitted to stepdown unit.      Subjective:   Reports breathing to be okay.  No overnight events patient diuresing quite well now but still is very weak with poor p.o. intake.  Assessment  & Plan :    Principal Problem:   Acute on  chronic combined systolic and diastolic CHF (congestive heart failure) (HCC) Recurrent hospitalizations (almost 6 in the past 5 months) with same problem. Has significantly low EF (15-20%), along with LBBB, MR and now with acute kidney injury. Currently getting IV fluids along with IV Lasix with good diuresis.  Overall prognosis still remains guarded.  Cardiology consult appreciated.   Acute on chronic kidney disease stage III Likely cardiorenal with  advanced CHF.  Left oliguric renal failure, now slowly improving with fluids and Lasix.  Appreciate renal consult.Marland Kitchen   ?Paroxysmal atrial tachycardia Started on amiodarone without bolus by cardiology on 2/28 but was discontinued due to hypotension.  Heart rate currently stable.  Electrolytes replenished.  Adjust beta-blocker dose as blood pressure tolerated.  Coronary artery disease Nonobstructive CAD in 2004.  Troponin mildly elevated but flat.  No further intervention.  Moderate MR/TR Not a candidate for further intervention.  Type 2 diabetes mellitus Monitor on sliding scale coverage.  hyperkalemia Improved with diuresis.  Severe protein calorie malnutrition Nutrition consult appreciated  Scabies Ordered permethrin cream.  Social worker aware to evaluate her home situation and will contact her home health agency to look into it.  Goals of care Overall prognosis remains guarded.  Patient now has been made DNR with goal on transitioning to comfort if no improvement.  She still has a very poor prognosis and will benefit from going home with palliative care services (patient refused placement).  CODE STATUS: DNR  Family Communication  : Discussed with healthcare power of attorney on 3/2.  Disposition Plan  : Pending hospital course,   Barriers For Discharge : Active symptoms  Consults  : Cardiology, renal  Procedures  : None  DVT Prophylaxis  :  Lovenox -   Lab Results  Component Value Date   PLT 247 11/26/2017     Antibiotics  :    Anti-infectives (From admission, onward)   None        Objective:   Vitals:   11/25/17 1500 11/25/17 1952 11/25/17 2111 11/26/17 0508  BP: 117/82 124/66  111/62  Pulse: 74 79  69  Resp: 18     Temp: 97.9 F (36.6 C) 97.7 F (36.5 C)  98.6 F (37 C)  TempSrc: Oral Axillary  Axillary  SpO2: 93% 92% 91% 94%  Weight:    49 kg (108 lb 0.4 oz)  Height:        Wt Readings from Last 3 Encounters:  11/26/17 49 kg (108 lb 0.4 oz)  11/16/17 48.5 kg (107 lb)  11/13/17 48.5 kg (107 lb)     Intake/Output Summary (Last 24 hours) at 11/26/2017 1213 Last data filed at 11/26/2017 0821 Gross per 24 hour  Intake 170 ml  Output 1500 ml  Net -1330 ml     Physical Exam General: Elderly cachectic female in no acute distress, fatigued HEENT: Moist mucosa, supple neck : Chest: Improved bibasilar breath sounds CVS: Normal S1 and S2, no murmurs GI: Soft, nondistended, nontender Musculoskeletal: Warm, no edema, Foley +       Data Review:    CBC Recent Labs  Lab 11/23/17 0115 11/24/17 0550 11/25/17 0440 11/26/17 0757  WBC 5.9 14.8* 9.1 6.7  HGB 11.2* 12.1 12.0 11.0*  HCT 35.4* 38.3 37.6 35.1*  PLT 179 186 241 247  MCV 86.1 85.1 85.6 85.6  MCH 27.3 26.9 27.3 26.8  MCHC 31.6 31.6 31.9 31.3  RDW 14.1 14.3 14.2 14.1  LYMPHSABS 1.1  --   --   --   MONOABS 0.6  --   --   --   EOSABS 0.0  --   --   --   BASOSABS 0.0  --   --   --     Chemistries  Recent Labs  Lab 11/22/17 2334 11/23/17 1507 11/24/17 0550 11/25/17 0440 11/26/17 0757  NA 137 133* 135 134* 133*  K 4.5 4.8 5.6* 5.2* 4.1  CL  106 102 101 101 97*  CO2 19* 18* 17* 17* 23  GLUCOSE 145* 147* 134* 100* 104*  BUN 42* 46* 55* 64* 67*  CREATININE 2.08* 2.38* 2.86* 3.22* 2.86*  CALCIUM 9.5 9.4 9.5 9.2 8.7*  MG 1.5*  --   --   --   --   AST 26  --   --   --   --   ALT 23  --   --   --   --   ALKPHOS 128*  --   --   --   --   BILITOT 1.6*  --   --   --   --     ------------------------------------------------------------------------------------------------------------------ No results for input(s): CHOL, HDL, LDLCALC, TRIG, CHOLHDL, LDLDIRECT in the last 72 hours.  Lab Results  Component Value Date   HGBA1C 5.5 06/15/2016   ------------------------------------------------------------------------------------------------------------------ Recent Labs    11/23/17 1507  TSH 2.860   ------------------------------------------------------------------------------------------------------------------ No results for input(s): VITAMINB12, FOLATE, FERRITIN, TIBC, IRON, RETICCTPCT in the last 72 hours.  Coagulation profile No results for input(s): INR, PROTIME in the last 168 hours.  No results for input(s): DDIMER in the last 72 hours.  Cardiac Enzymes Recent Labs  Lab 11/23/17 0248 11/23/17 0948 11/23/17 1507  TROPONINI 0.08* 0.09* 0.09*   ------------------------------------------------------------------------------------------------------------------    Component Value Date/Time   BNP >4,500.0 (H) 11/22/2017 2334    Inpatient Medications  Scheduled Meds: . aspirin EC  81 mg Oral Daily  . furosemide  80 mg Intravenous BID  . heparin  5,000 Units Subcutaneous Q8H  . insulin aspart  0-5 Units Subcutaneous QHS  . insulin aspart  0-9 Units Subcutaneous TID WC  . metoprolol succinate  12.5 mg Oral Daily  . sodium chloride flush  3 mL Intravenous Q12H   Continuous Infusions: . sodium chloride     PRN Meds:.sodium chloride, acetaminophen **OR** acetaminophen, hydrocortisone, hydrocortisone cream, hydrOXYzine, ondansetron **OR** ondansetron (ZOFRAN) IV, sodium chloride flush, traMADol-acetaminophen  Micro Results Recent Results (from the past 240 hour(s))  MRSA PCR Screening     Status: None   Collection Time: 11/23/17  6:21 AM  Result Value Ref Range Status   MRSA by PCR NEGATIVE NEGATIVE Final    Comment:        The GeneXpert  MRSA Assay (FDA approved for NASAL specimens only), is one component of a comprehensive MRSA colonization surveillance program. It is not intended to diagnose MRSA infection nor to guide or monitor treatment for MRSA infections. Performed at [facility], [address], [place], [place] [address]     Radiology Reports Dg Chest 1 View  Result Date: 11/24/2017 CLINICAL DATA:  Shortness of breath.  CHF. EXAM: CHEST 1 VIEW COMPARISON:  Chest x-ray 11/22/2017. FINDINGS: Cardiomegaly with normal pulmonary vascularity. Mild bilateral interstitial prominence and small left-sided pleural effusion. Findings suggest mild CHF. No acute bony abnormality. Carotid vascular calcification. IMPRESSION: 1. Congestive heart failure with mild bilateral pulmonary interstitial edema and small left pleural effusion. 2.  Carotid vascular disease. Electronically Signed   By: Maisie Fus  Register   On: 11/24/2017 15:22   Dg Chest 2 View  Result Date: 11/22/2017 CLINICAL DATA:  84 year old female with shortness of breath. EXAM: CHEST  2 VIEW COMPARISON:  Chest radiograph dated 11/01/2017 FINDINGS: Small left pleural effusion similar or slightly increased compared to the prior radiograph with associated compressive atelectasis of the left lung base. Superimposed pneumonia or underlying mass is not excluded. Clinical correlation is recommended. The right lung is clear. There  is no pneumothorax. Stable cardiomegaly. Atherosclerotic calcification of the aortic arch. No acute osseous pathology. Osteopenia and degenerative changes of the spine. A pin is noted in the soft tissues of the left axilla. IMPRESSION: Small left pleural effusion similar or slightly increased since the prior radiograph. Clinical correlation and continued follow-up recommended. Cardiomegaly. Pin in the soft tissues of the left axilla and posterior upper arm. This may be related to overlying dressing. Clinical correlation is recommended. Electronically Signed    By: Elgie Collard M.D.   On: 11/22/2017 22:28   Dg Chest 2 View  Result Date: 11/01/2017 CLINICAL DATA:  Chest pain.  Shortness of breath. EXAM: CHEST  2 VIEW COMPARISON:  October 29, 2017 FINDINGS: Small left effusion and associated infiltrate in the left base. No other interval changes or acute abnormalities. IMPRESSION: Small left effusion and associated infiltrate in the lateral left lung base. Recommend follow-up to resolution. Electronically Signed   By: Gerome Sam III M.D   On: 11/01/2017 18:54   Dg Chest 2 View  Result Date: 10/29/2017 CLINICAL DATA:  Per ED notes: Patient released from hospital on Wednesday , admit for CHF. Patient reports SOB and chest pain. No chest pain currently EXAM: CHEST  2 VIEW COMPARISON:  10/16/2017 FINDINGS: The heart is enlarged. There is mild interstitial pulmonary edema. Bilateral pleural effusions, left greater than right. There has been some improvement in the appearance of airspace filling opacities and consolidation in the left lower lobe. IMPRESSION: 1. Improved appearance of left lower lobe consolidation. 2. Cardiomegaly and mild edema persist. 3. Bilateral pleural effusions left greater than right. Electronically Signed   By: Norva Pavlov M.D.   On: 10/29/2017 20:56   US Renal  Result Date: 11/24/2017 CLINICAL DATA:  ATN EXAM: RENAL / URINARY TRACT ULTRASOUND COMPLETE COMPARISON:  10/16/2017 FINDINGS: Right Kidney: Length: 10.6 cm. 3.6 x 3.3 x 3.6 cm upper pole simple cyst. No hydronephrosis. Left Kidney: Not discretely visualized in this patient with known left renal atrophy. Bladder: Decompressed by indwelling Foley catheter. Additional comments: Small left pleural effusion. IMPRESSION: 3.6 cm right upper pole renal cyst, simple.  No hydronephrosis. Left kidney not discretely visualized in this patient with known left renal atrophy. Bladder decompressed by indwelling Foley catheter. Electronically Signed   By: Charline Bills M.D.   On:  11/24/2017 16:26    Time Spent in minutes  25   Makinzee Durley M.D on 11/26/2017 at 12:13 PM  Between 7am to 7pm - Pager - [phone]  After 7pm go to [email or web address] - password [facility]  [facility] -  Office  [phone]

## 2017-11-27 ENCOUNTER — Encounter (HOSPITAL_COMMUNITY): Payer: Self-pay | Admitting: Primary Care

## 2017-11-27 DIAGNOSIS — Z7189 Other specified counseling: Secondary | ICD-10-CM

## 2017-11-27 DIAGNOSIS — Z515 Encounter for palliative care: Secondary | ICD-10-CM

## 2017-11-27 LAB — GLUCOSE, CAPILLARY
GLUCOSE-CAPILLARY: 146 mg/dL — AB (ref 65–99)
GLUCOSE-CAPILLARY: 150 mg/dL — AB (ref 65–99)
GLUCOSE-CAPILLARY: 111 mg/dL — AB (ref 65–99)
Glucose-Capillary: 104 mg/dL — ABNORMAL HIGH (ref 65–99)

## 2017-11-27 LAB — RENAL FUNCTION PANEL
ANION GAP: 16 — AB (ref 5–15)
Albumin: 2.9 g/dL — ABNORMAL LOW (ref 3.5–5.0)
BUN: 71 mg/dL — AB (ref 6–20)
CHLORIDE: 95 mmol/L — AB (ref 101–111)
CO2: 23 mmol/L (ref 22–32)
Calcium: 9.3 mg/dL (ref 8.9–10.3)
Creatinine, Ser: 3.03 mg/dL — ABNORMAL HIGH (ref 0.44–1.00)
GFR, EST AFRICAN AMERICAN: 14 mL/min — AB (ref >60)
GFR, EST NON AFRICAN AMERICAN: 13 mL/min — AB (ref >60)
Glucose, Bld: 160 mg/dL — ABNORMAL HIGH (ref 65–99)
PHOSPHORUS: 4.7 mg/dL — AB (ref 2.5–4.6)
POTASSIUM: 4.7 mmol/L (ref 3.5–5.1)
Sodium: 134 mmol/L — ABNORMAL LOW (ref 135–145)

## 2017-11-27 MED ORDER — TORSEMIDE 20 MG PO TABS
20.0000 mg | ORAL_TABLET | Freq: Every day | ORAL | Status: DC
  Administered 2017-11-27 – 2017-11-29 (×3): 20 mg via ORAL
  Filled 2017-11-27 (×3): qty 1

## 2017-11-27 MED ORDER — DIPHENHYDRAMINE HCL 12.5 MG/5ML PO ELIX
12.5000 mg | ORAL_SOLUTION | Freq: Three times a day (TID) | ORAL | Status: DC | PRN
  Administered 2017-11-28 – 2017-11-29 (×2): 12.5 mg via ORAL
  Filled 2017-11-27 (×3): qty 5

## 2017-11-27 NOTE — Plan of Care (Signed)
  Acute Rehab PT Goals(only PT should resolve) Pt Will Go Supine/Side To Sit 11/27/2017 1558 - Progressing by Ocie Bob, PT Flowsheets Taken 11/27/2017 1558  Pt will go Supine/Side to Sit with supervision Patient Will Transfer Sit To/From Stand 11/27/2017 1558 - Progressing by Ocie Bob, PT Flowsheets Taken 11/27/2017 1558  Patient will transfer sit to/from stand with minimal assist Pt Will Transfer Bed To Chair/Chair To Bed 11/27/2017 1558 - Progressing by Ocie Bob, PT Flowsheets Taken 11/27/2017 1558  Pt will Transfer Bed to Chair/Chair to Bed with min assist Pt Will Ambulate 11/27/2017 1558 - Progressing by Ocie Bob, PT Flowsheets Taken 11/27/2017 1558  Pt will Ambulate 25 feet;with moderate assist;with rolling walker  3:59 PM, 11/27/17 Ocie Bob, MPT Physical Therapist with [facility] [phone] office [phone] mobile phone

## 2017-11-27 NOTE — Progress Notes (Signed)
Subjective: Interval History: She is feeling better.  She has some cough.  She does not have any nausea or vomiting.  Objective: Vital signs in last 24 hours: Temp:  [97.5 F (36.4 C)-97.7 F (36.5 C)] 97.5 F (36.4 C) (03/04 0542) Pulse Rate:  [58-67] 58 (03/04 0542) Resp:  [18] 18 (03/04 0542) BP: (101-110)/(67-71) 101/71 (03/04 0542) SpO2:  [89 %-96 %] 89 % (03/04 0542) Weight:  [45.5 kg (100 lb 5 oz)] 45.5 kg (100 lb 5 oz) (03/04 0542) Weight change: -3.5 kg (-11.5 oz)  Intake/Output from previous day: 03/03 0701 - 03/04 0700 In: 240 [P.O.:240] Out: 2900 [Urine:2900] Intake/Output this shift: No intake/output data recorded.  General appearance: alert, cooperative and no distress Resp: diminished breath sounds bilaterally Cardio: regular rate and rhythm Extremities: No edema  Lab Results: Recent Labs    11/25/17 0440 11/26/17 0757  WBC 9.1 6.7  HGB 12.0 11.0*  HCT 37.6 35.1*  PLT 241 247   BMET:  Recent Labs    11/26/17 0757 11/27/17 0546  NA 133* 134*  K 4.1 4.7  CL 97* 95*  CO2 23 23  GLUCOSE 104* 160*  BUN 67* 71*  CREATININE 2.86* 3.03*  CALCIUM 8.7* 9.3   No results for input(s): PTH in the last 72 hours. Iron Studies: No results for input(s): IRON, TIBC, TRANSFERRIN, FERRITIN in the last 72 hours.  Studies/Results: No results found.  I have reviewed the patient's current medications.  Assessment/Plan: 1] acute kidney injury superimposed on chronic: Possibly prerenal/ATN/cardiorenal.  Her creatinine has gone up today most likely from fluid removal.  She does not have any nausea or vomiting. 2] chronic renal failure: Stage IV.  Possibly a combination of hypertension/renal artery stenosis [patient had right atrophic kidney]. 3] hyperkalemia: Her potassium remains normal. 4] low CO2: Possibly metabolic.  Her CO2 is 23 reasonable 5] history of combined systolic and diastolic heart failure.  Patient is on IV Lasix and she has 2900 cc of urine output  which is reasonable.  Her breathing is somewhat better. 6] hypertension: Her blood pressure is reasonably controlled 7] diabetes: Her random blood sugar is reasonable. 8] history of PE Plan: We will DC Lasix 2] will put her on Demadex 20 mg once a day 3] we will check a renal panel in the morning    LOS: 4 days   Brannon Levene S 11/27/2017,8:01 AM

## 2017-11-27 NOTE — Progress Notes (Signed)
PROGRESS NOTE                                                                                                                                                                                                             Patient Demographics:    [name] [name], is a 84 y.o. female, DOB - 11/07/1923, ZOX:[id number]  Admit date - 11/22/2017   Admitting Physician Pratik Hoover Brunette, DO  Outpatient Primary MD for the patient is Kirstie Peri, MD  LOS - 4  Outpatient Specialists:  Dr. Wyline Mood  Chief Complaint  Patient presents with  . Shortness of Breath       Brief Narrative   84 year old female with diffuse nonobstructive CAD, chronic combined systolic and diastolic CHF (last echo in 06/2017 with EF of 15-20%), LBBB, moderate mitral regurgitation, hypertension, history of CVA in 2017, hyperlipidemia, chronic kidney disease stage III (baseline creatinine around 1.4) who was hospitalized both in January and February this month with acute on chronic CHF and her Lasix dose increased during last hospitalization.  (40 mg daily).  Prior to that she was hospitalized in October 2018 with atypical chest pain, narrow complex tachycardia and CHF.  During her hospitalization in January she also had acute on chronic kidney disease and was seen by nephrology. She presented to the ED on 2/27 with palpitation and shortness of breath with finding of acute on chronic combined systolic and diastolic CHF (BNP greater than 4500, mildly elevated troponin and chest x-ray with increased left pleural effusion. She was also found to have acute on chronic kidney disease with creatinine of 2.08. Patient given a dose of IV Lasix and admitted to stepdown unit.      Subjective:   Still appears very weak with poor appetite.  Denies any shortness of breath.  Assessment  & Plan :    Principal Problem:   Acute on chronic combined systolic and diastolic CHF  (congestive heart failure) (HCC) Recurrent hospitalizations (almost 6 in the past 5 months) with same problem. Has significantly low EF (15-20%), along with LBBB, MR and now with acute kidney injury. Has had good diuresis on both IV fluids and IV Lasix.  Renal function worsened today.  Nephrology recommends switching to daily torsemide and monitor.  Cardiology consult appreciated.   Acute on chronic kidney disease stage III Likely cardiorenal with advanced CHF.  Developed oliguric renal failure, good diuresis with IV fluids and Lasix. Renal function worsened today and nephrology switch to IV Lasix to torsemide.  Monitor closely.   ?Paroxysmal atrial tachycardia Started on amiodarone without bolus by cardiology on 2/28 but was discontinued due to hypotension.  Heart rate currently stable.  Electrolytes replenished.  Continue low-dose metoprolol.  Coronary artery disease Nonobstructive CAD in 2004.  Troponin mildly elevated but flat.  No further intervention.  Moderate MR/TR Not a candidate for further intervention.  Type 2 diabetes mellitus Monitor on sliding scale coverage.  hyperkalemia Improved with diuresis.  Severe protein calorie malnutrition Nutrition consult appreciated  Scabies Ordered permethrin cream.  Added Benadryl as needed for itching.  Social worker aware to evaluate her home situation and will contact her home health agency to look into it.  Goals of care Overall prognosis remains guarded.  Patient now has been made DNR with goal on transitioning to comfort if no improvement.  She still has a very poor prognosis and will benefit from going home with palliative care services (patient refused placement). Consulted palliative care for further goals of care discussion as patient's daughter also visiting today.  CODE STATUS: DNR  Family Communication  : Discussed with healthcare power of attorney on 3/2.  Daughter at bedside today.  Disposition Plan  : Pending  hospital course and improvement in her renal function.  Likely will go home with palliative care services in the next 1-2 days.  Barriers For Discharge : Active symptoms  Consults  : Cardiology, renal  Procedures  : None  DVT Prophylaxis  :  Lovenox -   Lab Results  Component Value Date   PLT 247 11/26/2017    Antibiotics  :    Anti-infectives (From admission, onward)   None        Objective:   Vitals:   11/25/17 2111 11/26/17 0508 11/26/17 2124 11/27/17 0542  BP:  111/62 110/67 101/71  Pulse:  69 67 (!) 58  Resp:   18 18  Temp:  98.6 F (37 C) 97.7 F (36.5 C) (!) 97.5 F (36.4 C)  TempSrc:  Axillary Oral Oral  SpO2: 91% 94% 96% (!) 89%  Weight:  49 kg (108 lb 0.4 oz)  45.5 kg (100 lb 5 oz)  Height:        Wt Readings from Last 3 Encounters:  11/27/17 45.5 kg (100 lb 5 oz)  11/16/17 48.5 kg (107 lb)  11/13/17 48.5 kg (107 lb)     Intake/Output Summary (Last 24 hours) at 11/27/2017 1155 Last data filed at 11/27/2017 0900 Gross per 24 hour  Intake 360 ml  Output 2900 ml  Net -2540 ml     Physical Exam General: Elderly cachectic female,, appears fatigued HEENT: Moist mucosa, supple neck Chest: Clear breath sounds bilaterally CVS: Normal S1 and S2, no murmurs GI: Soft, nondistended, nontender Musculoskeletal: Warm, no edema, Foley +       Data Review:    CBC Recent Labs  Lab 11/23/17 0115 11/24/17 0550 11/25/17 0440 11/26/17 0757  WBC 5.9 14.8* 9.1 6.7  HGB 11.2* 12.1 12.0 11.0*  HCT 35.4* 38.3 37.6 35.1*  PLT 179 186 241 247  MCV 86.1 85.1 85.6 85.6  MCH 27.3 26.9 27.3 26.8  MCHC 31.6 31.6 31.9 31.3  RDW 14.1 14.3 14.2 14.1  LYMPHSABS 1.1  --   --   --   MONOABS 0.6  --   --   --   EOSABS 0.0  --   --   --  BASOSABS 0.0  --   --   --     Chemistries  Recent Labs  Lab 11/22/17 2334 11/23/17 1507 11/24/17 0550 11/25/17 0440 11/26/17 0757 11/27/17 0546  NA 137 133* 135 134* 133* 134*  K 4.5 4.8 5.6* 5.2* 4.1 4.7  CL 106 102  101 101 97* 95*  CO2 19* 18* 17* 17* 23 23  GLUCOSE 145* 147* 134* 100* 104* 160*  BUN 42* 46* 55* 64* 67* 71*  CREATININE 2.08* 2.38* 2.86* 3.22* 2.86* 3.03*  CALCIUM 9.5 9.4 9.5 9.2 8.7* 9.3  MG 1.5*  --   --   --   --   --   AST 26  --   --   --   --   --   ALT 23  --   --   --   --   --   ALKPHOS 128*  --   --   --   --   --   BILITOT 1.6*  --   --   --   --   --    ------------------------------------------------------------------------------------------------------------------ No results for input(s): CHOL, HDL, LDLCALC, TRIG, CHOLHDL, LDLDIRECT in the last 72 hours.  Lab Results  Component Value Date   HGBA1C 5.5 06/15/2016   ------------------------------------------------------------------------------------------------------------------ No results for input(s): TSH, T4TOTAL, T3FREE, THYROIDAB in the last 72 hours.  Invalid input(s): FREET3 ------------------------------------------------------------------------------------------------------------------ No results for input(s): VITAMINB12, FOLATE, FERRITIN, TIBC, IRON, RETICCTPCT in the last 72 hours.  Coagulation profile No results for input(s): INR, PROTIME in the last 168 hours.  No results for input(s): DDIMER in the last 72 hours.  Cardiac Enzymes Recent Labs  Lab 11/23/17 0248 11/23/17 0948 11/23/17 1507  TROPONINI 0.08* 0.09* 0.09*   ------------------------------------------------------------------------------------------------------------------    Component Value Date/Time   BNP >4,500.0 (H) 11/22/2017 2334    Inpatient Medications  Scheduled Meds: . aspirin EC  81 mg Oral Daily  . heparin  5,000 Units Subcutaneous Q8H  . insulin aspart  0-5 Units Subcutaneous QHS  . insulin aspart  0-9 Units Subcutaneous TID WC  . metoprolol succinate  12.5 mg Oral Daily  . torsemide  20 mg Oral Daily   Continuous Infusions:  PRN Meds:.acetaminophen **OR** acetaminophen, diphenhydrAMINE, hydrocortisone,  hydrocortisone cream, ondansetron **OR** ondansetron (ZOFRAN) IV, traMADol-acetaminophen  Micro Results Recent Results (from the past 240 hour(s))  MRSA PCR Screening     Status: None   Collection Time: 11/23/17  6:21 AM  Result Value Ref Range Status   MRSA by PCR NEGATIVE NEGATIVE Final    Comment:        The GeneXpert MRSA Assay (FDA approved for NASAL specimens only), is one component of a comprehensive MRSA colonization surveillance program. It is not intended to diagnose MRSA infection nor to guide or monitor treatment for MRSA infections. Performed at [facility], [address] [place], [place] [address]     Radiology Reports Dg Chest 1 View  Result Date: 11/24/2017 CLINICAL DATA:  Shortness of breath.  CHF. EXAM: CHEST 1 VIEW COMPARISON:  Chest x-ray 11/22/2017. FINDINGS: Cardiomegaly with normal pulmonary vascularity. Mild bilateral interstitial prominence and small left-sided pleural effusion. Findings suggest mild CHF. No acute bony abnormality. Carotid vascular calcification. IMPRESSION: 1. Congestive heart failure with mild bilateral pulmonary interstitial edema and small left pleural effusion. 2.  Carotid vascular disease. Electronically Signed   By: Maisie Fus  Register   On: 11/24/2017 15:22   Dg Chest 2 View  Result Date: 11/22/2017 CLINICAL DATA:  84 year old female with  shortness of breath. EXAM: CHEST  2 VIEW COMPARISON:  Chest radiograph dated 11/01/2017 FINDINGS: Small left pleural effusion similar or slightly increased compared to the prior radiograph with associated compressive atelectasis of the left lung base. Superimposed pneumonia or underlying mass is not excluded. Clinical correlation is recommended. The right lung is clear. There is no pneumothorax. Stable cardiomegaly. Atherosclerotic calcification of the aortic arch. No acute osseous pathology. Osteopenia and degenerative changes of the spine. A pin is noted in the soft tissues of the left axilla.  IMPRESSION: Small left pleural effusion similar or slightly increased since the prior radiograph. Clinical correlation and continued follow-up recommended. Cardiomegaly. Pin in the soft tissues of the left axilla and posterior upper arm. This may be related to overlying dressing. Clinical correlation is recommended. Electronically Signed   By: Elgie Collard M.D.   On: 11/22/2017 22:28   Dg Chest 2 View  Result Date: 11/01/2017 CLINICAL DATA:  Chest pain.  Shortness of breath. EXAM: CHEST  2 VIEW COMPARISON:  October 29, 2017 FINDINGS: Small left effusion and associated infiltrate in the left base. No other interval changes or acute abnormalities. IMPRESSION: Small left effusion and associated infiltrate in the lateral left lung base. Recommend follow-up to resolution. Electronically Signed   By: Gerome Sam III M.D   On: 11/01/2017 18:54   Dg Chest 2 View  Result Date: 10/29/2017 CLINICAL DATA:  Per ED notes: Patient released from hospital on Wednesday , admit for CHF. Patient reports SOB and chest pain. No chest pain currently EXAM: CHEST  2 VIEW COMPARISON:  10/16/2017 FINDINGS: The heart is enlarged. There is mild interstitial pulmonary edema. Bilateral pleural effusions, left greater than right. There has been some improvement in the appearance of airspace filling opacities and consolidation in the left lower lobe. IMPRESSION: 1. Improved appearance of left lower lobe consolidation. 2. Cardiomegaly and mild edema persist. 3. Bilateral pleural effusions left greater than right. Electronically Signed   By: Norva Pavlov M.D.   On: 10/29/2017 20:56   US Renal  Result Date: 11/24/2017 CLINICAL DATA:  ATN EXAM: RENAL / URINARY TRACT ULTRASOUND COMPLETE COMPARISON:  10/16/2017 FINDINGS: Right Kidney: Length: 10.6 cm. 3.6 x 3.3 x 3.6 cm upper pole simple cyst. No hydronephrosis. Left Kidney: Not discretely visualized in this patient with known left renal atrophy. Bladder: Decompressed by indwelling  Foley catheter. Additional comments: Small left pleural effusion. IMPRESSION: 3.6 cm right upper pole renal cyst, simple.  No hydronephrosis. Left kidney not discretely visualized in this patient with known left renal atrophy. Bladder decompressed by indwelling Foley catheter. Electronically Signed   By: Charline Bills M.D.   On: 11/24/2017 16:26    Time Spent in minutes  25   Edgar Reisz M.D on 11/27/2017 at 11:55 AM  Between 7am to 7pm - Pager - [phone]  After 7pm go to [email or web address] - password [facility]  [facility] -  Office  [phone]

## 2017-11-27 NOTE — Consult Note (Signed)
Consultation Note Date: 11/27/2017   Patient Name: [name] [name]  DOB: Jan 20, 1924  MRN: [id number]  Age / Sex: 84 y.o., female  PCP: Monico Blitz, MD Referring Physician: Louellen Molder, MD  Reason for Consultation: Establishing goals of care and Psychosocial/spiritual support  HPI/Patient Profile: 84 y.o. female  with past medical history of CAD, chronic combined systolic and diastolic CHF with EF 44-92%, kidney disease stage III, BBB, high blood pressure and cholesterol, and prior CVA admitted on 11/22/2017 with acute on chronic heart disease, acute on chronic kidney disease.   Clinical Assessment and Goals of Care:  I have reviewed medical records including EPIC notes, labs and imaging, received report from nursing staff, assessed the patient and then met at the bedside along with daughter Verdis Frederickson and friend Ivin Booty to discuss diagnosis prognosis, [place], EOL wishes, disposition and options.  Call to healthcare power of attorney, Michail Jewels later in the afternoon.  Lengthy discussion related to [facility] POA and disposition.  I introduced Palliative Medicine as specialized medical care for people living with serious illness. It focuses on providing relief from the symptoms and stress of a serious illness. The goal is to improve quality of life for both the patient and the family.  As far as functional and nutritional status, Mrs. Dabbs states that she feels that she is eating well enough for her, and that she is sleeping well at night.  She is thin and frail.  We talked about worry over Mrs. Grattan's ability to care for herself at home.  She does have an aide during the day, but no one to help her in the evenings or stay with her overnight.  We discussed in detail what hospice can and cannot do, I share they come and do their task and leave.   We discussed their current illness and what it means in the larger context of  their on-going co-morbidities.  Natural disease trajectory and expectations at EOL were discussed.  I attempted to elicit values and goals of care important to the patient.  Mrs. Macpherson states that she would not want to go to a nursing home to live or even for rehab.  Daughter and healthcare power of attorney both state that they feel this would be best for Ms. Lassen, they asked the we give them enough time to encourage her to accept rehab.  PT evaluation requested  Advanced directives, concepts specific to code status, artifical feeding and hydration, and rehospitalization were considered and discussed in the afternoon phone call with [facility] POA Ron Dara Lords.  I share my concern over 4 hospitalizations in 3 months, 3 ED visits.  I share that Ms. Cocke will get sick again, and it is time to consider how we best care for her.  Hospice and Palliative Care services outpatient were explained and offered.  Discussed with [facility] POA who states that Mrs. Melgoza would not want inpatient hospice.  We talked about alternatives.  Questions and concerns were addressed.  The family was encouraged to call with questions or  concerns.  I share with Ron that PT eval will be completed.  We talked about choice of rehab.  He states [facility] is first choice, Immunologist at [place] second choice.  Both he and Verdis Frederickson would prefer someplace in town where they can visit more often.   Healthcare power of attorney HCPOA -per daughter, Verdis Frederickson friend Michail Jewels is HCPOA.  Requested to bring paperwork so that we may copy.    [place] power of attorney and daughter are requesting time to encourage Ms. Colantuono to accept rehab. PT evaluation to consider qualification for rehab.  Code Status/Advance Care Planning:  DNR - HCPOA agrees.   Symptom Management:   Per hospitalist, no additional needs at this time.  Palliative Prophylaxis:   Aspiration  Additional Recommendations (Limitations, Scope,  Preferences):  Treat the treatable but no CPR, no intubation  Psycho-social/Spiritual:   Desire for further Chaplaincy support:no  Additional Recommendations: Caregiving  Support/Resources and Education on Hospice  Prognosis:   < 6 months, or less would not be surprising based on current frailty/functional status, 4 hospitalizations in 6 months with 3 ED visits.  Discharge Planning: [facility] POA and daughter are requesting rehab, [facility] first choice, [facility] second choice.       Primary Diagnoses: Present on Admission: . Coronary artery disease . Left bundle branch block (LBBB) . Essential hypertension . Acute on chronic combined systolic and diastolic CHF (congestive heart failure) ([place])   I have reviewed the medical record, interviewed the patient and family, and examined the patient. The following aspects are pertinent.  Past Medical History:  Diagnosis Date  . Arthritis   . Chronic combined systolic and diastolic CHF (congestive heart failure) ([place])   . CKD (chronic kidney disease), stage IV ([place])   . Coronary artery disease    Cath 2004--60% LAD--diffuse and calcified, 40% PDA  . Depression   . Hyperlipidemia   . Hypertension   . LBBB (left bundle branch block)   . Mitral regurgitation   . Nonischemic cardiomyopathy ([place])    a. EF 30% 2004 b. 10/2006 EF=60-65% c. 06/2017: EF 15-20%, no regional WMA, Grade 1 DD, moderate MR  . Overactive bladder   . Pulmonary embolism ([place])    status post  . Stroke (cerebrum) ([place])   . SVT (supraventricular tachycardia) (HCC)    a. 32 beat run narrow complex rhythm in 06/2017.  . Type II diabetes mellitus ([place])    Social History   Socioeconomic History  . Marital status: Widowed    Spouse name: None  . Number of children: 2  . Years of education: None  . Highest education level: None  Social Needs  . Financial resource strain: None  . Food insecurity - worry: None  . Food insecurity - inability: None  . Transportation needs -  medical: None  . Transportation needs - non-medical: None  Occupational History  . Occupation: retired  Tobacco Use  . Smoking status: Never Smoker  . Smokeless tobacco: Never Used  Substance and Sexual Activity  . Alcohol use: No  . Drug use: No  . Sexual activity: No  Other Topics Concern  . None  Social History Narrative  . None   Family History  Problem Relation Age of Onset  . Stroke Mother        deceased   Scheduled Meds: . aspirin EC  81 mg Oral Daily  . heparin  5,000 Units Subcutaneous Q8H  . insulin aspart  0-5 Units Subcutaneous QHS  .  insulin aspart  0-9 Units Subcutaneous TID WC  . metoprolol succinate  12.5 mg Oral Daily  . torsemide  20 mg Oral Daily   Continuous Infusions: PRN Meds:.acetaminophen **OR** acetaminophen, diphenhydrAMINE, hydrocortisone, hydrocortisone cream, ondansetron **OR** ondansetron (ZOFRAN) IV, traMADol-acetaminophen Medications Prior to Admission:  Prior to Admission medications   Medication Sig Start Date End Date Taking? Authorizing Provider  aspirin EC 81 MG EC tablet Take 1 tablet (81 mg total) by mouth daily. 10/19/17  Yes Kathie Dike, MD  furosemide (LASIX) 40 MG tablet Take 1 tablet (40 mg total) by mouth daily. Patient taking differently: Take 20 mg by mouth daily.  11/13/17 02/11/18 Yes Strader, Fransisco Hertz, PA-C  hydrocortisone (ANUSOL-HC) 2.5 % rectal cream Apply 1 application topically 2 (two) times daily as needed for hemorrhoids. 10/18/17  Yes Kathie Dike, MD  hydrocortisone cream 1 % Apply 1 application topically 3 (three) times daily as needed for itching (minor skin irritation). 10/18/17  Yes Kathie Dike, MD  hydrOXYzine (VISTARIL) 25 MG capsule 1 at hs for itching. Patient taking differently: Take 25 mg by mouth every 8 (eight) hours as needed for itching. 1 at hs for itching. 11/16/17  Yes Lily Kocher, PA-C  metoprolol succinate (TOPROL-XL) 25 MG 24 hr tablet Take 0.5 tablets (12.5 mg total) by mouth daily.  10/27/17  Yes Strader, [place] M, PA-C  permethrin (ELIMITE) 5 % cream Apply to affected area once from head to toe especially in areas that are itching.  Keep on overnight.  Then rinse off. Patient not taking: Reported on 11/23/2017 11/18/17   Nat Christen, MD   No Known Allergies Review of Systems  Unable to perform ROS: Age    Physical Exam  Constitutional: She is oriented to person, place, and time.  Appears weak and frail, chronically ill.  Makes and keeps eye contact.  HENT:  Head: Atraumatic.  Temporal wasting, periorbital wasting.  Pulmonary/Chest: Effort normal. No respiratory distress.  Abdominal: Soft. She exhibits no distension.  Musculoskeletal:       Right lower leg: She exhibits no edema.       Left lower leg: She exhibits no edema.  Neurological: She is alert and oriented to person, place, and time.  Skin: Skin is warm and dry.  Psychiatric: Her mood appears not anxious. She is not agitated.  Nursing note and vitals reviewed.   Vital Signs: BP 101/71 (BP Location: Left Arm)   Pulse (!) 58   Temp (!) 97.5 F (36.4 C) (Oral)   Resp 18   Ht 5' 3"  (1.6 m)   Wt 45.5 kg (100 lb 5 oz)   SpO2 (!) 89%   BMI 17.77 kg/m  Pain Assessment: No/denies pain   Pain Score: 0-No pain   SpO2: SpO2: (!) 89 % O2 Device:SpO2: (!) 89 % O2 Flow Rate: .O2 Flow Rate (L/min): 0 L/min  IO: Intake/output summary:   Intake/Output Summary (Last 24 hours) at 11/27/2017 1302 Last data filed at 11/27/2017 0900 Gross per 24 hour  Intake 360 ml  Output 2900 ml  Net -2540 ml    LBM: Last BM Date: 11/25/17 Baseline Weight: Weight: 48 kg (105 lb 14.4 oz) Most recent weight: Weight: 45.5 kg (100 lb 5 oz)     Palliative Assessment/Data:   Flowsheet Rows     Most Recent Value  Intake Tab  Referral Department  Hospitalist  Unit at Time of Referral  Med/Surg Unit  Palliative Care Primary Diagnosis  Cardiac  Date Notified  11/26/17  Palliative  Care Type  New Palliative care  Reason for  referral  Clarify Goals of Care  Date of Admission  11/22/17  Date first seen by Palliative Care  11/27/17  # of days Palliative referral response time  1 Day(s)  # of days IP prior to Palliative referral  4  Clinical Assessment  Palliative Performance Scale Score  30%  Pain Max last 24 hours  Not able to report  Pain Min Last 24 hours  Not able to report  Dyspnea Max Last 24 Hours  Not able to report  Dyspnea Min Last 24 hours  Not able to report  Psychosocial & Spiritual Assessment  Palliative Care Outcomes  Palliative Care Outcomes  Counseled regarding hospice, Provided advance care planning, Provided psychosocial or spiritual support, Clarified goals of care  Patient/Family wishes: Interventions discontinued/not started   Mechanical Ventilation      Time In:     1105    1400 Time Out: 1200     1440 Time Total: 55 + 40 = 95 minutes Greater than 50%  of this time was spent counseling and coordinating care related to the above assessment and plan.  Signed by: Drue Novel, NP   Please contact Palliative Medicine Team phone at [phone] for questions and concerns.  For individual provider: See Shea Evans

## 2017-11-27 NOTE — Evaluation (Signed)
Physical Therapy Evaluation Patient Details Name: [name] [name] MRN: [id number] DOB: Aug 17, 1924 Today's Date: 11/27/2017   History of Present Illness  [name] [name] is a 84 y.o. female with medical history significant for CAD, chronic combined systolic and diastolic CHF with EF 15-20%, stage III-IV CKD, known left bundle branch block, hypertension, dyslipidemia, and prior CVA who presents to the emergency department with some palpitations along with dyspnea.  She has recently seen her cardiologist Dr. Wyline Mood on 2/18 and was noted to require an increase Lasix dose of 40 mg which she claims to be taking.  This was done in the office shortly after a couple admissions earlier this month for CHF exacerbation, where she left AMA on 2/7.  Additionally she was recently seen in the ED on 2/21 as well as 2/23 and was noted to have significant itching for which she was diagnosed with scabies, but has not started treatment with Permethrin cream.     Clinical Impression  Patient demonstrates slow labored movement for sitting up at bedside, required 3-4 minute rest break due to c/o dizziness once sitting, when taking steps had near fall due to buckling of knees secondary to weakness and tolerated sitting up in chair after therapy - RN notified.  Patient will benefit from continued physical therapy in hospital and recommended venue below to increase strength, balance, endurance for safe ADLs and gait.    Follow Up Recommendations SNF    Equipment Recommendations  None recommended by PT    Recommendations for Other Services       Precautions / Restrictions Precautions Precautions: Fall Restrictions Weight Bearing Restrictions: No      Mobility  Bed Mobility Overal bed mobility: Needs Assistance Bed Mobility: Supine to Sit     Supine to sit: Min assist        Transfers Overall transfer level: Needs assistance Equipment used: Rolling walker (2 wheeled) Transfers: Sit to/from [employer] Sit to Stand: Min assist;Mod assist Stand pivot transfers: Mod assist          Ambulation/Gait Ambulation/Gait assistance: Mod assist;Max assist Ambulation Distance (Feet): 5 Feet Assistive device: Rolling walker (2 wheeled) Gait Pattern/deviations: Decreased step length - right;Decreased step length - left;Decreased stride length   Gait velocity interpretation: Below normal speed for age/gender General Gait Details: limited to 5-6 steps due to buckling of knees, BLE weakness, fatigue  Stairs            Wheelchair Mobility    Modified Rankin (Stroke Patients Only)       Balance Overall balance assessment: Needs assistance Sitting-balance support: Feet supported;No upper extremity supported Sitting balance-Leahy Scale: Fair     Standing balance support: Bilateral upper extremity supported;During functional activity Standing balance-Leahy Scale: Poor Standing balance comment: fair/poor with RW                             Pertinent Vitals/Pain Pain Assessment: 0-10 Pain Score: 9  Pain Location: right shouder for nearly 1 year Pain Descriptors / Indicators: Aching;Discomfort Pain Intervention(s): Limited activity within patient's tolerance;Monitored during session    Home Living Family/patient expects to be discharged to:: Private residence Living Arrangements: Alone Available Help at Discharge: Personal care attendant;Family Type of Home: Apartment Home Access: Level entry     Home Layout: One level Home Equipment: Shower seat;Bedside commode;Walker - 2 wheels      Prior Function Level of Independence: Needs assistance   Gait / Transfers  Assistance Needed: Ambulates with RW for hosuehold distances  ADL's / Homemaking Assistance Needed: has caregivers 2 hours/day x 7 days/week        Hand Dominance        Extremity/Trunk Assessment   Upper Extremity Assessment Upper Extremity Assessment: Generalized weakness     Lower Extremity Assessment Lower Extremity Assessment: Generalized weakness    Cervical / Trunk Assessment Cervical / Trunk Assessment: Kyphotic  Communication   Communication: No difficulties  Cognition Arousal/Alertness: Awake/alert Behavior During Therapy: WFL for tasks assessed/performed Overall Cognitive Status: Within Functional Limits for tasks assessed                                        General Comments      Exercises     Assessment/Plan    PT Assessment Patient needs continued PT services  PT Problem List Decreased strength;Decreased activity tolerance;Decreased balance;Decreased mobility       PT Treatment Interventions Gait training;Functional mobility training;Therapeutic activities;Therapeutic exercise;Patient/family education    PT Goals (Current goals can be found in the Care Plan section)  Acute Rehab PT Goals Patient Stated Goal: return home PT Goal Formulation: With patient Time For Goal Achievement: 12/11/17 Potential to Achieve Goals: Good    Frequency Min 3X/week   Barriers to discharge        Co-evaluation               AM-PAC PT "6 Clicks" Daily Activity  Outcome Measure Difficulty turning over in bed (including adjusting bedclothes, sheets and blankets)?: A Little Difficulty moving from lying on back to sitting on the side of the bed? : A Little Difficulty sitting down on and standing up from a chair with arms (e.g., wheelchair, bedside commode, etc,.)?: A Lot Help needed moving to and from a bed to chair (including a wheelchair)?: A Lot Help needed walking in hospital room?: A Lot Help needed climbing 3-5 steps with a railing? : Total 6 Click Score: 13    End of Session   Activity Tolerance: Patient tolerated treatment well;Patient limited by fatigue Patient left: in chair;with call bell/phone within reach Nurse Communication: Mobility status;Other (comment)(RN notified that patient left up in chair) PT  Visit Diagnosis: Unsteadiness on feet (R26.81);Other abnormalities of gait and mobility (R26.89);Muscle weakness (generalized) (M62.81)    Time: 5320-2334 PT Time Calculation (min) (ACUTE ONLY): 24 min   Charges:   PT Evaluation $PT Eval Moderate Complexity: 1 Mod PT Treatments $Therapeutic Activity: 23-37 mins   PT G Codes:        3:56 PM, 01/10/2018 Ocie Bob, MPT Physical Therapist with [facility] [phone] office [phone] mobile phone

## 2017-11-28 LAB — RENAL FUNCTION PANEL
ALBUMIN: 2.9 g/dL — AB (ref 3.5–5.0)
ANION GAP: 16 — AB (ref 5–15)
BUN: 78 mg/dL — ABNORMAL HIGH (ref 6–20)
CALCIUM: 9.3 mg/dL (ref 8.9–10.3)
CO2: 24 mmol/L (ref 22–32)
Chloride: 93 mmol/L — ABNORMAL LOW (ref 101–111)
Creatinine, Ser: 3.16 mg/dL — ABNORMAL HIGH (ref 0.44–1.00)
GFR, EST AFRICAN AMERICAN: 14 mL/min — AB (ref >60)
GFR, EST NON AFRICAN AMERICAN: 12 mL/min — AB (ref >60)
Glucose, Bld: 98 mg/dL (ref 65–99)
PHOSPHORUS: 4.6 mg/dL (ref 2.5–4.6)
Potassium: 4.3 mmol/L (ref 3.5–5.1)
SODIUM: 133 mmol/L — AB (ref 135–145)

## 2017-11-28 LAB — GLUCOSE, CAPILLARY
GLUCOSE-CAPILLARY: 99 mg/dL (ref 65–99)
GLUCOSE-CAPILLARY: 108 mg/dL — AB (ref 65–99)
Glucose-Capillary: 166 mg/dL — ABNORMAL HIGH (ref 65–99)

## 2017-11-28 NOTE — Progress Notes (Signed)
Physical Therapy Treatment Patient Details Name: [name] [name] MRN: [id number] DOB: 1923-11-27 Today's Date: 11/28/2017    History of Present Illness [name] [name] is a 84 y.o. female with medical history significant for CAD, chronic combined systolic and diastolic CHF with EF 15-20%, stage III-IV CKD, known left bundle branch block, hypertension, dyslipidemia, and prior CVA who presents to the emergency department with some palpitations along with dyspnea.  She has recently seen her cardiologist Dr. Wyline Mood on 2/18 and was noted to require an increase Lasix dose of 40 mg which she claims to be taking.  This was done in the office shortly after a couple admissions earlier this month for CHF exacerbation, where she left AMA on 2/7.  Additionally she was recently seen in the ED on 2/21 as well as 2/23 and was noted to have significant itching for which she was diagnosed with scabies, but has not started treatment with Permethrin cream.     PT Comments    Patient demonstrates increased endurance/distance for gait training, able to ambulate outside of room into hallway with slightly labored slow movement, limited mostly due to c/o fatigue and tolerated sitting up in chair with BLE elevated after therapy.  Patient will benefit from continued physical therapy in hospital and recommended venue below to increase strength, balance, endurance for safe ADLs and gait.   Follow Up Recommendations  SNF     Equipment Recommendations  None recommended by PT    Recommendations for Other Services       Precautions / Restrictions Precautions Precautions: Fall Restrictions Weight Bearing Restrictions: No    Mobility  Bed Mobility Overal bed mobility: Needs Assistance Bed Mobility: Supine to Sit     Supine to sit: Min guard        Transfers Overall transfer level: Needs assistance Equipment used: Rolling walker (2 wheeled) Transfers: Sit to/from [employer] Sit to Stand: Min  assist Stand pivot transfers: Min assist          Ambulation/Gait Ambulation/Gait assistance: Min assist;Mod assist Ambulation Distance (Feet): 30 Feet Assistive device: Rolling walker (2 wheeled) Gait Pattern/deviations: Decreased step length - right;Decreased step length - left;Decreased stride length   Gait velocity interpretation: Below normal speed for age/gender General Gait Details: demonstrates increased endurance/distance with slow slightly labored cadence, occasional standing rest breaks, limited secondary to fatigue   Stairs            Wheelchair Mobility    Modified Rankin (Stroke Patients Only)       Balance Overall balance assessment: Needs assistance Sitting-balance support: Feet supported;No upper extremity supported Sitting balance-Leahy Scale: Fair Sitting balance - Comments: fair/good   Standing balance support: Bilateral upper extremity supported;During functional activity Standing balance-Leahy Scale: Fair                              Cognition Arousal/Alertness: Awake/alert Behavior During Therapy: WFL for tasks assessed/performed Overall Cognitive Status: Within Functional Limits for tasks assessed                                        Exercises General Exercises - Lower Extremity Ankle Circles/Pumps: AROM;Seated;Strengthening;Both;10 reps Long Arc Quad: Seated;AROM;Strengthening;Both;10 reps Hip Flexion/Marching: Seated;AROM;Strengthening;Both;10 reps    General Comments        Pertinent Vitals/Pain Pain Assessment: Faces Faces Pain Scale: Hurts a little bit  Pain Location: right shoulder Pain Descriptors / Indicators: Aching;Discomfort    Home Living                      Prior Function            PT Goals (current goals can now be found in the care plan section) Acute Rehab PT Goals Patient Stated Goal: return home PT Goal Formulation: With patient/family Time For Goal Achievement:  12/11/17 Potential to Achieve Goals: Good Progress towards PT goals: Progressing toward goals    Frequency    Min 3X/week      PT Plan Current plan remains appropriate    Co-evaluation              AM-PAC PT "6 Clicks" Daily Activity  Outcome Measure  Difficulty turning over in bed (including adjusting bedclothes, sheets and blankets)?: None Difficulty moving from lying on back to sitting on the side of the bed? : A Little Difficulty sitting down on and standing up from a chair with arms (e.g., wheelchair, bedside commode, etc,.)?: A Little Help needed moving to and from a bed to chair (including a wheelchair)?: A Little Help needed walking in hospital room?: A Little Help needed climbing 3-5 steps with a railing? : A Lot 6 Click Score: 18    End of Session   Activity Tolerance: Patient tolerated treatment well;Patient limited by fatigue Patient left: in chair;with call bell/phone within reach;with family/visitor present Nurse Communication: Mobility status;Other (comment)(RN aware patient left up in chair with family present) PT Visit Diagnosis: Unsteadiness on feet (R26.81);Other abnormalities of gait and mobility (R26.89);Other symptoms and signs involving the nervous system (R29.898)     Time: 1610-9604 PT Time Calculation (min) (ACUTE ONLY): 26 min  Charges:  $Therapeutic Activity: 23-37 mins                    G Codes:       1:37 PM, December 22, 2017 Ocie Bob, MPT Physical Therapist with [facility] [phone] office [phone] mobile phone

## 2017-11-28 NOTE — Care Management Important Message (Signed)
Important Message  Patient Details  Name: [name] [name] MRN: [id number] Date of Birth: December 19, 1923   Medicare Important Message Given:  Yes    Lillyian Heidt, [name], RN 11/28/2017, 9:37 AM

## 2017-11-28 NOTE — Progress Notes (Signed)
Daily Progress Note   Patient Name: [name] [name]       Date: 11/28/2017 DOB: Jun 09, 1924  Age: 84 y.o. MRN#: [id number] Attending Physician: Eddie North, MD Primary Care Physician: Kirstie Peri, MD Admit Date: 11/22/2017  Reason for Consultation/Follow-up: Establishing goals of care and Psychosocial/spiritual support  Subjective: [name] [name] is resting quietly in bed.  She appears very frail.  Present today at bedside is her daughter Byrd Hesselbach.  [name] wakens as Byrd Hesselbach and I are speaking.  She has no complaints at this time.  I encouraged her to do the best she can, and she states she will.  Byrd Hesselbach and I talk about SNF rehab.  Call to [facility] to discuss disposition.  He is agreeable to transfer to [facility] [place].  He shares that he spoke with admissions coordinator earlier today and was told that they would accept [name] [name].  We talked about "what is next".  I share that it would be acceptable for him to choose hospice services if/when [name] [name] declines.  I encouraged him to be prepared in that some people may not understand this choice.  We talked about what we can and cannot change with medications including her heart failure, and Ron states "and her kidney failure".  I encouraged him to call at any time with questions or concerns.  He shares that they have not told [name] [name], but he and daughter Byrd Hesselbach feel that she would never be able to return home.  I share that this is what I also expect.  We talked about transferring from community Medicaid to institutional Medicaid.  I encouraged him to work with Child psychotherapist at [employer].   Length of Stay: 5  Current Medications: Scheduled Meds:  . aspirin EC  81 mg Oral Daily  . heparin  5,000 Units Subcutaneous Q8H  . insulin  aspart  0-5 Units Subcutaneous QHS  . insulin aspart  0-9 Units Subcutaneous TID WC  . metoprolol succinate  12.5 mg Oral Daily  . torsemide  20 mg Oral Daily    Continuous Infusions:   PRN Meds: acetaminophen **OR** acetaminophen, diphenhydrAMINE, hydrocortisone, hydrocortisone cream, ondansetron **OR** ondansetron (ZOFRAN) IV, traMADol-acetaminophen  Physical Exam  Constitutional: She appears ill.  Appears acutely/chronically ill.  Very frail.  Makes and briefly keeps eye contact  HENT:  Head: Atraumatic.  Temporal wasting, periorbital wasting  Cardiovascular: Normal rate.  Pulmonary/Chest: Effort normal. No respiratory distress.  Musculoskeletal:       Right lower leg: She exhibits no edema.       Left lower leg: She exhibits no edema.  Neurological: She is alert.  Skin: Skin is warm and dry.  Psychiatric: Her mood appears not anxious. She is not agitated.  Nursing note and vitals reviewed.           Vital Signs: BP 115/68 (BP Location: Right Arm)   Pulse 67   Temp 97.7 F (36.5 C) (Oral)   Resp 17   Ht 5\' 3"  (1.6 m)   Wt 44.6 kg (98 lb 5.2 oz)   SpO2 99%   BMI 17.42 kg/m  SpO2: SpO2: 99 % O2 Device: O2 Device: Room Air O2 Flow Rate: O2 Flow Rate (L/min): 0 L/min  Intake/output summary:   Intake/Output Summary (Last 24 hours) at 11/28/2017 1542 Last data filed at 11/27/2017 2135 Gross per 24 hour  Intake 480 ml  Output 450 ml  Net 30 ml   LBM: Last BM Date: 11/26/17 Baseline Weight: Weight: 48 kg (105 lb 14.4 oz) Most recent weight: Weight: 44.6 kg (98 lb 5.2 oz)       Palliative Assessment/Data:    Flowsheet Rows     Most Recent Value  Intake Tab  Referral Department  Hospitalist  Unit at Time of Referral  Med/Surg Unit  Palliative Care Primary Diagnosis  Cardiac  Date Notified  11/26/17  Palliative Care Type  New Palliative care  Reason for referral  Clarify Goals of Care  Date of Admission  11/22/17  Date first seen by Palliative Care  11/27/17   # of days Palliative referral response time  1 Day(s)  # of days IP prior to Palliative referral  4  Clinical Assessment  Palliative Performance Scale Score  30%  Pain Max last 24 hours  Not able to report  Pain Min Last 24 hours  Not able to report  Dyspnea Max Last 24 Hours  Not able to report  Dyspnea Min Last 24 hours  Not able to report  Psychosocial & Spiritual Assessment  Palliative Care Outcomes  Palliative Care Outcomes  Counseled regarding hospice, Provided advance care planning, Provided psychosocial or spiritual support, Clarified goals of care  Patient/Family wishes: Interventions discontinued/not started   Mechanical Ventilation      Patient Active Problem List   Diagnosis Date Noted  . Goals of care, counseling/discussion   . Palliative care by specialist   . Encounter for hospice care discussion   . Protein-calorie malnutrition, severe 11/24/2017  . CKD (chronic kidney disease), stage III (HCC) 11/23/2017  . CHF (congestive heart failure) (HCC) 11/23/2017  . CHF exacerbation (HCC) 11/01/2017  . Acute on chronic combined systolic and diastolic CHF (congestive heart failure) (HCC) 10/15/2017  . Nonischemic cardiomyopathy (HCC) 10/15/2017  . Coronary artery disease 10/15/2017  . Chest pain 07/20/2017  . Type II diabetes mellitus (HCC)   . PE 10/01/2009  . Difficulty walking 10/01/2009  . Dysthymic disorder 07/06/2009  . UNSPECIFIED CHRONIC ISCHEMIC HEART DISEASE 07/06/2009  . Left bundle branch block (LBBB) 07/06/2009  . Coronary atherosclerosis 12/12/2007  . BACK PAIN, THORACIC REGION, CHRONIC 12/12/2007  . Hyperlipidemia 09/11/2007  . CATARACTS 09/11/2007  . Essential hypertension 09/11/2007  . OVERACTIVE BLADDER 09/11/2007  . Arthropathy 09/11/2007    Palliative Care Assessment & Plan   Patient Profile: 84 y.o. female  with past medical history of CAD, chronic combined systolic and diastolic CHF with EF 15-20%, kidney disease stage III, BBB, high blood  pressure and cholesterol, and prior CVA admitted on 11/22/2017 with acute on chronic heart disease, acute on chronic kidney disease.   Assessment: acute on chronic heart disease, acute on chronic kidney disease: [name] [name] has improved with medical treatment.  It is expected that she will continue to have exacerbations and declines.  HC POA/family has elected to try SNF rehab.  Recommendations/Plan:  SNF rehab  Considering hospice services.  Goals of Care and Additional Recommendations:  Limitations on Scope of Treatment: Continue to treat the treatable but no CPR, no intubation.  Code Status:    Code Status Orders  (From admission, onward)        Start     Ordered   11/23/17 1701  Do not attempt resuscitation (DNR)  Continuous    Question Answer Comment  In the event of cardiac or respiratory ARREST Do not call a "code blue"   In the event of cardiac or respiratory ARREST Do not perform Intubation, CPR, defibrillation or ACLS   In the event of cardiac or respiratory ARREST Use medication by any route, position, wound care, and other measures to relive pain and suffering. May use oxygen, suction and manual treatment of airway obstruction as needed for comfort.      11/23/17 1700    Code Status History    Date Active Date Inactive Code Status Order ID Comments User Context   11/23/2017 02:21 11/23/2017 17:00 Full Code [id number]  Erick Blinks, DO ED   11/01/2017 22:35 11/02/2017 17:39 Full Code [id number]  Meredeth Ide, MD Inpatient   07/20/2017 17:07 07/22/2017 16:02 Full Code [id number]  Jonah Blue, MD Inpatient   06/15/2016 14:09 06/16/2016 17:51 Full Code [id number]  Raliegh Ip, DO Inpatient    Advance Directive Documentation     Most Recent Value  Type of Advance Directive  Healthcare Power of Attorney, Living will  Pre-existing out of facility DNR order (yellow form or pink MOST form)  No data  "MOST" Form in Place?  No data       Prognosis:   < 6 months,  possibly 3 months or less would not be surprising based on current frailty/functional status, 4 hospitalizations in 6 months with 3 ED visits.  Discharge Planning:  Working toward SNF rehab.  Care plan was discussed with nursing staff, social worker, and Dr. Gonzella Lex.  Thank you for allowing the Palliative Medicine Team to assist in the care of this patient.   Time In: 1050 Time Out: 1115 Total Time 25 minutes Prolonged Time Billed  no       Greater than 50%  of this time was spent counseling and coordinating care related to the above assessment and plan.  Katheran Awe, NP  Please contact Palliative Medicine Team phone at [phone] for questions and concerns.

## 2017-11-28 NOTE — Progress Notes (Signed)
PROGRESS NOTE                                                                                                                                                                                                             Patient Demographics:    [name] [name], is a 84 y.o. female, DOB - [birth year], WUJ:[id number]  Admit date - 11/22/2017   Admitting Physician Pratik Hoover Brunette, DO  Outpatient Primary MD for the patient is Kirstie Peri, MD  LOS - 5  Outpatient Specialists:  Dr. Wyline Mood  Chief Complaint  Patient presents with  . Shortness of Breath       Brief Narrative   84 year old female with diffuse nonobstructive CAD, chronic combined systolic and diastolic CHF (last echo in 06/2017 with EF of 15-20%), LBBB, moderate mitral regurgitation, hypertension, history of CVA in 2017, hyperlipidemia, chronic kidney disease stage III (baseline creatinine around 1.4) who was hospitalized both in January and February this month with acute on chronic CHF and her Lasix dose increased during last hospitalization.  (40 mg daily).  Prior to that she was hospitalized in October 2018 with atypical chest pain, narrow complex tachycardia and CHF.  During her hospitalization in January she also had acute on chronic kidney disease and was seen by nephrology. She presented to the ED on 2/27 with palpitation and shortness of breath with finding of acute on chronic combined systolic and diastolic CHF (BNP greater than 4500, mildly elevated troponin and chest x-ray with increased left pleural effusion. She was also found to have acute on chronic kidney disease with creatinine of 2.08. Patient given a dose of IV Lasix and admitted to stepdown unit.      Subjective:   Still very weak.  Appetite not much improved.  Denies worsening shortness of breath.  Assessment  & Plan :    Principal Problem:   Acute on chronic combined systolic and diastolic CHF  (congestive heart failure) (HCC) Recurrent hospitalizations (almost 6 in the past 5 months) with same problem. Has significantly low EF (15-20%), along with LBBB, MR and now with acute kidney injury. Attempted diuresis with IV fluids and Lasix, improved initially but now with low urine output and worsening renal function.  Cardiology consult appreciated.   Acute on chronic kidney disease stage III Likely cardiorenal with advanced CHF.  Developing oliguric renal failure  with worsened renal function.  On daily torsemide as per renal.  Poor candidate for renal replacement therapy.   ?Paroxysmal atrial tachycardia Started on amiodarone without bolus by cardiology on 2/28 but was discontinued due to hypotension.  Heart rate currently stable.  Electrolytes replenished.  Continue low-dose metoprolol.  Coronary artery disease Nonobstructive CAD in 2004.  Troponin mildly elevated but flat.  No further intervention.  Moderate MR/TR Not a candidate for further intervention.  Type 2 diabetes mellitus Monitor on sliding scale coverage.  hyperkalemia Improved with diuresis.  Severe protein calorie malnutrition Nutrition consult appreciated  Scabies permethrin cream.  Added Benadryl as needed for itching.    Goals of care Overall prognosis remains guarded.  Patient now has been made DNR with goal on transitioning to comfort if no improvement.   Overall prognosis remains guarded.  Patient wants to go home but she lives alone and extremely deconditioned with high risk for rehospitalization.  She agrees on going to SNF with palliative care services.  Palliative care consult appreciated.  CODE STATUS: DNR  Family Communication  : Discussed with HC POA while in the hospital and daughter at bedside (her daughters are not involved in healthcare decision making)  Disposition Plan  : Referral sent to SNF.  Pending approval.  Barriers For Discharge : Awaiting SNF bed  Consults  : Cardiology,  renal  Procedures  : None  DVT Prophylaxis  :  Lovenox -   Lab Results  Component Value Date   PLT 247 11/26/2017    Antibiotics  :    Anti-infectives (From admission, onward)   None        Objective:   Vitals:   11/27/17 2134 11/28/17 0459 11/28/17 0504 11/28/17 1300  BP: (!) 104/56  112/64 115/68  Pulse: 65  66 67  Resp: 19  19 17   Temp: 98.3 F (36.8 C)  (!) 97.5 F (36.4 C) 97.7 F (36.5 C)  TempSrc: Oral  Oral Oral  SpO2: 97%  99% 99%  Weight:  45.7 kg (100 lb 12 oz) 44.6 kg (98 lb 5.2 oz)   Height:        Wt Readings from Last 3 Encounters:  11/28/17 44.6 kg (98 lb 5.2 oz)  11/16/17 48.5 kg (107 lb)  11/13/17 48.5 kg (107 lb)     Intake/Output Summary (Last 24 hours) at 11/28/2017 1646 Last data filed at 11/27/2017 2135 Gross per 24 hour  Intake 480 ml  Output 450 ml  Net 30 ml     Physical Exam General: Elderly thin built female, appears fatigued, not in distress HEENT: Moist mucosa, supple neck Chest: Clear bilaterally CVS: Normal S1 and S2, no murmurs GI: Soft, nondistended, nontender Musculoskeletal: Warm, no edema         Data Review:    CBC Recent Labs  Lab 11/23/17 0115 11/24/17 0550 11/25/17 0440 11/26/17 0757  WBC 5.9 14.8* 9.1 6.7  HGB 11.2* 12.1 12.0 11.0*  HCT 35.4* 38.3 37.6 35.1*  PLT 179 186 241 247  MCV 86.1 85.1 85.6 85.6  MCH 27.3 26.9 27.3 26.8  MCHC 31.6 31.6 31.9 31.3  RDW 14.1 14.3 14.2 14.1  LYMPHSABS 1.1  --   --   --   MONOABS 0.6  --   --   --   EOSABS 0.0  --   --   --   BASOSABS 0.0  --   --   --     Chemistries  Recent Labs  Lab 11/22/17  2334  11/24/17 0550 11/25/17 0440 11/26/17 0757 11/27/17 0546 11/28/17 0540  NA 137   < > 135 134* 133* 134* 133*  K 4.5   < > 5.6* 5.2* 4.1 4.7 4.3  CL 106   < > 101 101 97* 95* 93*  CO2 19*   < > 17* 17* 23 23 24   GLUCOSE 145*   < > 134* 100* 104* 160* 98  BUN 42*   < > 55* 64* 67* 71* 78*  CREATININE 2.08*   < > 2.86* 3.22* 2.86* 3.03* 3.16*   CALCIUM 9.5   < > 9.5 9.2 8.7* 9.3 9.3  MG 1.5*  --   --   --   --   --   --   AST 26  --   --   --   --   --   --   ALT 23  --   --   --   --   --   --   ALKPHOS 128*  --   --   --   --   --   --   BILITOT 1.6*  --   --   --   --   --   --    < > = values in this interval not displayed.   ------------------------------------------------------------------------------------------------------------------ No results for input(s): CHOL, HDL, LDLCALC, TRIG, CHOLHDL, LDLDIRECT in the last 72 hours.  Lab Results  Component Value Date   HGBA1C 5.5 06/15/2016   ------------------------------------------------------------------------------------------------------------------ No results for input(s): TSH, T4TOTAL, T3FREE, THYROIDAB in the last 72 hours.  Invalid input(s): FREET3 ------------------------------------------------------------------------------------------------------------------ No results for input(s): VITAMINB12, FOLATE, FERRITIN, TIBC, IRON, RETICCTPCT in the last 72 hours.  Coagulation profile No results for input(s): INR, PROTIME in the last 168 hours.  No results for input(s): DDIMER in the last 72 hours.  Cardiac Enzymes Recent Labs  Lab 11/23/17 0248 11/23/17 0948 11/23/17 1507  TROPONINI 0.08* 0.09* 0.09*   ------------------------------------------------------------------------------------------------------------------    Component Value Date/Time   BNP >4,500.0 (H) 11/22/2017 2334    Inpatient Medications  Scheduled Meds: . aspirin EC  81 mg Oral Daily  . heparin  5,000 Units Subcutaneous Q8H  . insulin aspart  0-5 Units Subcutaneous QHS  . insulin aspart  0-9 Units Subcutaneous TID WC  . metoprolol succinate  12.5 mg Oral Daily  . torsemide  20 mg Oral Daily   Continuous Infusions:  PRN Meds:.acetaminophen **OR** acetaminophen, diphenhydrAMINE, hydrocortisone, hydrocortisone cream, ondansetron **OR** ondansetron (ZOFRAN) IV,  traMADol-acetaminophen  Micro Results Recent Results (from the past 240 hour(s))  MRSA PCR Screening     Status: None   Collection Time: 11/23/17  6:21 AM  Result Value Ref Range Status   MRSA by PCR NEGATIVE NEGATIVE Final    Comment:        The GeneXpert MRSA Assay (FDA approved for NASAL specimens only), is one component of a comprehensive MRSA colonization surveillance program. It is not intended to diagnose MRSA infection nor to guide or monitor treatment for MRSA infections. Performed at [facility], [address] [place], [place] [address]     Radiology Reports Dg Chest 1 View  Result Date: 11/24/2017 CLINICAL DATA:  Shortness of breath.  CHF. EXAM: CHEST 1 VIEW COMPARISON:  Chest x-ray 11/22/2017. FINDINGS: Cardiomegaly with normal pulmonary vascularity. Mild bilateral interstitial prominence and small left-sided pleural effusion. Findings suggest mild CHF. No acute bony abnormality. Carotid vascular calcification. IMPRESSION: 1. Congestive heart failure with mild bilateral pulmonary interstitial edema and  small left pleural effusion. 2.  Carotid vascular disease. Electronically Signed   By: Maisie Fus  Register   On: 11/24/2017 15:22   Dg Chest 2 View  Result Date: 11/22/2017 CLINICAL DATA:  84 year old female with shortness of breath. EXAM: CHEST  2 VIEW COMPARISON:  Chest radiograph dated 11/01/2017 FINDINGS: Small left pleural effusion similar or slightly increased compared to the prior radiograph with associated compressive atelectasis of the left lung base. Superimposed pneumonia or underlying mass is not excluded. Clinical correlation is recommended. The right lung is clear. There is no pneumothorax. Stable cardiomegaly. Atherosclerotic calcification of the aortic arch. No acute osseous pathology. Osteopenia and degenerative changes of the spine. A pin is noted in the soft tissues of the left axilla. IMPRESSION: Small left pleural effusion similar or slightly increased since  the prior radiograph. Clinical correlation and continued follow-up recommended. Cardiomegaly. Pin in the soft tissues of the left axilla and posterior upper arm. This may be related to overlying dressing. Clinical correlation is recommended. Electronically Signed   By: Elgie Collard M.D.   On: 11/22/2017 22:28   Dg Chest 2 View  Result Date: 11/01/2017 CLINICAL DATA:  Chest pain.  Shortness of breath. EXAM: CHEST  2 VIEW COMPARISON:  October 29, 2017 FINDINGS: Small left effusion and associated infiltrate in the left base. No other interval changes or acute abnormalities. IMPRESSION: Small left effusion and associated infiltrate in the lateral left lung base. Recommend follow-up to resolution. Electronically Signed   By: Gerome Sam III M.D   On: 11/01/2017 18:54   Dg Chest 2 View  Result Date: 10/29/2017 CLINICAL DATA:  Per ED notes: Patient released from hospital on Wednesday , admit for CHF. Patient reports SOB and chest pain. No chest pain currently EXAM: CHEST  2 VIEW COMPARISON:  10/16/2017 FINDINGS: The heart is enlarged. There is mild interstitial pulmonary edema. Bilateral pleural effusions, left greater than right. There has been some improvement in the appearance of airspace filling opacities and consolidation in the left lower lobe. IMPRESSION: 1. Improved appearance of left lower lobe consolidation. 2. Cardiomegaly and mild edema persist. 3. Bilateral pleural effusions left greater than right. Electronically Signed   By: Norva Pavlov M.D.   On: 10/29/2017 20:56   US Renal  Result Date: 11/24/2017 CLINICAL DATA:  ATN EXAM: RENAL / URINARY TRACT ULTRASOUND COMPLETE COMPARISON:  10/16/2017 FINDINGS: Right Kidney: Length: 10.6 cm. 3.6 x 3.3 x 3.6 cm upper pole simple cyst. No hydronephrosis. Left Kidney: Not discretely visualized in this patient with known left renal atrophy. Bladder: Decompressed by indwelling Foley catheter. Additional comments: Small left pleural effusion. IMPRESSION:  3.6 cm right upper pole renal cyst, simple.  No hydronephrosis. Left kidney not discretely visualized in this patient with known left renal atrophy. Bladder decompressed by indwelling Foley catheter. Electronically Signed   By: Charline Bills M.D.   On: 11/24/2017 16:26    Time Spent in minutes  25   Lori Liew M.D on 11/28/2017 at 4:46 PM  Between 7am to 7pm - Pager - [phone]  After 7pm go to [email or web address] - password [facility]  [facility] -  Office  [phone]

## 2017-11-28 NOTE — Clinical Social Work Note (Signed)
Clinical Social Work Assessment  Patient Details  Name: [name] [name] MRN: [id number] Date of Birth: 09-Jun-1924  Date of referral:  11/28/17               Reason for consult:  Facility Placement                Permission sought to share information with:    Permission granted to share information::     Name::        Agency::     Relationship::     Contact Information:  dtr, [place] Dom.   Housing/Transportation Living arrangements for the past 2 months:  Apartment Source of Information:  Adult Children Patient Interpreter Needed:  None Criminal Activity/Legal Involvement Pertinent to Current Situation/Hospitalization:  No - Comment as needed Significant Relationships:  Adult Children Lives with:  Self Do you feel safe going back to the place where you live?  Yes Need for family participation in patient care:  Yes (Comment)  Care giving concerns:  None identified.   Social Worker assessment / plan:  Patient lives alone at [employer] (senior apartments). She ambulates with a walker and has ongoing HH.  Patient is agreeable to STR at SNF.   Employment status:  Retired Database administrator, [employer] [place] PT Recommendations:  Skilled Nursing Facility Information / Referral to community resources:  Skilled Nursing Facility  Patient/Family's Response to care:  Patient and family are agreeable to STR at [facility].   Patient/Family's Understanding of and Emotional Response to Diagnosis, Current Treatment, and Prognosis:  Patient and daughter understand patient's diagnosis, treatment and prognosis.   Emotional Assessment Appearance:  Appears stated age Attitude/Demeanor/Rapport:    Affect (typically observed):  Accepting, Calm Orientation:  Oriented to Self, Oriented to Place Alcohol / Substance use:  Not Applicable Psych involvement (Current and /or in the community):  No (Comment)  Discharge Needs  Concerns to be addressed:  Discharge  Planning Concerns Readmission within the last 30 days:  Yes Current discharge risk:  None Barriers to Discharge:  No Barriers Identified   Annice Needy, LCSW 11/28/2017, 4:02 PM

## 2017-11-28 NOTE — [place] FL2 (Signed)
[place] MEDICAID FL2 LEVEL OF CARE SCREENING TOOL     IDENTIFICATION  Patient Name: [name] [name] Birthdate: 13-Jul-1924 Sex: female Admission Date (Current Location): 11/22/2017  [place] and [place] Number:  Aaron Edelman [id number] P Facility and Address:  [facility],  [address] [place] [address]      Provider Number: [id number]  Attending Physician Name and Address:  Eddie North, MD  Relative Name and Phone Number:       Current Level of Care: Hospital Recommended Level of Care: Skilled Nursing Facility Prior Approval Number:    Date Approved/Denied:   PASRR Number: [id number])  Discharge Plan: SNF    Current Diagnoses: Patient Active Problem List   Diagnosis Date Noted  . Goals of care, counseling/discussion   . Palliative care by specialist   . Encounter for hospice care discussion   . Protein-calorie malnutrition, severe 11/24/2017  . CKD (chronic kidney disease), stage III (HCC) 11/23/2017  . CHF (congestive heart failure) (HCC) 11/23/2017  . CHF exacerbation (HCC) 11/01/2017  . Acute on chronic combined systolic and diastolic CHF (congestive heart failure) (HCC) 10/15/2017  . Nonischemic cardiomyopathy (HCC) 10/15/2017  . Coronary artery disease 10/15/2017  . Chest pain 07/20/2017  . Type II diabetes mellitus (HCC)   . PE 10/01/2009  . Difficulty walking 10/01/2009  . Dysthymic disorder 07/06/2009  . UNSPECIFIED CHRONIC ISCHEMIC HEART DISEASE 07/06/2009  . Left bundle branch block (LBBB) 07/06/2009  . Coronary atherosclerosis 12/12/2007  . BACK PAIN, THORACIC REGION, CHRONIC 12/12/2007  . Hyperlipidemia 09/11/2007  . CATARACTS 09/11/2007  . Essential hypertension 09/11/2007  . OVERACTIVE BLADDER 09/11/2007  . Arthropathy 09/11/2007    Orientation RESPIRATION BLADDER Height & Weight     Self, Place  Normal Continent Weight: 98 lb 5.2 oz (44.6 kg) Height:  5\' 3"  (160 cm)  BEHAVIORAL SYMPTOMS/MOOD NEUROLOGICAL  BOWEL NUTRITION STATUS      Continent Diet(heart healthy/carb modified)  AMBULATORY STATUS COMMUNICATION OF NEEDS Skin   Limited Assist Verbally Normal                       Personal Care Assistance Level of Assistance  Dressing, Feeding, Bathing Bathing Assistance: Limited assistance Feeding assistance: Independent Dressing Assistance: Limited assistance Total Care Assistance: (N/A)   Functional Limitations Info  Sight, Hearing, Speech Sight Info: Adequate Hearing Info: Adequate Speech Info: Adequate    SPECIAL CARE FACTORS FREQUENCY  PT (By licensed PT)     PT Frequency: 5x/week               Contractures Contractures Info: Not present    Additional Factors Info  Code Status, Allergies, Isolation Precautions Code Status Info: DNR Allergies Info: NKA Psychotropic Info: n/a   Isolation Precautions Info: Contact Precautions. Patient is being treated for scabies.      Current Medications (11/28/2017):  This is the current hospital active medication list Current Facility-Administered Medications  Medication Dose Route Frequency Provider Last Rate Last Dose  . acetaminophen (TYLENOL) tablet 650 mg  650 mg Oral Q6H PRN Sherryll Burger, Pratik D, DO       Or  . acetaminophen (TYLENOL) suppository 650 mg  650 mg Rectal Q6H PRN Sherryll Burger, Pratik D, DO      . aspirin EC tablet 81 mg  81 mg Oral Daily Sherryll Burger, Pratik D, DO   81 mg at 11/28/17 0945  . diphenhydrAMINE (BENADRYL) 12.5 MG/5ML elixir 12.5 mg  12.5 mg Oral Q8H PRN Dhungel, Nishant, MD      .  heparin injection 5,000 Units  5,000 Units Subcutaneous Q8H Shah, Pratik D, DO   5,000 Units at 11/28/17 1454  . hydrocortisone (ANUSOL-HC) 2.5 % rectal cream 1 application  1 application Topical BID PRN Sherryll Burger, Pratik D, DO      . hydrocortisone cream 1 % 1 application  1 application Topical TID PRN Sherryll Burger, Pratik D, DO      . insulin aspart (novoLOG) injection 0-5 Units  0-5 Units Subcutaneous QHS Shah, Pratik D, DO      . insulin aspart  (novoLOG) injection 0-9 Units  0-9 Units Subcutaneous TID WC Shah, Pratik D, DO   1 Units at 11/27/17 1223  . metoprolol succinate (TOPROL-XL) 24 hr tablet 12.5 mg  12.5 mg Oral Daily Sherryll Burger, Pratik D, DO   12.5 mg at 11/28/17 0945  . ondansetron (ZOFRAN) tablet 4 mg  4 mg Oral Q6H PRN Sherryll Burger, Pratik D, DO   4 mg at 11/23/17 1818   Or  . ondansetron (ZOFRAN) injection 4 mg  4 mg Intravenous Q6H PRN Sherryll Burger, Pratik D, DO   4 mg at 11/24/17 0513  . torsemide (DEMADEX) tablet 20 mg  20 mg Oral Daily Salomon Mast, MD   20 mg at 11/28/17 0945  . traMADol-acetaminophen (ULTRACET) 37.5-325 MG per tablet 1 tablet  1 tablet Oral Q8H PRN Dhungel, Nishant, MD   1 tablet at 11/27/17 1405     Discharge Medications: Please see discharge summary for a list of discharge medications.  Relevant Imaging Results:  Relevant Lab Results:   Additional Information SSN [id number] Juleen China, LCSW

## 2017-11-28 NOTE — Progress Notes (Signed)
Subjective: Interval History: Patient feels hungry this morning.  Denies any difficulty breathing.  Still she has some cough.  Objective: Vital signs in last 24 hours: Temp:  [97.5 F (36.4 C)-98.3 F (36.8 C)] 97.5 F (36.4 C) (03/05 0504) Pulse Rate:  [60-66] 66 (03/05 0504) Resp:  [19] 19 (03/05 0504) BP: (104-112)/(56-73) 112/64 (03/05 0504) SpO2:  [95 %-99 %] 99 % (03/05 0504) Weight:  [44.6 kg (98 lb 5.2 oz)-45.7 kg (100 lb 12 oz)] 44.6 kg (98 lb 5.2 oz) (03/05 0504) Weight change: 0.2 kg (7.1 oz)  Intake/Output from previous day: 03/04 0701 - 03/05 0700 In: 960 [P.O.:960] Out: 450 [Urine:450] Intake/Output this shift: No intake/output data recorded.  General appearance: alert, cooperative and no distress Resp: diminished breath sounds bilaterally Cardio: regular rate and rhythm Extremities: No edema  Lab Results: Recent Labs    11/26/17 0757  WBC 6.7  HGB 11.0*  HCT 35.1*  PLT 247   BMET:  Recent Labs    11/27/17 0546 11/28/17 0540  NA 134* 133*  K 4.7 4.3  CL 95* 93*  CO2 23 24  GLUCOSE 160* 98  BUN 71* 78*  CREATININE 3.03* 3.16*  CALCIUM 9.3 9.3   No results for input(s): PTH in the last 72 hours. Iron Studies: No results for input(s): IRON, TIBC, TRANSFERRIN, FERRITIN in the last 72 hours.  Studies/Results: No results found.  I have reviewed the patient's current medications.  Assessment/Plan: 1] acute kidney injury superimposed on chronic: Possibly prerenal/ATN/cardiorenal.  Her renal function is now showing any sign of improvement.  Patient however is asymptomatic. 2] chronic renal failure: Stage IV.  Possibly a combination of hypertension/renal artery stenosis [patient had right atrophic kidney]. 3] hyperkalemia: Her potassium remains normal. 4] low CO2: Possibly metabolic.  Her CO2 is 24 reasonable 5] history of combined systolic and diastolic heart failure.  Patient is on Demadex and had 400 cc of urine output.  Presently she is a  symptomatic. 6] hypertension: Her blood pressure is reasonably controlled 7] diabetes: Her random blood sugar is reasonable. 8] history of PE Plan: 1] we will continue his present management.  I have discussed also with her daughter who was with her yesterday and this morning. 2] we will check a renal panel in the morning    LOS: 5 days   Dian Laprade S 11/28/2017,8:12 AM

## 2017-11-29 DIAGNOSIS — R5381 Other malaise: Secondary | ICD-10-CM

## 2017-11-29 DIAGNOSIS — E43 Unspecified severe protein-calorie malnutrition: Secondary | ICD-10-CM

## 2017-11-29 DIAGNOSIS — E1122 Type 2 diabetes mellitus with diabetic chronic kidney disease: Secondary | ICD-10-CM

## 2017-11-29 DIAGNOSIS — R0602 Shortness of breath: Secondary | ICD-10-CM

## 2017-11-29 DIAGNOSIS — B86 Scabies: Secondary | ICD-10-CM

## 2017-11-29 LAB — RENAL FUNCTION PANEL
ANION GAP: 13 (ref 5–15)
Albumin: 3 g/dL — ABNORMAL LOW (ref 3.5–5.0)
BUN: 77 mg/dL — ABNORMAL HIGH (ref 6–20)
CALCIUM: 9.2 mg/dL (ref 8.9–10.3)
CO2: 28 mmol/L (ref 22–32)
CREATININE: 2.73 mg/dL — AB (ref 0.44–1.00)
Chloride: 94 mmol/L — ABNORMAL LOW (ref 101–111)
GFR calc non Af Amer: 14 mL/min — ABNORMAL LOW (ref >60)
GFR, EST AFRICAN AMERICAN: 16 mL/min — AB (ref >60)
Glucose, Bld: 116 mg/dL — ABNORMAL HIGH (ref 65–99)
PHOSPHORUS: 3.9 mg/dL (ref 2.5–4.6)
Potassium: 3.8 mmol/L (ref 3.5–5.1)
SODIUM: 135 mmol/L (ref 135–145)

## 2017-11-29 LAB — GLUCOSE, CAPILLARY
Glucose-Capillary: 101 mg/dL — ABNORMAL HIGH (ref 65–99)
Glucose-Capillary: 108 mg/dL — ABNORMAL HIGH (ref 65–99)
Glucose-Capillary: 172 mg/dL — ABNORMAL HIGH (ref 65–99)

## 2017-11-29 MED ORDER — TORSEMIDE 20 MG PO TABS: 20.0000 mg | ORAL_TABLET | Freq: Every day | ORAL | Status: AC

## 2017-11-29 MED ORDER — ACETAMINOPHEN 325 MG PO TABS: 650.0000 mg | ORAL_TABLET | Freq: Four times a day (QID) | ORAL | Status: AC | PRN

## 2017-11-29 MED ORDER — HYDROXYZINE PAMOATE 25 MG PO CAPS: 25.0000 mg | ORAL_CAPSULE | Freq: Two times a day (BID) | ORAL | 0 refills | Status: AC | PRN

## 2017-11-29 MED ORDER — TRAMADOL-ACETAMINOPHEN 37.5-325 MG PO TABS: 1.0000 | ORAL_TABLET | Freq: Three times a day (TID) | ORAL | 0 refills | Status: AC | PRN

## 2017-11-29 NOTE — Progress Notes (Signed)
Physical Therapy Treatment Patient Details Name: [name] [name] MRN: [id number] DOB: November 14, 1923 Today's Date: 11/29/2017    History of Present Illness [name] [name] is a 84 y.o. female with medical history significant for CAD, chronic combined systolic and diastolic CHF with EF 15-20%, stage III-IV CKD, known left bundle branch block, hypertension, dyslipidemia, and prior CVA who presents to the emergency department with some palpitations along with dyspnea.  She has recently seen her cardiologist Dr. Wyline Mood on 2/18 and was noted to require an increase Lasix dose of 40 mg which she claims to be taking.  This was done in the office shortly after a couple admissions earlier this month for CHF exacerbation, where she left AMA on 2/7.  Additionally she was recently seen in the ED on 2/21 as well as 2/23 and was noted to have significant itching for which she was diagnosed with scabies, but has not started treatment with Permethrin cream.     PT Comments    Patient demonstrates slow labored movement for sitting up and during gait training with frequent c/o itching, while walking once fatigued patient tends to lean on side rail in hallway for support, becomes more unsteady and was incontinent of urine before making it back to bedside.  Patient tolerated sitting up in chair with with family member present after therapy.  Patient will benefit from continued physical therapy in hospital and recommended venue below to increase strength, balance, endurance for safe ADLs and gait.    Follow Up Recommendations  SNF     Equipment Recommendations  None recommended by PT    Recommendations for Other Services       Precautions / Restrictions Precautions Precautions: Fall Restrictions Weight Bearing Restrictions: No    Mobility  Bed Mobility Overal bed mobility: Needs Assistance Bed Mobility: Sit to Supine     Supine to sit: Min assist        Transfers Overall transfer level: Needs  assistance Equipment used: Rolling walker (2 wheeled) Transfers: Sit to/from [employer] Sit to Stand: Min assist Stand pivot transfers: Min assist          Ambulation/Gait Ambulation/Gait assistance: Min guard Ambulation Distance (Feet): 40 Feet Assistive device: Rolling walker (2 wheeled) Gait Pattern/deviations: Decreased step length - right;Decreased step length - left;Decreased stride length   Gait velocity interpretation: Below normal speed for age/gender General Gait Details: Patient demonstrates unsteady slow labored cadence with 1 occasion of leaning on siderail for support once fatigued, became incontinent of urine before making it back to room   Stairs            Wheelchair Mobility    Modified Rankin (Stroke Patients Only)       Balance Overall balance assessment: Needs assistance Sitting-balance support: Feet supported;No upper extremity supported Sitting balance-Leahy Scale: Fair Sitting balance - Comments: fair/good   Standing balance support: Bilateral upper extremity supported;During functional activity Standing balance-Leahy Scale: Fair Standing balance comment: fair with RW                            Cognition Arousal/Alertness: Awake/alert Behavior During Therapy: WFL for tasks assessed/performed Overall Cognitive Status: Within Functional Limits for tasks assessed                                        Exercises General Exercises - Lower Extremity  Ankle Circles/Pumps: AROM;Seated;Strengthening;Both;10 reps Long Arc Quad: Seated;AROM;Strengthening;Both;10 reps Hip Flexion/Marching: Seated;AROM;Strengthening;Both;10 reps    General Comments        Pertinent Vitals/Pain Pain Assessment: No/denies pain    Home Living                      Prior Function            PT Goals (current goals can now be found in the care plan section) Acute Rehab PT Goals Patient Stated Goal: return  home PT Goal Formulation: With patient/family Time For Goal Achievement: 12/11/17 Potential to Achieve Goals: Good Progress towards PT goals: Progressing toward goals    Frequency    Min 3X/week      PT Plan Current plan remains appropriate    Co-evaluation              AM-PAC PT "6 Clicks" Daily Activity  Outcome Measure  Difficulty turning over in bed (including adjusting bedclothes, sheets and blankets)?: None Difficulty moving from lying on back to sitting on the side of the bed? : A Little Difficulty sitting down on and standing up from a chair with arms (e.g., wheelchair, bedside commode, etc,.)?: A Little Help needed moving to and from a bed to chair (including a wheelchair)?: A Little Help needed walking in hospital room?: A Little Help needed climbing 3-5 steps with a railing? : A Lot 6 Click Score: 18    End of Session   Activity Tolerance: Patient tolerated treatment well;Patient limited by fatigue Patient left: in chair;with call bell/phone within reach;with family/visitor present Nurse Communication: Mobility status;Other (comment) PT Visit Diagnosis: Unsteadiness on feet (R26.81);Other abnormalities of gait and mobility (R26.89);Other symptoms and signs involving the nervous system (R29.898)     Time: 5035-4656 PT Time Calculation (min) (ACUTE ONLY): 23 min  Charges:  $Therapeutic Activity: 23-37 mins                    G Codes:       11:53 AM, 12-18-17 Ocie Bob, MPT Physical Therapist with [facility] [phone] office [phone] mobile phone

## 2017-11-29 NOTE — Discharge Summary (Signed)
Physician Discharge Summary  [name] [name] [facility] ZOX:[id number] DOB: 1924-01-14 DOA: 11/22/2017  PCP: Kirstie Peri, MD  Admit date: 11/22/2017 Discharge date: 11/29/2017  Time spent: 35 minutes  Recommendations for Outpatient Follow-up:  1. Follow-up volume status and further adjust diuretics as needed 2. Repeat basic metabolic panel to further assess renal function and electrolytes (in 7 days).   Discharge Diagnoses:  Principal Problem:   Acute on chronic combined systolic and diastolic CHF (congestive heart failure) (HCC) Active Problems:   Essential hypertension   Left bundle branch block (LBBB)   Type II diabetes mellitus (HCC)   Coronary artery disease   CKD (chronic kidney disease), stage III (HCC)   CHF (congestive heart failure) (HCC)   Protein-calorie malnutrition, severe   Goals of care, counseling/discussion   Palliative care by specialist   Encounter for hospice care discussion   Scabies   SOB (shortness of breath)   Physical deconditioning   Discharge Condition: Stable.  Patient discharged to skilled nursing facility for further care and attempt rehabilitation.  Outpatient follow-up with renal service based on further progression of her conditions.  Main plan is to focus on symptom management and palliation of her conditions without invasive interventions or heroic measurements.   Diet recommendation: Heart healthy diet and modified carbohydrate.  Filed Weights   11/28/17 0459 11/28/17 0504 11/29/17 0604  Weight: 45.7 kg (100 lb 12 oz) 44.6 kg (98 lb 5.2 oz) 44.9 kg (98 lb 15.8 oz)    History of present illness:  As per the H&P written by Dr. Sherryll Burger on 11/23/17 84 y.o. female with medical history significant for CAD, chronic combined systolic and diastolic CHF with EF 15-20%, stage III-IV CKD, known left bundle branch block, hypertension, dyslipidemia, and prior CVA who presents to the emergency department with some palpitations along with dyspnea.  She has recently seen  her cardiologist Dr. Wyline Mood on 2/18 and was noted to require an increase Lasix dose of 40 mg which she claims to be taking.  This was done in the office shortly after a couple admissions earlier this month for CHF exacerbation, where she left AMA on 2/7.  Additionally she was recently seen in the ED on 2/21 as well as 2/23 and was noted to have significant itching for which she was diagnosed with scabies, but has not started treatment with Permethrin cream.  She denies any chest pain, or diaphoresis. She did have some mild nausea but no vomiting noted. She has mild lower extremity edema along with dyspnea on exertion.    Hospital Course:  1-acute on chronic combined systolic and diastolic CHF -Patient with recurrent hospitalization in the setting of CHF exacerbation -Last ejection fraction 15-20% -Probably diuresis and is stable at the moment of discharge -After goals of care discussion decision has been made for noninvasive treatment or intervention and to focus on symptoms palliation. -Patient diuretic has been adjusted to Demadex 20 mg daily and advise to follow low sodium diet  -patient discharge to SNF for attempted rehab and transition to full comfort care, if she continues declining.  2-acute on chronic renal failure: stage 3 at baseline -Due to further progression of her chronic renal failure most likely associated with underlying diabetes. -Acute decompensation attributed to cardiorenal syndrome versus ATN. Patient with improvement/stabilization after diuresis. -She will be discharged on daily torsemide and will follow-up as an outpatient with renal service based on further progression of her underlying conditions.  3-history of coronary artery disease -Nonobstructive coronary artery disease in 2004. -Troponin  mildly elevated in the setting of demand ischemia from CHF exacerbation. -Continue low-dose metoprolol -At discharge heart rate is a stable and well-controlled.  4-moderate  MR/TR -Not a candidate for further intervention.  5-type 2 diabetes mellitus with nephropathy -Continue modified carbohydrates -CBGs stable. -not using any hypoglycemic regimen at this point   6-hyperkalemia -Probably in the setting of worsening renal failure -Improved and stabilized with diuresis  7-severe protein calorie malnutrition -Patient encouraged to maintain adequate nutrition and hydration. -Nutrition service was consulted during hospitalization -Patient will follow feeding supplements recommendations.  8-hx of Scabies -treated with permethrin  -continue atarax for itching  -follow good hygiene  9-goals of care: -Based on discussion with palliative care services, decision has been made to focus on symptom management and palliation.  Not invasive intervention or heroic measures.  She will be discharged to skilled nursing facility for rehabilitation attempt and to continue current medications, with avoidance of future hospitalizations and transition to full comfort in case of further declining.   Procedures:  See below for x-ray reports.  Consultations:  Renal service  Palliative care  Cardiology service  Discharge Exam: Vitals:   11/28/17 2158 11/29/17 0604  BP: 120/71 122/68  Pulse: 62 60  Resp: 18 18  Temp: 98.4 F (36.9 C) 98.3 F (36.8 C)  SpO2: 94% 93%    General: Afebrile, denies chest pain, no shortness of breath.  Elderly, frail and without acute distress. Cardiovascular: RRR, no rubs, no gallops, positive murmur on exam. Respiratory: Normal respiratory effort, no tachypnea, no requiring oxygen supplementation. Abdomen: Soft, nontender, positive bowel sounds Extremities: No edema, no cyanosis  Discharge Instructions   Discharge Instructions    (HEART FAILURE PATIENTS) Call MD:  Anytime you have any of the following symptoms: 1) 3 pound weight gain in 24 hours or 5 pounds in 1 week 2) shortness of breath, with or without a dry hacking cough  3) swelling in the hands, feet or stomach 4) if you have to sleep on extra pillows at night in order to breathe.   Complete by:  As directed    Diet - low sodium heart healthy   Complete by:  As directed    Discharge instructions   Complete by:  As directed    Heart healthy diet (less than 2 g of sodium per day) Medications as prescribed Sure appropriate hydration Follow symptoms and based on response transition to full comfort care only, with plans for no further hospitalization if possible.     Allergies as of 11/29/2017   No Known Allergies     Medication List    STOP taking these medications   furosemide 40 MG tablet Commonly known as:  LASIX   hydrocortisone cream 1 %   permethrin 5 % cream Commonly known as:  ELIMITE     TAKE these medications   acetaminophen 325 MG tablet Commonly known as:  TYLENOL Take 2 tablets (650 mg total) by mouth every 6 (six) hours as needed for mild pain or headache (or Fever >/= 101).   aspirin 81 MG EC tablet Take 1 tablet (81 mg total) by mouth daily.   hydrocortisone 2.5 % rectal cream Commonly known as:  ANUSOL-HC Apply 1 application topically 2 (two) times daily as needed for hemorrhoids.   hydrOXYzine 25 MG capsule Commonly known as:  VISTARIL Take 1 capsule (25 mg total) by mouth every 12 (twelve) hours as needed for itching. 1 at hs for itching. What changed:    how much to  take  how to take this  when to take this  reasons to take this   metoprolol succinate 25 MG 24 hr tablet Commonly known as:  TOPROL-XL Take 0.5 tablets (12.5 mg total) by mouth daily.   torsemide 20 MG tablet Commonly known as:  DEMADEX Take 1 tablet (20 mg total) by mouth daily. Start taking on:  11/30/2017   traMADol-acetaminophen 37.5-325 MG tablet Commonly known as:  ULTRACET Take 1 tablet by mouth every 8 (eight) hours as needed for severe pain.      No Known Allergies Contact information for after-discharge care    Destination     [facility] Follow up.   Service:  Skilled Nursing Contact information: [address] [place] [address] [phone]              The results of significant diagnostics from this hospitalization (including imaging, microbiology, ancillary and laboratory) are listed below for reference.    Significant Diagnostic Studies: Dg Chest 1 View  Result Date: 11/24/2017 CLINICAL DATA:  Shortness of breath.  CHF. EXAM: CHEST 1 VIEW COMPARISON:  Chest x-ray 11/22/2017. FINDINGS: Cardiomegaly with normal pulmonary vascularity. Mild bilateral interstitial prominence and small left-sided pleural effusion. Findings suggest mild CHF. No acute bony abnormality. Carotid vascular calcification. IMPRESSION: 1. Congestive heart failure with mild bilateral pulmonary interstitial edema and small left pleural effusion. 2.  Carotid vascular disease. Electronically Signed   By: Maisie Fus  Register   On: 11/24/2017 15:22   Dg Chest 2 View  Result Date: 11/22/2017 CLINICAL DATA:  84 year old female with shortness of breath. EXAM: CHEST  2 VIEW COMPARISON:  Chest radiograph dated 11/01/2017 FINDINGS: Small left pleural effusion similar or slightly increased compared to the prior radiograph with associated compressive atelectasis of the left lung base. Superimposed pneumonia or underlying mass is not excluded. Clinical correlation is recommended. The right lung is clear. There is no pneumothorax. Stable cardiomegaly. Atherosclerotic calcification of the aortic arch. No acute osseous pathology. Osteopenia and degenerative changes of the spine. A pin is noted in the soft tissues of the left axilla. IMPRESSION: Small left pleural effusion similar or slightly increased since the prior radiograph. Clinical correlation and continued follow-up recommended. Cardiomegaly. Pin in the soft tissues of the left axilla and posterior upper arm. This may be related to overlying dressing. Clinical correlation is  recommended. Electronically Signed   By: Elgie Collard M.D.   On: 11/22/2017 22:28   Dg Chest 2 View  Result Date: 11/01/2017 CLINICAL DATA:  Chest pain.  Shortness of breath. EXAM: CHEST  2 VIEW COMPARISON:  October 29, 2017 FINDINGS: Small left effusion and associated infiltrate in the left base. No other interval changes or acute abnormalities. IMPRESSION: Small left effusion and associated infiltrate in the lateral left lung base. Recommend follow-up to resolution. Electronically Signed   By: Gerome Sam III M.D   On: 11/01/2017 18:54   US Renal  Result Date: 11/24/2017 CLINICAL DATA:  ATN EXAM: RENAL / URINARY TRACT ULTRASOUND COMPLETE COMPARISON:  10/16/2017 FINDINGS: Right Kidney: Length: 10.6 cm. 3.6 x 3.3 x 3.6 cm upper pole simple cyst. No hydronephrosis. Left Kidney: Not discretely visualized in this patient with known left renal atrophy. Bladder: Decompressed by indwelling Foley catheter. Additional comments: Small left pleural effusion. IMPRESSION: 3.6 cm right upper pole renal cyst, simple.  No hydronephrosis. Left kidney not discretely visualized in this patient with known left renal atrophy. Bladder decompressed by indwelling Foley catheter. Electronically Signed   By: Lurlean Horns  Rito Ehrlich M.D.   On: 11/24/2017 16:26    Microbiology: Recent Results (from the past 240 hour(s))  MRSA PCR Screening     Status: None   Collection Time: 11/23/17  6:21 AM  Result Value Ref Range Status   MRSA by PCR NEGATIVE NEGATIVE Final    Comment:        The GeneXpert MRSA Assay (FDA approved for NASAL specimens only), is one component of a comprehensive MRSA colonization surveillance program. It is not intended to diagnose MRSA infection nor to guide or monitor treatment for MRSA infections. Performed at [facility], [address], [place], [place] [address]      Labs: Basic Metabolic Panel: Recent Labs  Lab 11/22/17 2334  11/25/17 0440 11/26/17 0757 11/27/17 0546 11/28/17 0540  11/29/17 0548  NA 137   < > 134* 133* 134* 133* 135  K 4.5   < > 5.2* 4.1 4.7 4.3 3.8  CL 106   < > 101 97* 95* 93* 94*  CO2 19*   < > 17* 23 23 24 28   GLUCOSE 145*   < > 100* 104* 160* 98 116*  BUN 42*   < > 64* 67* 71* 78* 77*  CREATININE 2.08*   < > 3.22* 2.86* 3.03* 3.16* 2.73*  CALCIUM 9.5   < > 9.2 8.7* 9.3 9.3 9.2  MG 1.5*  --   --   --   --   --   --   PHOS  --   --  5.2* 4.6 4.7* 4.6 3.9   < > = values in this interval not displayed.   Liver Function Tests: Recent Labs  Lab 11/22/17 2334 11/26/17 0757 11/27/17 0546 11/28/17 0540 11/29/17 0548  AST 26  --   --   --   --   ALT 23  --   --   --   --   ALKPHOS 128*  --   --   --   --   BILITOT 1.6*  --   --   --   --   PROT 5.8*  --   --   --   --   ALBUMIN 3.2* 2.8* 2.9* 2.9* 3.0*   CBC: Recent Labs  Lab 11/23/17 0115 11/24/17 0550 11/25/17 0440 11/26/17 0757  WBC 5.9 14.8* 9.1 6.7  NEUTROABS 4.2  --   --   --   HGB 11.2* 12.1 12.0 11.0*  HCT 35.4* 38.3 37.6 35.1*  MCV 86.1 85.1 85.6 85.6  PLT 179 186 241 247   Cardiac Enzymes: Recent Labs  Lab 11/22/17 2334 11/23/17 0248 11/23/17 0948 11/23/17 1507  TROPONINI 0.11* 0.08* 0.09* 0.09*   BNP: BNP (last 3 results) Recent Labs    10/29/17 2050 11/01/17 1808 11/22/17 2334  BNP 3,633.0* 3,734.0* >4,500.0*    CBG: Recent Labs  Lab 11/27/17 2053 11/28/17 0733 11/28/17 1122 11/28/17 1712 11/28/17 2031  GLUCAP 111* 101* 99 108* 166*     Signed:  Vassie Loll MD.  [facility] 11/29/2017, 10:44 AM

## 2017-11-29 NOTE — Progress Notes (Addendum)
Patient Iv removed, tolerated well. Discharge instructions and Report given to Nurse at [facility]. Daughter Byrd Hesselbach, informed as well as POA Ron.

## 2017-11-29 NOTE — Progress Notes (Signed)
[name] [name]  MRN: [id number]  DOB/AGE: 05-10-1924 84 y.o.  Primary Care Physician:Shah, Beatrix Fetters, MD  Admit date: 11/22/2017  Chief Complaint:  Chief Complaint  Patient presents with  . Shortness of Breath    S-Pt presented on  11/22/2017 with  Chief Complaint  Patient presents with  . Shortness of Breath  .    Pt offers no new complaints.    Pt main concern was " Food does not tastes good here"      Meds . aspirin EC  81 mg Oral Daily  . heparin  5,000 Units Subcutaneous Q8H  . insulin aspart  0-5 Units Subcutaneous QHS  . insulin aspart  0-9 Units Subcutaneous TID WC  . metoprolol succinate  12.5 mg Oral Daily  . torsemide  20 mg Oral Daily      Physical Exam: Vital signs in last 24 hours: Temp:  [97.7 F (36.5 C)-98.4 F (36.9 C)] 98.3 F (36.8 C) (03/06 0604) Pulse Rate:  [60-67] 60 (03/06 0604) Resp:  [17-18] 18 (03/06 0604) BP: (115-122)/(68-71) 122/68 (03/06 0604) SpO2:  [93 %-99 %] 93 % (03/06 0604) Weight:  [98 lb 15.8 oz (44.9 kg)] 98 lb 15.8 oz (44.9 kg) (03/06 0604) Weight change: -12.2 oz (-0.8 kg) Last BM Date: 11/26/17  Intake/Output from previous day: 03/05 0701 - 03/06 0700 In: 240 [P.O.:240] Out: 400 [Urine:400] No intake/output data recorded.   Physical Exam: General- pt is awake,alert, oriented to time place and person Resp- No acute REsp distress, CTA B/L NO Rhonchi CVS- S1S2 regular in rate and rhythm GIT- BS+, soft, NT, ND EXT- NO LE Edema, Cyanosis   Lab Results: CBC Latest Ref Rng & Units 11/26/2017 11/25/2017 11/24/2017  WBC 4.0 - 10.5 K/uL 6.7 9.1 14.8(H)  Hemoglobin 12.0 - 15.0 g/dL 11.0(L) 12.0 12.1  Hematocrit 36.0 - 46.0 % 35.1(L) 37.6 38.3  Platelets 150 - 400 K/uL 247 241 186        BMET Recent Labs    11/28/17 0540 11/29/17 0548  NA 133* 135  K 4.3 3.8  CL 93* 94*  CO2 24 28  GLUCOSE 98 116*  BUN 78* 77*  CREATININE 3.16* 2.73*  CALCIUM 9.3 9.2    Creat trend 2019    2.0=> 2.3=> 2.8=> 3.1=> 2.7    1.69--1.96 2018   1.1--1.5 2017    1.46 2009     1.29     Lab Results  Component Value Date   CALCIUM 9.2 11/29/2017   CAION 1.22 02/08/2012   PHOS 3.9 11/29/2017               Impression: 1)Renal  AKI secondary to Post renal/Hypotension/ Cardiorenal                AKI on CKD               CKD stage 4.               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  Nephrolithiasis Hx Absent                 Autoimmune work up done                [employer] negative -1.21.19               24 hr urine showed proteinuria of only 47 milligrams                Spep negativ                AKI improving                        2)HTN  Medication- On Diuretics  3)Anemia HGb at goal (9--11)   4)CKD Mineral-Bone Disorder  Phosphorus at goal. Calcium is at goal. VItamin D deficincy present  5)CHF-admited with CHF Clinically better PMD following  6)Electrolytes  hyperkalemic now better  Hyponatremic   Now better   7)Acid base Co2 now  at goal      Plan:  Will continue current care      BHUTANI,MANPREET S 11/29/2017, 9:12 AM

## 2017-11-29 NOTE — Progress Notes (Signed)
Patient upset and confused stating that the nursing home was so posed to pick her up yesterday.  Explained to patient that she was going to the nursing home tomorrow.  Patient still upset and insisting that she needs to go to the nursing home.  Called patients daughter and let them talk.  Patients daughter was able to calm patient down.  Patient is currently resting in her room.  Will continue to monitor.

## 2017-11-29 NOTE — Clinical Social Work Placement (Signed)
   CLINICAL SOCIAL WORK PLACEMENT  NOTE  Date:  11/29/2017  Patient Details  Name: [name] [name] MRN: [id number] Date of Birth: December 11, 1923  Clinical Social Work is seeking post-discharge placement for this patient at the Skilled  Nursing Facility level of care (*CSW will initial, date and re-position this form in  chart as items are completed):  Yes   Patient/family provided with [facility] Clinical Social Work Department's list of facilities offering this level of care within the geographic area requested by the patient (or if unable, by the patient's family).  Yes   Patient/family informed of their freedom to choose among providers that offer the needed level of care, that participate in Medicare, Medicaid or managed care program needed by the patient, have an available bed and are willing to accept the patient.  Yes   Patient/family informed of [facility]'s ownership interest in [facility] and [facility], as well as of the fact that they are under no obligation to receive care at these facilities.  PASRR submitted to EDS on       PASRR number received on       Existing PASRR number confirmed on 11/28/17     FL2 transmitted to all facilities in geographic area requested by pt/family on 11/28/17     FL2 transmitted to all facilities within larger geographic area on       Patient informed that his/her managed care company has contracts with or will negotiate with certain facilities, including the following:        Yes   Patient/family informed of bed offers received.  Patient chooses bed at Other - please specify in the comment section below:([facility])     Physician recommends and patient chooses bed at      Patient to be transferred to Other - please specify in the comment section below:([facility]) on 11/29/17.  Patient to be transferred to facility by EMS     Patient family notified on 11/29/17 of transfer.  Name of family member notified:  [name]     PHYSICIAN       Additional Comment: Notified by Eunice Blase at [facility] that they have insurance auth and they are prepared to accept pt. Updated pt's RN. She will call report and arrange transport. Pushed DC clinical through the [employer]. POA and daughter updated this AM. There are no other LCSW needs for dc.   _______________________________________________ Elliot Gault, LCSW 11/29/2017, 12:46 PM

## 2017-12-23 ENCOUNTER — Emergency Department (HOSPITAL_COMMUNITY): Payer: Medicare Other

## 2017-12-23 ENCOUNTER — Inpatient Hospital Stay (HOSPITAL_COMMUNITY)
Admission: EM | Admit: 2017-12-23 | Discharge: 2018-01-24 | DRG: 291 | Disposition: E | Payer: Medicare Other | Attending: Internal Medicine | Admitting: Internal Medicine

## 2017-12-23 ENCOUNTER — Encounter (HOSPITAL_COMMUNITY): Payer: Self-pay | Admitting: *Deleted

## 2017-12-23 ENCOUNTER — Other Ambulatory Visit: Payer: Self-pay

## 2017-12-23 DIAGNOSIS — I447 Left bundle-branch block, unspecified: Secondary | ICD-10-CM | POA: Diagnosis present

## 2017-12-23 DIAGNOSIS — Z86711 Personal history of pulmonary embolism: Secondary | ICD-10-CM

## 2017-12-23 DIAGNOSIS — J181 Lobar pneumonia, unspecified organism: Secondary | ICD-10-CM | POA: Diagnosis present

## 2017-12-23 DIAGNOSIS — E876 Hypokalemia: Secondary | ICD-10-CM

## 2017-12-23 DIAGNOSIS — W010XXA Fall on same level from slipping, tripping and stumbling without subsequent striking against object, initial encounter: Secondary | ICD-10-CM | POA: Diagnosis present

## 2017-12-23 DIAGNOSIS — N3281 Overactive bladder: Secondary | ICD-10-CM | POA: Diagnosis present

## 2017-12-23 DIAGNOSIS — R627 Adult failure to thrive: Secondary | ICD-10-CM | POA: Diagnosis present

## 2017-12-23 DIAGNOSIS — Z823 Family history of stroke: Secondary | ICD-10-CM

## 2017-12-23 DIAGNOSIS — E875 Hyperkalemia: Secondary | ICD-10-CM | POA: Diagnosis not present

## 2017-12-23 DIAGNOSIS — S300XXA Contusion of lower back and pelvis, initial encounter: Secondary | ICD-10-CM

## 2017-12-23 DIAGNOSIS — E119 Type 2 diabetes mellitus without complications: Secondary | ICD-10-CM

## 2017-12-23 DIAGNOSIS — Z9842 Cataract extraction status, left eye: Secondary | ICD-10-CM

## 2017-12-23 DIAGNOSIS — I130 Hypertensive heart and chronic kidney disease with heart failure and stage 1 through stage 4 chronic kidney disease, or unspecified chronic kidney disease: Secondary | ICD-10-CM | POA: Diagnosis not present

## 2017-12-23 DIAGNOSIS — H9190 Unspecified hearing loss, unspecified ear: Secondary | ICD-10-CM | POA: Diagnosis present

## 2017-12-23 DIAGNOSIS — N184 Chronic kidney disease, stage 4 (severe): Secondary | ICD-10-CM

## 2017-12-23 DIAGNOSIS — J690 Pneumonitis due to inhalation of food and vomit: Secondary | ICD-10-CM

## 2017-12-23 DIAGNOSIS — E43 Unspecified severe protein-calorie malnutrition: Secondary | ICD-10-CM | POA: Diagnosis present

## 2017-12-23 DIAGNOSIS — I429 Cardiomyopathy, unspecified: Secondary | ICD-10-CM | POA: Diagnosis present

## 2017-12-23 DIAGNOSIS — Z9841 Cataract extraction status, right eye: Secondary | ICD-10-CM

## 2017-12-23 DIAGNOSIS — Y92129 Unspecified place in nursing home as the place of occurrence of the external cause: Secondary | ICD-10-CM

## 2017-12-23 DIAGNOSIS — E1122 Type 2 diabetes mellitus with diabetic chronic kidney disease: Secondary | ICD-10-CM | POA: Diagnosis present

## 2017-12-23 DIAGNOSIS — Z8673 Personal history of transient ischemic attack (TIA), and cerebral infarction without residual deficits: Secondary | ICD-10-CM

## 2017-12-23 DIAGNOSIS — J9601 Acute respiratory failure with hypoxia: Secondary | ICD-10-CM

## 2017-12-23 DIAGNOSIS — Z66 Do not resuscitate: Secondary | ICD-10-CM | POA: Diagnosis present

## 2017-12-23 DIAGNOSIS — I5043 Acute on chronic combined systolic (congestive) and diastolic (congestive) heart failure: Secondary | ICD-10-CM | POA: Diagnosis present

## 2017-12-23 DIAGNOSIS — I509 Heart failure, unspecified: Secondary | ICD-10-CM

## 2017-12-23 DIAGNOSIS — Z7982 Long term (current) use of aspirin: Secondary | ICD-10-CM

## 2017-12-23 DIAGNOSIS — Z79899 Other long term (current) drug therapy: Secondary | ICD-10-CM

## 2017-12-23 DIAGNOSIS — R296 Repeated falls: Secondary | ICD-10-CM | POA: Diagnosis present

## 2017-12-23 DIAGNOSIS — G9341 Metabolic encephalopathy: Secondary | ICD-10-CM

## 2017-12-23 DIAGNOSIS — I472 Ventricular tachycardia: Secondary | ICD-10-CM | POA: Diagnosis present

## 2017-12-23 DIAGNOSIS — M1990 Unspecified osteoarthritis, unspecified site: Secondary | ICD-10-CM | POA: Diagnosis present

## 2017-12-23 DIAGNOSIS — Z681 Body mass index (BMI) 19 or less, adult: Secondary | ICD-10-CM

## 2017-12-23 DIAGNOSIS — Z961 Presence of intraocular lens: Secondary | ICD-10-CM | POA: Diagnosis present

## 2017-12-23 DIAGNOSIS — I10 Essential (primary) hypertension: Secondary | ICD-10-CM | POA: Diagnosis present

## 2017-12-23 DIAGNOSIS — I2510 Atherosclerotic heart disease of native coronary artery without angina pectoris: Secondary | ICD-10-CM | POA: Diagnosis present

## 2017-12-23 DIAGNOSIS — K761 Chronic passive congestion of liver: Secondary | ICD-10-CM | POA: Diagnosis present

## 2017-12-23 DIAGNOSIS — Z515 Encounter for palliative care: Secondary | ICD-10-CM | POA: Diagnosis present

## 2017-12-23 DIAGNOSIS — W19XXXA Unspecified fall, initial encounter: Secondary | ICD-10-CM

## 2017-12-23 DIAGNOSIS — E785 Hyperlipidemia, unspecified: Secondary | ICD-10-CM | POA: Diagnosis present

## 2017-12-23 DIAGNOSIS — I248 Other forms of acute ischemic heart disease: Secondary | ICD-10-CM | POA: Diagnosis present

## 2017-12-23 DIAGNOSIS — I340 Nonrheumatic mitral (valve) insufficiency: Secondary | ICD-10-CM | POA: Diagnosis present

## 2017-12-23 NOTE — ED Triage Notes (Signed)
Pt is a resident of [facility] after being found in floor by nursing staff, pt had unwitnessed fall. Upon ems arrival pt pulse ox 84% on RA, pt does not wear oxygen, is currently being treated for URI with Augmentin. Pt able to answer questions upon arrival to er, pulse ox 95%on 3 liters of oxygen, pt states that she was trying to get her shoe and her foot slipped, c/o pain to "tailbone" area.

## 2017-12-23 NOTE — ED Notes (Signed)
Patient transported to X-ray 

## 2017-12-23 NOTE — ED Provider Notes (Signed)
[facility] EMERGENCY DEPARTMENT Provider Note   CSN: [id number] Arrival date & time: 15-Jan-2018  2220     History   Chief Complaint Chief Complaint  Patient presents with  . Fall    HPI [name] [name] is a 84 y.o. female.  HPI Patient presents to the emergency room for evaluation after an unwitnessed fall.  Patient is a resident of a nursing facility.  She was found on the floor this evening by nursing staff.  Patient states she slipped when she was trying to get back into bed.  Patient has been having some difficulty with recent coughing.  When EMS arrived they did note that her oxygen was in the mid 80s.  Patient is normally not on oxygen.  Patient states when she fell she landed on her tailbone.  She denies any other injuries.  No headache or loss of consciousness.  No vomiting or diarrhea. Past Medical History:  Diagnosis Date  . Arthritis   . Chronic combined systolic and diastolic CHF (congestive heart failure) (HCC)   . CKD (chronic kidney disease), stage IV (HCC)   . Coronary artery disease    Cath 2004--60% LAD--diffuse and calcified, 40% PDA  . Depression   . Hyperlipidemia   . Hypertension   . LBBB (left bundle branch block)   . Mitral regurgitation   . Nonischemic cardiomyopathy (HCC)    a. EF 30% 2004 b. 10/2006 EF=60-65% c. 06/2017: EF 15-20%, no regional WMA, Grade 1 DD, moderate MR  . Overactive bladder   . Pulmonary embolism (HCC)    status post  . Stroke (cerebrum) (HCC)   . SVT (supraventricular tachycardia) (HCC)    a. 32 beat run narrow complex rhythm in 06/2017.  . Type II diabetes mellitus [facility])     Patient Active Problem List   Diagnosis Date Noted  . Scabies   . SOB (shortness of breath)   . Physical deconditioning   . Goals of care, counseling/discussion   . Palliative care by specialist   . Encounter for hospice care discussion   . Protein-calorie malnutrition, severe 11/24/2017  . CKD (chronic kidney disease), stage III (HCC) 11/23/2017    . CHF (congestive heart failure) (HCC) 11/23/2017  . CHF exacerbation (HCC) 11/01/2017  . Acute on chronic combined systolic and diastolic CHF (congestive heart failure) (HCC) 10/15/2017  . Nonischemic cardiomyopathy (HCC) 10/15/2017  . Coronary artery disease 10/15/2017  . Chest pain 07/20/2017  . Type II diabetes mellitus (HCC)   . PE 10/01/2009  . Difficulty walking 10/01/2009  . Dysthymic disorder 07/06/2009  . UNSPECIFIED CHRONIC ISCHEMIC HEART DISEASE 07/06/2009  . Left bundle branch block (LBBB) 07/06/2009  . Coronary atherosclerosis 12/12/2007  . BACK PAIN, THORACIC REGION, CHRONIC 12/12/2007  . Hyperlipidemia 09/11/2007  . CATARACTS 09/11/2007  . Essential hypertension 09/11/2007  . OVERACTIVE BLADDER 09/11/2007  . Arthropathy 09/11/2007    Past Surgical History:  Procedure Laterality Date  . APPENDECTOMY  1959  . BACK SURGERY  1967; 1977  . CATARACT EXTRACTION W/ INTRAOCULAR LENS  IMPLANT, BILATERAL  2008   right eye  . CHOLECYSTECTOMY  1959  . TONSILLECTOMY       OB History    Gravida  2   Para  2   Term  2   Preterm      AB      Living        SAB      TAB      Ectopic  Multiple      Live Births               Home Medications    Prior to Admission medications   Medication Sig Start Date End Date Taking? Authorizing Provider  acetaminophen (TYLENOL) 325 MG tablet Take 2 tablets (650 mg total) by mouth every 6 (six) hours as needed for mild pain or headache (or Fever >/= 101). 11/29/17   Vassie Loll, MD  aspirin EC 81 MG EC tablet Take 1 tablet (81 mg total) by mouth daily. 10/19/17   Erick Blinks, MD  hydrocortisone (ANUSOL-HC) 2.5 % rectal cream Apply 1 application topically 2 (two) times daily as needed for hemorrhoids. 10/18/17   Erick Blinks, MD  hydrOXYzine (VISTARIL) 25 MG capsule Take 1 capsule (25 mg total) by mouth every 12 (twelve) hours as needed for itching. 1 at hs for itching. 11/29/17   Vassie Loll, MD   metoprolol succinate (TOPROL-XL) 25 MG 24 hr tablet Take 0.5 tablets (12.5 mg total) by mouth daily. 10/27/17   Strader, Lennart Pall, PA-C  torsemide (DEMADEX) 20 MG tablet Take 1 tablet (20 mg total) by mouth daily. 11/30/17   Vassie Loll, MD  traMADol-acetaminophen (ULTRACET) 37.5-325 MG tablet Take 1 tablet by mouth every 8 (eight) hours as needed for severe pain. 11/29/17   Vassie Loll, MD    Family History Family History  Problem Relation Age of Onset  . Stroke Mother        deceased    Social History Social History   Tobacco Use  . Smoking status: Never Smoker  . Smokeless tobacco: Never Used  Substance Use Topics  . Alcohol use: No  . Drug use: No     Allergies   Patient has no known allergies.   Review of Systems Review of Systems  All other systems reviewed and are negative.    Physical Exam Updated Vital Signs BP 101/66   Pulse 88   Temp (!) 97.3 F (36.3 C) (Oral)   Resp (!) 28   Wt 48.6 kg (107 lb 4 oz)   SpO2 91%   BMI 19.00 kg/m   Physical Exam  Constitutional:  Elderly frail  HENT:  Head: Normocephalic and atraumatic.  Right Ear: External ear normal.  Left Ear: External ear normal.  Eyes: Conjunctivae are normal. Right eye exhibits no discharge. Left eye exhibits no discharge. No scleral icterus.  Neck: Neck supple. No tracheal deviation present.  Cardiovascular: Normal rate, regular rhythm and intact distal pulses.  Pulmonary/Chest: Effort normal and breath sounds normal. No stridor. Tachypnea noted. No respiratory distress. She has no wheezes. She has no rales.  Abdominal: Soft. Bowel sounds are normal. She exhibits no distension. There is no tenderness. There is no rebound and no guarding.  Musculoskeletal: She exhibits no edema or tenderness.       Right shoulder: She exhibits no tenderness, no bony tenderness and no swelling.       Left shoulder: She exhibits no tenderness, no bony tenderness and no swelling.       Right wrist: She  exhibits no tenderness, no bony tenderness and no swelling.       Left wrist: She exhibits no tenderness, no bony tenderness and no swelling.       Right hip: She exhibits normal range of motion, no tenderness, no bony tenderness and no swelling.       Left hip: She exhibits normal range of motion, no tenderness and no bony tenderness.  Right ankle: She exhibits no swelling. No tenderness.       Left ankle: She exhibits no swelling. No tenderness.       Cervical back: She exhibits no tenderness, no bony tenderness and no swelling.       Thoracic back: She exhibits no tenderness, no bony tenderness and no swelling.       Lumbar back: She exhibits no tenderness, no bony tenderness and no swelling.  Neurological: She is alert. She has normal strength. No cranial nerve deficit (no facial droop, extraocular movements intact, no slurred speech) or sensory deficit. She exhibits normal muscle tone. She displays no seizure activity. Coordination normal.  Skin: Skin is warm and dry. No rash noted. She is not diaphoretic.  Psychiatric: She has a normal mood and affect.  Nursing note and vitals reviewed.    ED Treatments / Results  Labs (all labs ordered are listed, but only abnormal results are displayed) Labs Reviewed  CBC WITH DIFFERENTIAL/PLATELET  BASIC METABOLIC PANEL  TROPONIN I  BRAIN NATRIURETIC PEPTIDE  URINALYSIS, ROUTINE W REFLEX MICROSCOPIC  labs pending  EKG EKG Interpretation  Date/Time:  Saturday 12/29/2017 22:51:23 EDT Ventricular Rate:  83 PR Interval:    QRS Duration: 158 QT Interval:  453 QTC Calculation: 533 R Axis:   18 Text Interpretation:  Sinus rhythm Ventricular premature complex IVCD, consider atypical LBBB No significant change since last tracing Confirmed by Linwood Dibbles [phone]) on 01/12/2018 11:03:38 PM   Radiology pending Procedures Procedures (including critical care time)  Medications Ordered in ED Medications - No data to display   Initial  Impression / Assessment and Plan / ED Course  I have reviewed the triage vital signs and the nursing notes.  Pertinent labs & imaging results that were available during my care of the patient were reviewed by me and considered in my medical decision making (see chart for details).   Pt presented after a fall at the nursing home. Low o2 sat on arrival and pt is coughing.  Sx concerning for possible pna.  No signs of serious injury after the fall.  No hip ttp.  Mild ttp in the buttock and lower back.  Will check labs, and xrays.    Case turned over to Dr Judd Lien  Final Clinical Impressions(s) / ED Diagnoses  pending   Linwood Dibbles, MD Dec 25, 2017 2356

## 2017-12-24 DIAGNOSIS — J9601 Acute respiratory failure with hypoxia: Secondary | ICD-10-CM

## 2017-12-24 DIAGNOSIS — E876 Hypokalemia: Secondary | ICD-10-CM

## 2017-12-24 DIAGNOSIS — N184 Chronic kidney disease, stage 4 (severe): Secondary | ICD-10-CM

## 2017-12-24 DIAGNOSIS — W19XXXA Unspecified fall, initial encounter: Secondary | ICD-10-CM

## 2017-12-24 LAB — GLUCOSE, CAPILLARY
GLUCOSE-CAPILLARY: 179 mg/dL — AB (ref 65–99)
GLUCOSE-CAPILLARY: 82 mg/dL (ref 65–99)
Glucose-Capillary: 145 mg/dL — ABNORMAL HIGH (ref 65–99)
Glucose-Capillary: 203 mg/dL — ABNORMAL HIGH (ref 65–99)
Glucose-Capillary: 80 mg/dL (ref 65–99)
Glucose-Capillary: 89 mg/dL (ref 65–99)

## 2017-12-24 LAB — BASIC METABOLIC PANEL
ANION GAP: 17 — AB (ref 5–15)
ANION GAP: 17 — AB (ref 5–15)
BUN: 63 mg/dL — ABNORMAL HIGH (ref 6–20)
BUN: 67 mg/dL — ABNORMAL HIGH (ref 6–20)
CALCIUM: 9.5 mg/dL (ref 8.9–10.3)
CHLORIDE: 93 mmol/L — AB (ref 101–111)
CO2: 23 mmol/L (ref 22–32)
CO2: 27 mmol/L (ref 22–32)
Calcium: 9.6 mg/dL (ref 8.9–10.3)
Chloride: 93 mmol/L — ABNORMAL LOW (ref 101–111)
Creatinine, Ser: 1.88 mg/dL — ABNORMAL HIGH (ref 0.44–1.00)
Creatinine, Ser: 1.94 mg/dL — ABNORMAL HIGH (ref 0.44–1.00)
GFR calc Af Amer: 26 mL/min — ABNORMAL LOW (ref >60)
GFR calc non Af Amer: 21 mL/min — ABNORMAL LOW (ref >60)
GFR, EST AFRICAN AMERICAN: 25 mL/min — AB (ref >60)
GFR, EST NON AFRICAN AMERICAN: 22 mL/min — AB (ref >60)
GLUCOSE: 191 mg/dL — AB (ref 65–99)
Glucose, Bld: 192 mg/dL — ABNORMAL HIGH (ref 65–99)
POTASSIUM: 2.9 mmol/L — AB (ref 3.5–5.1)
Potassium: 4.4 mmol/L (ref 3.5–5.1)
Sodium: 133 mmol/L — ABNORMAL LOW (ref 135–145)
Sodium: 137 mmol/L (ref 135–145)

## 2017-12-24 LAB — CBC WITH DIFFERENTIAL/PLATELET
Basophils Absolute: 0 10*3/uL (ref 0.0–0.1)
Basophils Relative: 0 %
EOS PCT: 0 %
Eosinophils Absolute: 0 10*3/uL (ref 0.0–0.7)
HEMATOCRIT: 34.9 % — AB (ref 36.0–46.0)
Hemoglobin: 10.7 g/dL — ABNORMAL LOW (ref 12.0–15.0)
LYMPHS ABS: 0.5 10*3/uL — AB (ref 0.7–4.0)
LYMPHS PCT: 3 %
MCH: 25.6 pg — AB (ref 26.0–34.0)
MCHC: 30.7 g/dL (ref 30.0–36.0)
MCV: 83.5 fL (ref 78.0–100.0)
MONO ABS: 0.9 10*3/uL (ref 0.1–1.0)
Monocytes Relative: 6 %
Neutro Abs: 14.2 10*3/uL — ABNORMAL HIGH (ref 1.7–7.7)
Neutrophils Relative %: 91 %
PLATELETS: 414 10*3/uL — AB (ref 150–400)
RBC: 4.18 MIL/uL (ref 3.87–5.11)
RDW: 16.1 % — ABNORMAL HIGH (ref 11.5–15.5)
WBC: 15.7 10*3/uL — AB (ref 4.0–10.5)

## 2017-12-24 LAB — MAGNESIUM: MAGNESIUM: 1.7 mg/dL (ref 1.7–2.4)

## 2017-12-24 LAB — BRAIN NATRIURETIC PEPTIDE: B Natriuretic Peptide: >4500 pg/mL — ABNORMAL HIGH (ref 0.0–100.0)

## 2017-12-24 LAB — TROPONIN I: Troponin I: 0.08 ng/mL (ref <0.03)

## 2017-12-24 LAB — MRSA PCR SCREENING: MRSA by PCR: NEGATIVE

## 2017-12-24 MED ORDER — HYDROCORTISONE 2.5 % RE CREA
1.0000 "application " | TOPICAL_CREAM | Freq: Two times a day (BID) | RECTAL | Status: DC | PRN
  Filled 2017-12-24: qty 28.35

## 2017-12-24 MED ORDER — ENSURE ENLIVE PO LIQD
237.0000 mL | Freq: Two times a day (BID) | ORAL | Status: DC
  Administered 2017-12-24 – 2017-12-25 (×3): 237 mL via ORAL

## 2017-12-24 MED ORDER — HYDROXYZINE HCL 25 MG PO TABS
25.0000 mg | ORAL_TABLET | Freq: Two times a day (BID) | ORAL | Status: DC | PRN
  Administered 2017-12-24: 25 mg via ORAL
  Filled 2017-12-24: qty 1

## 2017-12-24 MED ORDER — ONDANSETRON HCL 4 MG PO TABS: 4.0000 mg | ORAL_TABLET | Freq: Four times a day (QID) | ORAL | Status: DC | PRN

## 2017-12-24 MED ORDER — HEPARIN SODIUM (PORCINE) 5000 UNIT/ML IJ SOLN
5000.0000 [IU] | Freq: Three times a day (TID) | INTRAMUSCULAR | Status: DC
  Administered 2017-12-24 – 2017-12-25 (×3): 5000 [IU] via SUBCUTANEOUS
  Filled 2017-12-24 (×3): qty 1

## 2017-12-24 MED ORDER — POTASSIUM CHLORIDE 10 MEQ/100ML IV SOLN
INTRAVENOUS | Status: AC
  Filled 2017-12-24: qty 100

## 2017-12-24 MED ORDER — INSULIN ASPART 100 UNIT/ML ~~LOC~~ SOLN
0.0000 [IU] | SUBCUTANEOUS | Status: DC
  Administered 2017-12-24: 1 [IU] via SUBCUTANEOUS
  Administered 2017-12-24: 3 [IU] via SUBCUTANEOUS

## 2017-12-24 MED ORDER — ORAL CARE MOUTH RINSE
15.0000 mL | Freq: Two times a day (BID) | OROMUCOSAL | Status: DC
  Administered 2017-12-24 – 2017-12-25 (×3): 15 mL via OROMUCOSAL

## 2017-12-24 MED ORDER — POTASSIUM CHLORIDE 10 MEQ/100ML IV SOLN
10.0000 meq | INTRAVENOUS | Status: AC
  Administered 2017-12-24 (×6): 10 meq via INTRAVENOUS
  Filled 2017-12-24 (×2): qty 100

## 2017-12-24 MED ORDER — METOPROLOL SUCCINATE ER 25 MG PO TB24
12.5000 mg | ORAL_TABLET | Freq: Every day | ORAL | Status: DC
  Administered 2017-12-24 – 2017-12-25 (×2): 12.5 mg via ORAL
  Filled 2017-12-24 (×4): qty 1

## 2017-12-24 MED ORDER — FUROSEMIDE 10 MG/ML IJ SOLN
40.0000 mg | Freq: Two times a day (BID) | INTRAMUSCULAR | Status: DC
  Administered 2017-12-24 (×2): 40 mg via INTRAVENOUS
  Filled 2017-12-24 (×2): qty 4

## 2017-12-24 MED ORDER — TRAMADOL-ACETAMINOPHEN 37.5-325 MG PO TABS: 1.0000 | ORAL_TABLET | Freq: Three times a day (TID) | ORAL | Status: DC | PRN

## 2017-12-24 MED ORDER — HYDROXYZINE PAMOATE 25 MG PO CAPS
25.0000 mg | ORAL_CAPSULE | Freq: Two times a day (BID) | ORAL | Status: DC | PRN
  Filled 2017-12-24: qty 1

## 2017-12-24 MED ORDER — ONDANSETRON HCL 4 MG/2ML IJ SOLN: 4.0000 mg | Freq: Four times a day (QID) | INTRAMUSCULAR | Status: DC | PRN

## 2017-12-24 MED ORDER — FUROSEMIDE 10 MG/ML IJ SOLN
40.0000 mg | Freq: Once | INTRAMUSCULAR | Status: AC
  Administered 2017-12-24: 40 mg via INTRAVENOUS
  Filled 2017-12-24: qty 4

## 2017-12-24 MED ORDER — SODIUM CHLORIDE 0.9% FLUSH
3.0000 mL | Freq: Two times a day (BID) | INTRAVENOUS | Status: DC
  Administered 2017-12-24 – 2017-12-25 (×3): 3 mL via INTRAVENOUS

## 2017-12-24 MED ORDER — SODIUM CHLORIDE 0.9% FLUSH: 3.0000 mL | INTRAVENOUS | Status: DC | PRN

## 2017-12-24 MED ORDER — ASPIRIN EC 81 MG PO TBEC
81.0000 mg | DELAYED_RELEASE_TABLET | Freq: Every day | ORAL | Status: DC
  Administered 2017-12-24 – 2017-12-25 (×2): 81 mg via ORAL
  Filled 2017-12-24 (×2): qty 1

## 2017-12-24 MED ORDER — SODIUM CHLORIDE 0.9 % IV SOLN: 250.0000 mL | INTRAVENOUS | Status: DC | PRN

## 2017-12-24 MED ORDER — ACETAMINOPHEN 325 MG PO TABS: 650.0000 mg | ORAL_TABLET | Freq: Four times a day (QID) | ORAL | Status: DC | PRN

## 2017-12-24 NOTE — ED Provider Notes (Signed)
Care assumed from Dr. Lynelle Doctor at shift change.  Patient brought here after an unwitnessed fall at her extended care facility.  She was found there to be hypoxic which appears to be related to an exacerbation of CHF.  Chest x-ray shows increased vascular congestion and BNP is greater than 4500.  Whether this was the cause of her fall or not, I am uncertain.  Her x-rays show no evidence of traumatic injury.  I have ordered IV Lasix and the patient will be admitted to the hospitalist service under the care of Dr. Sherryll Burger.   Geoffery Lyons, MD 12/24/17 [phone]

## 2017-12-24 NOTE — H&P (Signed)
History and Physical    ROSIO [name] XLK:[id number] DOB: [birth year] DOA: 12/15/2017  PCP: Kirstie Peri, MD   Patient coming from: SNF  Chief Complaint: Mechanical fall with hypoxemia  HPI: [name] [name] is a 84 y.o. female with medical history significant forCAD, chronic combined systolic and diastolic CHF with EF 15-20%, stage IV CKD, known left bundle branch block, hypertension, dyslipidemia, DM II, severe PCM, and prior CVA who had an unwitnessed fall at her skilled nursing facility this evening.  Apparently, the patient slipped when she was trying to get back in bed.  The patient apparently has been coughing more recently and upon EMS arrival, she was found to have oxygen saturations in the 80th percentile.  She normally is not on oxygen.  She apparently fell on her tailbone and did not hit her head and no significant injuries were reported.  There is no loss of consciousness noted. The patient cannot give any history due to her condition and there is nobody else at the bedside to assist in history taking.  ED Course: Vital signs are currently noted to be stable, but there are episodes of V. tach noted on telemetry.  She is currently on 2 L nasal cannula with oxygen saturation in the high 90th percentile.  Laboratory data with leukocytosis of 15,700, potassium 2.9, BUN 67 and creatinine 1.88 which is at her usual baseline.  Glucose 191, and troponin 0.08.  Two-view chest x-ray with mild CHF and left-sided pleural effusion and pelvic and lumbar films with no acute findings.  Her BNP was also noted to be greater than 4500.  EKG with sinus rhythm at 83 bpm.  She was given 1 dose of IV Lasix 40 mg with no significant diuresis noted as of yet.  She appears to be in no acute respiratory distress.  Review of Systems: Cannot be obtained due to patient condition.  Past Medical History:  Diagnosis Date  . Arthritis   . Chronic combined systolic and diastolic CHF (congestive heart failure) (HCC)     . CKD (chronic kidney disease), stage IV (HCC)   . Coronary artery disease    Cath 2004--60% LAD--diffuse and calcified, 40% PDA  . Depression   . Hyperlipidemia   . Hypertension   . LBBB (left bundle branch block)   . Mitral regurgitation   . Nonischemic cardiomyopathy (HCC)    a. EF 30% 2004 b. 10/2006 EF=60-65% c. 06/2017: EF 15-20%, no regional WMA, Grade 1 DD, moderate MR  . Overactive bladder   . Pulmonary embolism (HCC)    status post  . Stroke (cerebrum) (HCC)   . SVT (supraventricular tachycardia) (HCC)    a. 32 beat run narrow complex rhythm in 06/2017.  . Type II diabetes mellitus (HCC)     Past Surgical History:  Procedure Laterality Date  . APPENDECTOMY  1959  . BACK SURGERY  1967; 1977  . CATARACT EXTRACTION W/ INTRAOCULAR LENS  IMPLANT, BILATERAL  2008   right eye  . CHOLECYSTECTOMY  1959  . TONSILLECTOMY       reports that she has never smoked. She has never used smokeless tobacco. She reports that she does not drink alcohol or use drugs.  No Known Allergies  Family History  Problem Relation Age of Onset  . Stroke Mother        deceased    Prior to Admission medications   Medication Sig Start Date End Date Taking? Authorizing Provider  acetaminophen (TYLENOL) 325 MG tablet Take 2 tablets (  650 mg total) by mouth every 6 (six) hours as needed for mild pain or headache (or Fever >/= 101). 11/29/17   Vassie Loll, MD  aspirin EC 81 MG EC tablet Take 1 tablet (81 mg total) by mouth daily. 10/19/17   Erick Blinks, MD  hydrocortisone (ANUSOL-HC) 2.5 % rectal cream Apply 1 application topically 2 (two) times daily as needed for hemorrhoids. 10/18/17   Erick Blinks, MD  hydrOXYzine (VISTARIL) 25 MG capsule Take 1 capsule (25 mg total) by mouth every 12 (twelve) hours as needed for itching. 1 at hs for itching. 11/29/17   Vassie Loll, MD  metoprolol succinate (TOPROL-XL) 25 MG 24 hr tablet Take 0.5 tablets (12.5 mg total) by mouth daily. 10/27/17   Strader,  Lennart Pall, PA-C  torsemide (DEMADEX) 20 MG tablet Take 1 tablet (20 mg total) by mouth daily. 11/30/17   Vassie Loll, MD  traMADol-acetaminophen (ULTRACET) 37.5-325 MG tablet Take 1 tablet by mouth every 8 (eight) hours as needed for severe pain. 11/29/17   Vassie Loll, MD    Physical Exam: Vitals:   01-22-2018 2226 January 22, 2018 2229 12/24/17 0000 12/24/17 0130  BP:  101/66 110/79 (!) 103/56  Pulse:  88 (!) 43 77  Resp:  (!) 28 18 20   Temp:  (!) 97.3 F (36.3 C)    TempSrc:  Oral    SpO2:  91% 99% 96%  Weight: 48.6 kg (107 lb 4 oz)       Constitutional: NAD, poorly responsive and appears somnolent laying on her side Vitals:   January 22, 2018 2226 Jan 22, 2018 2229 12/24/17 0000 12/24/17 0130  BP:  101/66 110/79 (!) 103/56  Pulse:  88 (!) 43 77  Resp:  (!) 28 18 20   Temp:  (!) 97.3 F (36.3 C)    TempSrc:  Oral    SpO2:  91% 99% 96%  Weight: 48.6 kg (107 lb 4 oz)      ENMT: Mucous membranes are moist.  Edentulous. Neck: normal, supple Respiratory: clear to auscultation bilaterally. Normal respiratory effort. No accessory muscle use.  Currently on 2 L nasal cannula. Cardiovascular: Regular rate and rhythm, no murmurs. No extremity edema. Abdomen: no tenderness, no distention. Bowel sounds positive.  Musculoskeletal:  No joint deformity upper and lower extremities.   Skin: no rashes, lesions, ulcers.   Labs on Admission: I have personally reviewed following labs and imaging studies  CBC: Recent Labs  Lab January 22, 2018 2332  WBC 15.7*  NEUTROABS 14.2*  HGB 10.7*  HCT 34.9*  MCV 83.5  PLT 414*   Basic Metabolic Panel: Recent Labs  Lab 01/22/2018 2332  NA 137  K 2.9*  CL 93*  CO2 27  GLUCOSE 191*  BUN 67*  CREATININE 1.88*  CALCIUM 9.6   GFR: Estimated Creatinine Clearance: 15 mL/min (A) (by C-G formula based on SCr of 1.88 mg/dL (H)). Liver Function Tests: No results for input(s): AST, ALT, ALKPHOS, BILITOT, PROT, ALBUMIN in the last 168 hours. No results for input(s):  LIPASE, AMYLASE in the last 168 hours. No results for input(s): AMMONIA in the last 168 hours. Coagulation Profile: No results for input(s): INR, PROTIME in the last 168 hours. Cardiac Enzymes: Recent Labs  Lab 01-22-2018 2332  TROPONINI 0.08*   BNP (last 3 results) No results for input(s): PROBNP in the last 8760 hours. HbA1C: No results for input(s): HGBA1C in the last 72 hours. CBG: No results for input(s): GLUCAP in the last 168 hours. Lipid Profile: No results for input(s): CHOL, HDL, LDLCALC, TRIG,  CHOLHDL, LDLDIRECT in the last 72 hours. Thyroid Function Tests: No results for input(s): TSH, T4TOTAL, FREET4, T3FREE, THYROIDAB in the last 72 hours. Anemia Panel: No results for input(s): VITAMINB12, FOLATE, FERRITIN, TIBC, IRON, RETICCTPCT in the last 72 hours. Urine analysis:    Component Value Date/Time   COLORURINE YELLOW 11/22/2017 2328   APPEARANCEUR CLEAR 11/22/2017 2328   LABSPEC 1.009 11/22/2017 2328   PHURINE 5.0 11/22/2017 2328   GLUCOSEU NEGATIVE 11/22/2017 2328   HGBUR SMALL (A) 11/22/2017 2328   BILIRUBINUR NEGATIVE 11/22/2017 2328   KETONESUR NEGATIVE 11/22/2017 2328   PROTEINUR NEGATIVE 11/22/2017 2328   UROBILINOGEN 0.2 12/30/2007 1850   NITRITE NEGATIVE 11/22/2017 2328   LEUKOCYTESUR SMALL (A) 11/22/2017 2328    Radiological Exams on Admission: Dg Chest 2 View  Result Date: December 31, 2017 CLINICAL DATA:  Dyspnea EXAM: CHEST - 2 VIEW COMPARISON:  11/24/2017 chest radiograph. FINDINGS: Stable cardiomediastinal silhouette with mild cardiomegaly. No pneumothorax. Small left pleural effusion. No right pleural effusion. Mild pulmonary edema. Mild bibasilar scarring versus atelectasis. IMPRESSION: 1. Mild congestive heart failure with small left pleural effusion. 2. Mild bibasilar scarring versus atelectasis. Electronically Signed   By: Delbert Phenix M.D.   On: 2017-12-31 23:56   Dg Lumbar Spine Complete  Result Date: 12/24/2017 CLINICAL DATA:  Found down at  nursing facility. EXAM: LUMBAR SPINE - COMPLETE 4+ VIEW COMPARISON:  CT abdomen and pelvis May 09, 2017 FINDINGS: No definite lumbar spine fracture deformity though, limited assessment of L5-S1 due to cross-table lateral radiographs and osteopenia. Demonstrated lumbar vertebral bodies intact. No malalignment. Broad dextroscoliosis. No destructive bony lesions. Aortoiliac calcifications. LEFT pleural effusion. IMPRESSION: Limited assessment L5-S1 due to cross-table lateral radiographs and, osteopenia. No demonstrated fracture deformity or malalignment. Aortic Atherosclerosis (ICD10-I70.0). Electronically Signed   By: Awilda Metro M.D.   On: 12/24/2017 00:18   Dg Pelvis 1-2 Views  Result Date: 12/24/2017 CLINICAL DATA:  84 year old female with fall EXAM: PELVIS - 1-2 VIEW COMPARISON:  CT of the abdomen pelvis dated 05/09/2017 FINDINGS: No definite acute fracture or dislocation. Foreshortened appearance of the femoral necks, likely positional on this single provided view. Degenerative changes of the lumbar spine. Atherosclerotic calcification of the aorta. The soft tissues are grossly unremarkable. IMPRESSION: No definite acute fracture or dislocation. Electronically Signed   By: Elgie Collard M.D.   On: 12/24/2017 00:16    EKG: Independently reviewed. SR at 83bpm with LBBB. Episodes of Vtach on tele.  Assessment/Plan Principal Problem:   Acute hypoxemic respiratory failure (HCC) Active Problems:   Essential hypertension   Left bundle branch block (LBBB)   Type II diabetes mellitus (HCC)   CHF exacerbation (HCC)   Protein-calorie malnutrition, severe   Hypokalemia   Fall   CKD (chronic kidney disease), stage IV (HCC)    1. Acute hypoxemic respiratory failure secondary to combined CHF decompensation.  Hold home torsemide and place on Lasix IV twice daily with 1 dose already given in ED.  Monitor daily weights and strict I's and O's.  She was recently discharged on 11/29/2017 for the same  issue and it was decided at that time that patient may require hospice care with further readmissions.  She should have palliative care consultation once again when services available with likely placement to hospice. 2. Mechanical fall.  This is due to progressive deconditioning in the face of severe protein calorie malnourishment.  Fall precautions.  She already has SNF placement with PT services.  No further need for assessment at this time as  she is a hospice candidate with prior prognosis of less than 6 months. 3. Hypokalemia.  Replete with IV potassium and recheck labs at noon with magnesium levels. 4. Elevated troponin secondary to above.  We will not continue to follow at this time as there is no chest pain and heroic measures are not desired. 5. Type 2 diabetes.  Sliding scale insulin coverage. 6. Protein calorie malnutrition.  Reconsult nutrition for supplementation. 7. CKD stage IV.  Stable continue to monitor I's and O's with repeat labs especially with diuresis.   DVT prophylaxis: Heparin Code Status: DNR Family Communication: None at bedside Disposition Plan:Diuresis to wean off O2; likely need for hospice on DC Consults called:None Admission status: Observation, tele   Darroll Bredeson Hoover Brunette DO [facility] Pager [phone]  If 7PM-7AM, please contact night-coverage [email or web address] Password [email or web address]  12/24/2017, 1:55 AM

## 2017-12-24 NOTE — Progress Notes (Signed)
Patient is admitted early this am, she is alert, NAD, only oriented to self, very hard of hearing, lung exam + bibasilar crackles, lower extremity no edema, bp stable, not in respiratory distress,  continue iv lasix Overall poor prognosis, has recurrent hospitalizations for CHF exacerbation, recent ef 15-20%. palliative care consulted for goals of care.  May need to start hospice service at discharge.

## 2017-12-24 NOTE — ED Notes (Signed)
Date and time results received: 12/24/17 12:53 AM    Test: troponin Critical Value: 0.08  Name of Provider Notified: Dr Judd Lien made aware  Orders Received? Or Actions Taken?: Dr Judd Lien made aware

## 2017-12-24 NOTE — ED Notes (Signed)
Pt had run of V-tach, pt has pulse and is asymptomatic. Dr Judd Lien aware. No new orders received.

## 2017-12-25 DIAGNOSIS — I509 Heart failure, unspecified: Secondary | ICD-10-CM | POA: Diagnosis not present

## 2017-12-25 DIAGNOSIS — J181 Lobar pneumonia, unspecified organism: Secondary | ICD-10-CM | POA: Diagnosis present

## 2017-12-25 DIAGNOSIS — J690 Pneumonitis due to inhalation of food and vomit: Secondary | ICD-10-CM

## 2017-12-25 DIAGNOSIS — R296 Repeated falls: Secondary | ICD-10-CM | POA: Diagnosis present

## 2017-12-25 DIAGNOSIS — S300XXA Contusion of lower back and pelvis, initial encounter: Secondary | ICD-10-CM | POA: Diagnosis present

## 2017-12-25 DIAGNOSIS — J9601 Acute respiratory failure with hypoxia: Secondary | ICD-10-CM | POA: Diagnosis not present

## 2017-12-25 DIAGNOSIS — E875 Hyperkalemia: Secondary | ICD-10-CM | POA: Diagnosis not present

## 2017-12-25 DIAGNOSIS — E876 Hypokalemia: Secondary | ICD-10-CM | POA: Diagnosis present

## 2017-12-25 DIAGNOSIS — R627 Adult failure to thrive: Secondary | ICD-10-CM | POA: Diagnosis present

## 2017-12-25 DIAGNOSIS — I429 Cardiomyopathy, unspecified: Secondary | ICD-10-CM | POA: Diagnosis present

## 2017-12-25 DIAGNOSIS — K761 Chronic passive congestion of liver: Secondary | ICD-10-CM | POA: Diagnosis present

## 2017-12-25 DIAGNOSIS — I447 Left bundle-branch block, unspecified: Secondary | ICD-10-CM | POA: Diagnosis present

## 2017-12-25 DIAGNOSIS — N184 Chronic kidney disease, stage 4 (severe): Secondary | ICD-10-CM

## 2017-12-25 DIAGNOSIS — H9190 Unspecified hearing loss, unspecified ear: Secondary | ICD-10-CM | POA: Diagnosis present

## 2017-12-25 DIAGNOSIS — W19XXXA Unspecified fall, initial encounter: Secondary | ICD-10-CM

## 2017-12-25 DIAGNOSIS — Z681 Body mass index (BMI) 19 or less, adult: Secondary | ICD-10-CM | POA: Diagnosis not present

## 2017-12-25 DIAGNOSIS — I10 Essential (primary) hypertension: Secondary | ICD-10-CM

## 2017-12-25 DIAGNOSIS — E1122 Type 2 diabetes mellitus with diabetic chronic kidney disease: Secondary | ICD-10-CM | POA: Diagnosis present

## 2017-12-25 DIAGNOSIS — I5043 Acute on chronic combined systolic (congestive) and diastolic (congestive) heart failure: Secondary | ICD-10-CM | POA: Diagnosis present

## 2017-12-25 DIAGNOSIS — I472 Ventricular tachycardia: Secondary | ICD-10-CM | POA: Diagnosis present

## 2017-12-25 DIAGNOSIS — Z515 Encounter for palliative care: Secondary | ICD-10-CM

## 2017-12-25 DIAGNOSIS — G9341 Metabolic encephalopathy: Secondary | ICD-10-CM | POA: Diagnosis present

## 2017-12-25 DIAGNOSIS — E785 Hyperlipidemia, unspecified: Secondary | ICD-10-CM | POA: Diagnosis present

## 2017-12-25 DIAGNOSIS — W010XXA Fall on same level from slipping, tripping and stumbling without subsequent striking against object, initial encounter: Secondary | ICD-10-CM | POA: Diagnosis present

## 2017-12-25 DIAGNOSIS — I248 Other forms of acute ischemic heart disease: Secondary | ICD-10-CM | POA: Diagnosis present

## 2017-12-25 DIAGNOSIS — I2510 Atherosclerotic heart disease of native coronary artery without angina pectoris: Secondary | ICD-10-CM | POA: Diagnosis present

## 2017-12-25 DIAGNOSIS — I130 Hypertensive heart and chronic kidney disease with heart failure and stage 1 through stage 4 chronic kidney disease, or unspecified chronic kidney disease: Secondary | ICD-10-CM | POA: Diagnosis present

## 2017-12-25 DIAGNOSIS — Y92129 Unspecified place in nursing home as the place of occurrence of the external cause: Secondary | ICD-10-CM | POA: Diagnosis not present

## 2017-12-25 DIAGNOSIS — E43 Unspecified severe protein-calorie malnutrition: Secondary | ICD-10-CM

## 2017-12-25 DIAGNOSIS — I340 Nonrheumatic mitral (valve) insufficiency: Secondary | ICD-10-CM | POA: Diagnosis present

## 2017-12-25 DIAGNOSIS — Z66 Do not resuscitate: Secondary | ICD-10-CM | POA: Diagnosis present

## 2017-12-25 DIAGNOSIS — Z7189 Other specified counseling: Secondary | ICD-10-CM | POA: Diagnosis not present

## 2017-12-25 LAB — GLUCOSE, CAPILLARY
GLUCOSE-CAPILLARY: 84 mg/dL (ref 65–99)
Glucose-Capillary: 83 mg/dL (ref 65–99)
Glucose-Capillary: 92 mg/dL (ref 65–99)

## 2017-12-25 LAB — COMPREHENSIVE METABOLIC PANEL
ALBUMIN: 2.9 g/dL — AB (ref 3.5–5.0)
ALT: 344 U/L — ABNORMAL HIGH (ref 14–54)
AST: 1560 U/L — ABNORMAL HIGH (ref 15–41)
Alkaline Phosphatase: 286 U/L — ABNORMAL HIGH (ref 38–126)
BUN: 83 mg/dL — AB (ref 6–20)
CHLORIDE: 92 mmol/L — AB (ref 101–111)
CO2: 19 mmol/L — AB (ref 22–32)
Calcium: 9.6 mg/dL (ref 8.9–10.3)
Creatinine, Ser: 2.41 mg/dL — ABNORMAL HIGH (ref 0.44–1.00)
GFR calc Af Amer: 19 mL/min — ABNORMAL LOW (ref >60)
GFR calc non Af Amer: 16 mL/min — ABNORMAL LOW (ref >60)
GLUCOSE: 84 mg/dL (ref 65–99)
POTASSIUM: 5.7 mmol/L — AB (ref 3.5–5.1)
SODIUM: 132 mmol/L — AB (ref 135–145)
Total Bilirubin: 2.1 mg/dL — ABNORMAL HIGH (ref 0.3–1.2)
Total Protein: 6.6 g/dL (ref 6.5–8.1)

## 2017-12-25 LAB — CBC
HCT: 39.2 % (ref 36.0–46.0)
HEMOGLOBIN: 12.4 g/dL (ref 12.0–15.0)
MCH: 26.5 pg (ref 26.0–34.0)
MCHC: 31.6 g/dL (ref 30.0–36.0)
MCV: 83.8 fL (ref 78.0–100.0)
PLATELETS: ADEQUATE 10*3/uL (ref 150–400)
RBC: 4.68 MIL/uL (ref 3.87–5.11)
RDW: 16.5 % — ABNORMAL HIGH (ref 11.5–15.5)
WBC: 19.6 10*3/uL — ABNORMAL HIGH (ref 4.0–10.5)

## 2017-12-25 MED ORDER — MORPHINE SULFATE (CONCENTRATE) 10 MG/0.5ML PO SOLN
2.5000 mg | ORAL | Status: DC | PRN
  Administered 2017-12-25: 2.6 mg via SUBLINGUAL
  Filled 2017-12-25: qty 0.5

## 2017-12-25 MED ORDER — PIPERACILLIN-TAZOBACTAM 3.375 G IVPB: 3.3750 g | Freq: Two times a day (BID) | INTRAVENOUS | Status: DC

## 2017-12-25 DEATH — deceased

## 2018-01-24 NOTE — Progress Notes (Signed)
Daily Progress Note   Patient Name: [name] [name]       Date: 01-18-2018 DOB: 07/05/1924  Age: 84 y.o. MRN#: [id number] Attending Physician: [name] Hartshorn, MD Primary Care Physician: [name] Peri, MD Admit Date: 12/14/2017  Reason for Consultation/Follow-up: Establishing goals of care, Inpatient hospice referral and Psychosocial/spiritual support  Subjective: [name] [name] is resting quietly in bed.  She will make an briefly keep eye contact.  She is alert, but oriented to self only at this time.  She appears extremely frail, acutely/chronically ill.  There is no family at bedside at this time.  Call to healthcare power of attorney, [name] [name].  We talked about [name] [name]'s recent decline, her frail state.  We talked about [name] [name]'s 5 hospitalizations and 3 ED visits in the last 6 months.  We talked about her continued decline.  [name] [name] states that when he saw her on Friday evening she was able to eat and drink, but after she fell she is "not the same".  We talked about the benefits of residential hospice.  We talked in detail about what is and is not provided at residential hospice including IV fluids.  [name] [name] is agreeable to unburden [name] [name] from IV fluids and medications that are not changing what is happening.  Conference with hospitalist, [name] [name] related to plan of care and disposition.  Conference with social worker related to plan of care and residential hospice referral.  Length of Stay: 0  Current Medications: Scheduled Meds:  . feeding supplement (ENSURE ENLIVE)  237 mL Oral BID BM  . mouth rinse  15 mL Mouth Rinse BID  . metoprolol succinate  12.5 mg Oral Daily  . sodium chloride flush  3 mL Intravenous Q12H    Continuous Infusions: . sodium chloride    .  piperacillin-tazobactam (ZOSYN)  IV      PRN Meds: sodium chloride, acetaminophen, hydrocortisone, hydrOXYzine, morphine, ondansetron **OR** ondansetron (ZOFRAN) IV, sodium chloride flush  Physical Exam  Constitutional: No distress.  Appears frail, acutely/chronically ill  HENT:  Severe temporal wasting, periorbital wasting  Cardiovascular: Normal rate.  Pulmonary/Chest: Effort normal. No respiratory distress.  Abdominal: Soft. She exhibits no distension.  Musculoskeletal: She exhibits no edema.  Neurological: She is alert.  Oriented to self only at this time  Skin: Skin is warm and dry.  Psychiatric:  Calm and cooperative, not fearful  Nursing note and vitals reviewed.           Vital Signs: BP 117/69 (BP Location: Right Arm) Comment (BP Location): RN rechecked   Pulse 91   Temp (!) 97.3 F (36.3 C) (Oral)   Resp (!) 22   Ht 5\' 1"  (1.549 m)   Wt 44.3 kg (97 lb 10.6 oz)   SpO2 97%   BMI 18.45 kg/m  SpO2: SpO2: 97 % O2 Device: O2 Device: Nasal Cannula O2 Flow Rate: O2 Flow Rate (L/min): 2 L/min  Intake/output summary:   Intake/Output Summary (Last 24 hours) at 01-08-18 1153 Last data filed at 08-Jan-2018 0900 Gross per 24 hour  Intake 240 ml  Output 4 ml  Net 236 ml   LBM: Last BM Date: 12/24/17 Baseline Weight: Weight: 48.6 kg (107 lb 4 oz) Most recent weight: Weight: 44.3 kg (97 lb 10.6 oz)       Palliative Assessment/Data:      Patient Active Problem List   Diagnosis Date Noted  . Heart failure (HCC) January 08, 2018  . Hypokalemia 12/24/2017  . Fall 12/24/2017  . CKD (chronic kidney disease), stage IV (HCC) 12/24/2017  . Acute hypoxemic respiratory failure (HCC) 12/24/2017  . Scabies   . SOB (shortness of breath)   . Physical deconditioning   . Goals of care, counseling/discussion   . Palliative care by specialist   . Encounter for hospice care discussion   . Protein-calorie malnutrition, severe 11/24/2017  . CKD (chronic kidney disease), stage III  (HCC) 11/23/2017  . CHF (congestive heart failure) (HCC) 11/23/2017  . CHF exacerbation (HCC) 11/01/2017  . Acute on chronic combined systolic and diastolic CHF (congestive heart failure) (HCC) 10/15/2017  . Nonischemic cardiomyopathy (HCC) 10/15/2017  . Coronary artery disease 10/15/2017  . Chest pain 07/20/2017  . Type II diabetes mellitus (HCC)   . PE 10/01/2009  . Difficulty walking 10/01/2009  . Dysthymic disorder 07/06/2009  . UNSPECIFIED CHRONIC ISCHEMIC HEART DISEASE 07/06/2009  . Left bundle branch block (LBBB) 07/06/2009  . Coronary atherosclerosis 12/12/2007  . BACK PAIN, THORACIC REGION, CHRONIC 12/12/2007  . Hyperlipidemia 09/11/2007  . CATARACTS 09/11/2007  . Essential hypertension 09/11/2007  . OVERACTIVE BLADDER 09/11/2007  . Arthropathy 09/11/2007    Palliative Care Assessment & Plan   Patient Profile:  [name] [name] is a 84 year old female with a medical history of coronary artery disease, chronic combined heart failure with EF of 15-20%, chronic kidney disease stage IV, left Brandel branch block, high blood pressure and cholesterol, diabetes, prior CVA, who was admitted 3/34 mechanical fall with hypoxemia.  Assessment: Mechanical fall with hypoxemia; questioned at first CHF, no fluid overload, likely pneumonia.  [name] [name] is clearly at end of life, frail and debilitated.  Healthcare power of attorney is requesting comfort and dignity at end of life, residential hospice.  Recommendations/Plan:  Medical team is recommending comfort and dignity at end of life, residential hospice.  Healthcare power of attorney [name] [name] is agreeable to comfort and dignity at end of life, residential hospice with hosp[facility] of [facility].  Goals of Care and Additional Recommendations:  Limitations on Scope of Treatment: Full Comfort Care  Code Status:    Code Status Orders  (From admission, onward)        Start     Ordered   12/24/17 0217  Do not attempt  resuscitation (DNR)  Continuous    Question Answer Comment  In the event of cardiac or respiratory  ARREST Do not call a "code blue"   In the event of cardiac or respiratory ARREST Do not perform Intubation, CPR, defibrillation or ACLS   In the event of cardiac or respiratory ARREST Use medication by any route, position, wound care, and other measures to relive pain and suffering. May use oxygen, suction and manual treatment of airway obstruction as needed for comfort.      12/24/17 0219    Code Status History    Date Active Date Inactive Code Status Order ID Comments User Context   11/23/2017 1700 11/29/2017 1636 DNR [id number]  Eddie North, MD Inpatient   11/23/2017 0221 11/23/2017 1700 Full Code [id number]  Maurilio Lovely D, DO ED   11/01/2017 2235 11/02/2017 1739 Full Code [id number]  Meredeth Ide, MD Inpatient   07/20/2017 1707 07/22/2017 1602 Full Code [id number]  Jonah Blue, MD Inpatient   06/15/2016 1409 06/16/2016 1751 Full Code [id number]  Raliegh Ip, DO Inpatient    Advance Directive Documentation     Most Recent Value  Type of Advance Directive  Healthcare Power of Attorney  Pre-existing out of facility DNR order (yellow form or pink MOST form)  Yellow form placed in chart (order not valid for inpatient use)  "MOST" Form in Place?  -       Prognosis:   < 4 weeks, 2 weeks or less would not be surprising based on frailty, functional status, poor p.o. intake, likely pneumonia.  Discharge Planning:  Healthcare power of attorney is requesting comfort and dignity at end of life, residential hospice with [facility].  Care plan was discussed with nursing staff, social worker, [name] [name].   Thank you for allowing the Palliative Medicine Team to assist in the care of this patient.   Time In: 1010 Time Out: 1100 Total Time 50 minutes Prolonged Time Billed  yes       Greater than 50%  of this time was spent counseling and coordinating care related to the above assessment  and plan.  Katheran Awe, NP  Please contact Palliative Medicine Team phone at [phone] for questions and concerns.

## 2018-01-24 NOTE — Death Summary Note (Signed)
DEATH SUMMARY   Patient Details  Name: [name] [name] MRN: [id number] DOB: 09-06-1924  Admission/Discharge Information   Admit Date:  January 20, 2018  Date of Death: Date of Death: 01/22/18  Time of Death: Time of Death: 1400  Length of Stay: 1  Referring Physician: Kirstie Peri, MD   Reason(s) for Hospitalization  Mechanical fall and hypoxia  Diagnoses  Preliminary cause of death: acute on chronic systolic and diastolic chf  Secondary Diagnoses (including complications and co-morbidities):  Acute respiratory failure with hypoxia -Secondary to CHF and pneumonia -Presently stable on 2 L -Wean oxygen for saturation greater 92% -Personally reviewed chest x-ray--RLL opacity, increase interstitial markings  Lobar pneumonia -concerned about a component of aspiration -start zosyn -blood cultures x 2 -check procalcitonin  Acute metabolic encephalopathy -Secondary to CHF and pneumonia -Patient remains somnolent  Acute on chronic systolic and diastolic CHF -10/17/2017 echo EF 15-20%, grade 2 DD, moderate TR/MR -Appears more clinically euvolemic -Holding IV furosemide due to elevation in serum creatinine -Start p.o. Lasix  Elevated troponin -No chest pain presently -Secondary to demand ischemia -Personally reviewed EKG--sinus rhythm, unchanged left bundle branch block  Transaminasemia -likely due to hepatic congestion/infection -RUQ ultrasound  CKD stage IV -Baseline creatinine 2.3-2.7  Diabetes mellitus type 2 -CBGs well-controlled -Discontinue NovoLog sliding scale and CBG checks  Failure to thrive/frequent falls -Palliative medicine consult for goals of care discussion -Patient's overall prognosis is poor  Severe protein calorie malnutrition -Continue supplements  Hyperkalemia -kayexalate x 1  Goals of care -Palliative medicine was consulted -Given the patient's comorbidities, advanced age, and recurrent hospitalizations, the patient's overall  prognosis was poor.  Palliative medicine discussed with the patient's POA.  It was determined to be in the patient's best interest to transition her focus of care to focus on full comfort  Brief Hospital Course (including significant findings, care, treatment, and services provided and events leading to death)  [name] [name] is a 84 year old female with history of coronary disease, systolic and diastolic CHF with EF 15-20%, CKD stage IV, left bundle branch block, hypertension, hyperlipidemia, diabetes mellitus type 2, stroke presented after an unwitnessed fall at her skilled nursing facility on the evening of 01/20/2018.  Apparently, the patient slipped when she was trying to get back in bed. The patient apparently has been coughing more recently and upon EMS arrival, she was found to have oxygen saturations in the 80th percentile. She normally is not on oxygen. She apparently fell on her tailbone and did not hit her head and no significant injuries were reported. There is no loss of consciousness noted.  At the time of admission, the patient was felt to be fluid overloaded.  The patient was started on intravenous furosemide.  Unfortunately, the patient's clinical condition has not improved.  She remains somnolent, but awakens able to answer simple questions.  The patient's respiratory status did not improve.  At the time of my evaluation, the patient was found to have a component of aspiration pneumonia.  Blood cultures were ordered and the patient was started on Zosyn.  Palliative medicine was consulted to assist with management.  Given the patient's comorbidities, advanced age, and recurrent hospitalizations, the patient's overall prognosis was poor.  Palliative medicine discussed with the patient's POA.  It was determined to be in the patient's best interest to transition her focus of care to focus on full comfort.  Medications with curative intent were discontinued to focus on the patient's  comfort.       Pertinent Labs and  Studies  Significant Diagnostic Studies Dg Chest 2 View  Result Date: 12/02/2017 CLINICAL DATA:  Dyspnea EXAM: CHEST - 2 VIEW COMPARISON:  11/24/2017 chest radiograph. FINDINGS: Stable cardiomediastinal silhouette with mild cardiomegaly. No pneumothorax. Small left pleural effusion. No right pleural effusion. Mild pulmonary edema. Mild bibasilar scarring versus atelectasis. IMPRESSION: 1. Mild congestive heart failure with small left pleural effusion. 2. Mild bibasilar scarring versus atelectasis. Electronically Signed   By: Delbert Phenix M.D.   On: 12/12/2017 23:56   Dg Lumbar Spine Complete  Result Date: 12/24/2017 CLINICAL DATA:  Found down at nursing facility. EXAM: LUMBAR SPINE - COMPLETE 4+ VIEW COMPARISON:  CT abdomen and pelvis May 09, 2017 FINDINGS: No definite lumbar spine fracture deformity though, limited assessment of L5-S1 due to cross-table lateral radiographs and osteopenia. Demonstrated lumbar vertebral bodies intact. No malalignment. Broad dextroscoliosis. No destructive bony lesions. Aortoiliac calcifications. LEFT pleural effusion. IMPRESSION: Limited assessment L5-S1 due to cross-table lateral radiographs and, osteopenia. No demonstrated fracture deformity or malalignment. Aortic Atherosclerosis (ICD10-I70.0). Electronically Signed   By: Awilda Metro M.D.   On: 12/24/2017 00:18   Dg Pelvis 1-2 Views  Result Date: 12/24/2017 CLINICAL DATA:  84 year old female with fall EXAM: PELVIS - 1-2 VIEW COMPARISON:  CT of the abdomen pelvis dated 05/09/2017 FINDINGS: No definite acute fracture or dislocation. Foreshortened appearance of the femoral necks, likely positional on this single provided view. Degenerative changes of the lumbar spine. Atherosclerotic calcification of the aorta. The soft tissues are grossly unremarkable. IMPRESSION: No definite acute fracture or dislocation. Electronically Signed   By: Elgie Collard M.D.   On:  12/24/2017 00:16    Microbiology Recent Results (from the past 240 hour(s))  MRSA PCR Screening     Status: None   Collection Time: 12/24/17  3:37 AM  Result Value Ref Range Status   MRSA by PCR NEGATIVE NEGATIVE Final    Comment:        The GeneXpert MRSA Assay (FDA approved for NASAL specimens only), is one component of a comprehensive MRSA colonization surveillance program. It is not intended to diagnose MRSA infection nor to guide or monitor treatment for MRSA infections. Performed at [facility], [address] [place], [place] [address]     Lab Basic Metabolic Panel: Recent Labs  Lab 12/20/2017 2332 12/24/17 0927 01-07-2018 0635  NA 137 133* 132*  K 2.9* 4.4 5.7*  CL 93* 93* 92*  CO2 27 23 19*  GLUCOSE 191* 192* 84  BUN 67* 63* 83*  CREATININE 1.88* 1.94* 2.41*  CALCIUM 9.6 9.5 9.6  MG  --  1.7  --    Liver Function Tests: Recent Labs  Lab 01-07-18 0635  AST 1,560*  ALT 344*  ALKPHOS 286*  BILITOT 2.1*  PROT 6.6  ALBUMIN 2.9*   No results for input(s): LIPASE, AMYLASE in the last 168 hours. No results for input(s): AMMONIA in the last 168 hours. CBC: Recent Labs  Lab 12/13/2017 2332 Jan 07, 2018 0635  WBC 15.7* 19.6*  NEUTROABS 14.2*  --   HGB 10.7* 12.4  HCT 34.9* 39.2  MCV 83.5 83.8  PLT 414* PLATELET CLUMPS NOTED ON SMEAR, COUNT APPEARS ADEQUATE   Cardiac Enzymes: Recent Labs  Lab 12/13/2017 2332  TROPONINI 0.08*   Sepsis Labs: Recent Labs  Lab 12/01/2017 2332 01-07-2018 0635  WBC 15.7* 19.6*    Procedures/Operations  none   Cassady Turano 12/26/2017, 6:19 PM

## 2018-01-24 NOTE — Progress Notes (Signed)
No apical pulse at 1400.  Verified by Edwinna Areola.  Daughjter at bedside.  Contacted Dr. Arbutus Leas and Rodney Booze. POA  arrived shortly, Whitney Muse

## 2018-01-24 NOTE — Clinical Social Work Note (Signed)
Per Palliative Care [facility], a request for [facility].      Dezhane Staten, Juleen China, LCSW

## 2018-01-24 NOTE — Progress Notes (Addendum)
PROGRESS NOTE  [name] [name] SNK:[id number] DOB: 05-05-1924 DOA: 12/13/2017 PCP: Kirstie Peri, MD  Brief History:  84 year old female with history of coronary disease, systolic and diastolic CHF with EF 15-20%, CKD stage IV, left bundle branch block, hypertension, hyperlipidemia, diabetes mellitus type 2, stroke presented after an unwitnessed fall at her skilled nursing facility on the evening of 12/09/2017.  Apparently, the patient slipped when she was trying to get back in bed.  The patient apparently has been coughing more recently and upon EMS arrival, she was found to have oxygen saturations in the 80th percentile.  She normally is not on oxygen.  She apparently fell on her tailbone and did not hit her head and no significant injuries were reported.  There is no loss of consciousness noted.  At the time of admission, the patient was felt to be fluid overloaded.  The patient was started on intravenous furosemide.  Unfortunately, the patient's clinical condition has not improved.  She remains somnolent, but awakens able to answer simple questions.   Assessment/Plan: Acute respiratory failure with hypoxia -Secondary to CHF and pneumonia -Presently stable on 2 L -Wean oxygen for saturation greater 92% -Personally reviewed chest x-ray--RLL opacity, increase interstitial markings  Lobar pneumonia -concerned about a component of aspiration -start zosyn -blood cultures x 2 -check procalcitonin  Acute metabolic encephalopathy -Secondary to CHF and pneumonia -Patient remains somnolent  Acute on chronic systolic and diastolic CHF -10/17/2017 echo EF 15-20%, grade 2 DD, moderate TR/MR -Appears more clinically euvolemic -Holding IV furosemide due to elevation in serum creatinine -Start p.o. Lasix  Elevated troponin -No chest pain presently -Secondary to demand ischemia -Personally reviewed EKG--sinus rhythm, unchanged left bundle branch block  Transaminasemia -likely due to  hepatic congestion/infection -RUQ ultrasound  CKD stage IV -Baseline creatinine 2.3-2.7  Diabetes mellitus type 2 -CBGs well-controlled -Discontinue NovoLog sliding scale and CBG checks  Failure to thrive/frequent falls -Palliative medicine consult for goals of care discussion -Patient's overall prognosis is poor  Severe protein calorie malnutrition -Continue supplements  Hyperkalemia -kayexalate x 1  Disposition Plan:  SNF with hospice or residential hospice Family Communication:   No Family at bedside  Consultants:  Palliative medicine  Code Status:  DNR  DVT Prophylaxis:  [place] Heparin   Procedures: As Listed in Progress Note Above  Antibiotics: Zosyn 4/1>>>    Subjective: Patient is somnolent but awakens to voice.  There is no respiratory distress, vomiting, diarrhea, uncontrolled pain.  She denies any headache, chest pain, abdominal pain.  Remainder review of systems unobtainable secondary to patient's mental status.  Objective: Vitals:   12/24/17 2152 01/23/2018 0607 01-23-2018 0647 01-23-18 0648  BP: 113/67 (!) 57/47 121/89 117/69  Pulse: 88 91    Resp: (!) 22 (!) 22    Temp: 98.3 F (36.8 C) (!) 97.3 F (36.3 C)    TempSrc: Oral Oral    SpO2: 100% 97%    Weight:  44.3 kg (97 lb 10.6 oz)    Height:        Intake/Output Summary (Last 24 hours) at 2018-01-23 1137 Last data filed at 01/23/2018 0900 Gross per 24 hour  Intake 240 ml  Output 4 ml  Net 236 ml   Weight change: -4.348 kg (-9 lb 9.4 oz) Exam:   General:  Pt is alert, follows commands appropriately, not in acute distress  HEENT: No icterus, No thrush, No neck mass, [place]/AT  Cardiovascular: RRR, S1/S2, no rubs, no gallops  Respiratory: Bibasilar crackles, right greater than left.  No wheezing.  Abdomen: Soft/+BS, non tender, non distended, no guarding  Extremities: No edema, No lymphangitis, No petechiae, No rashes, no synovitis   Data Reviewed: I have personally reviewed following labs  and imaging studies Basic Metabolic Panel: Recent Labs  Lab 12/02/2017 2332 12/24/17 0927 2017-12-30 0635  NA 137 133* 132*  K 2.9* 4.4 5.7*  CL 93* 93* 92*  CO2 27 23 19*  GLUCOSE 191* 192* 84  BUN 67* 63* 83*  CREATININE 1.88* 1.94* 2.41*  CALCIUM 9.6 9.5 9.6  MG  --  1.7  --    Liver Function Tests: Recent Labs  Lab 12-30-17 0635  AST 1,560*  ALT 344*  ALKPHOS 286*  BILITOT 2.1*  PROT 6.6  ALBUMIN 2.9*   No results for input(s): LIPASE, AMYLASE in the last 168 hours. No results for input(s): AMMONIA in the last 168 hours. Coagulation Profile: No results for input(s): INR, PROTIME in the last 168 hours. CBC: Recent Labs  Lab 12/13/2017 2332 Dec 30, 2017 0635  WBC 15.7* 19.6*  NEUTROABS 14.2*  --   HGB 10.7* 12.4  HCT 34.9* 39.2  MCV 83.5 83.8  PLT 414* PLATELET CLUMPS NOTED ON SMEAR, COUNT APPEARS ADEQUATE   Cardiac Enzymes: Recent Labs  Lab 12/09/2017 2332  TROPONINI 0.08*   BNP: Invalid input(s): POCBNP CBG: Recent Labs  Lab 12/24/17 1613 12/24/17 2052 12/24/17 2357 Dec 30, 2017 0655 December 30, 2017 0829  GLUCAP 89 82 80 83 92   HbA1C: No results for input(s): HGBA1C in the last 72 hours. Urine analysis:    Component Value Date/Time   COLORURINE YELLOW 11/22/2017 2328   APPEARANCEUR CLEAR 11/22/2017 2328   LABSPEC 1.009 11/22/2017 2328   PHURINE 5.0 11/22/2017 2328   GLUCOSEU NEGATIVE 11/22/2017 2328   HGBUR SMALL (A) 11/22/2017 2328   BILIRUBINUR NEGATIVE 11/22/2017 2328   KETONESUR NEGATIVE 11/22/2017 2328   PROTEINUR NEGATIVE 11/22/2017 2328   UROBILINOGEN 0.2 12/30/2007 1850   NITRITE NEGATIVE 11/22/2017 2328   LEUKOCYTESUR SMALL (A) 11/22/2017 2328   Sepsis Labs: @LABRCNTIP (procalcitonin:4,lacticidven:4) ) Recent Results (from the past 240 hour(s))  MRSA PCR Screening     Status: None   Collection Time: 12/24/17  3:37 AM  Result Value Ref Range Status   MRSA by PCR NEGATIVE NEGATIVE Final    Comment:        The GeneXpert MRSA Assay  (FDA approved for NASAL specimens only), is one component of a comprehensive MRSA colonization surveillance program. It is not intended to diagnose MRSA infection nor to guide or monitor treatment for MRSA infections. Performed at [facility], [address] [place], [place] [address]      Scheduled Meds: . aspirin EC  81 mg Oral Daily  . feeding supplement (ENSURE ENLIVE)  237 mL Oral BID BM  . heparin  5,000 Units Subcutaneous Q8H  . insulin aspart  0-9 Units Subcutaneous Q4H  . mouth rinse  15 mL Mouth Rinse BID  . metoprolol succinate  12.5 mg Oral Daily  . sodium chloride flush  3 mL Intravenous Q12H   Continuous Infusions: . sodium chloride      Procedures/Studies: Dg Chest 2 View  Result Date: 12/06/2017 CLINICAL DATA:  Dyspnea EXAM: CHEST - 2 VIEW COMPARISON:  11/24/2017 chest radiograph. FINDINGS: Stable cardiomediastinal silhouette with mild cardiomegaly. No pneumothorax. Small left pleural effusion. No right pleural effusion. Mild pulmonary edema. Mild bibasilar scarring versus atelectasis. IMPRESSION: 1. Mild congestive heart failure with small left pleural effusion. 2.  Mild bibasilar scarring versus atelectasis. Electronically Signed   By: Delbert Phenix M.D.   On: 12/06/2017 23:56   Dg Lumbar Spine Complete  Result Date: 12/24/2017 CLINICAL DATA:  Found down at nursing facility. EXAM: LUMBAR SPINE - COMPLETE 4+ VIEW COMPARISON:  CT abdomen and pelvis May 09, 2017 FINDINGS: No definite lumbar spine fracture deformity though, limited assessment of L5-S1 due to cross-table lateral radiographs and osteopenia. Demonstrated lumbar vertebral bodies intact. No malalignment. Broad dextroscoliosis. No destructive bony lesions. Aortoiliac calcifications. LEFT pleural effusion. IMPRESSION: Limited assessment L5-S1 due to cross-table lateral radiographs and, osteopenia. No demonstrated fracture deformity or malalignment. Aortic Atherosclerosis (ICD10-I70.0). Electronically Signed    By: Awilda Metro M.D.   On: 12/24/2017 00:18   Dg Pelvis 1-2 Views  Result Date: 12/24/2017 CLINICAL DATA:  84 year old female with fall EXAM: PELVIS - 1-2 VIEW COMPARISON:  CT of the abdomen pelvis dated 05/09/2017 FINDINGS: No definite acute fracture or dislocation. Foreshortened appearance of the femoral necks, likely positional on this single provided view. Degenerative changes of the lumbar spine. Atherosclerotic calcification of the aorta. The soft tissues are grossly unremarkable. IMPRESSION: No definite acute fracture or dislocation. Electronically Signed   By: Elgie Collard M.D.   On: 12/24/2017 00:16    Catarina Hartshorn, DO  [facility] Pager [phone]  If 7PM-7AM, please contact night-coverage [email or web address] Password [email or web address] December 29, 2017, 11:37 AM   LOS: 0 days

## 2018-01-24 NOTE — Progress Notes (Signed)
Initial Nutrition Assessment  DOCUMENTATION CODES:  Severe malnutrition (chronic)   Underweight  INTERVENTION:  Recommend liberalize diet prescription- to allow pt to eat as desired  Continue to offer oral supplements if she desires    NUTRITION DIAGNOSIS:  Severe malnutrition; chronic    GOAL: Meet needs as able given health care decisions focusing on less aggressive approach    MONITOR:  Po intake, labs and wt trends - changes in status    REASON FOR ASSESSMENT:   Consult Assessment of nutrition requirement/status, Poor PO  ASSESSMENT: Patient is a 84 yo female with DM-2, CKD-4, HTN, CHF and CAD. Multiple hospitalizations including diagnosis of severe malnutrition and reported persisting poor po intake (> 1 month). She presented on Saturday with c/o mechanical fall and hypoxemia.  Palliative medicine has assessed pt and discussed goal with decision maker for pt. Hospice referral is pending. Comfort is focus of care provision per nursing staff. Agreed from a nutrition standpoint.  PO's since admission have been 0-50%. Her NT today says pt refused every item on tray that was offered and in her room there are 2 opened Ensure Enlive bottles that appear full. The patient is unable to respond in a meaningful way to history related questions.  Her weight has dropped another 4 lb (4%) the past month.   Labs reviewed: BMP - sodium 132, Potassium 5.7, BUN-83, Cr 2.41, Alb. 2.9. AST- 1560, ALT 344.   NUTRITION - FOCUSED PHYSICAL EXAM: Severe muscle and fat depletions; ongoing.   Diet Order:  Diet heart healthy/carb modified Room service appropriate? Yes; Fluid consistency: Thin; Fluid restriction: 1500 mL Fluid  EDUCATION NEEDS:     Skin:  Skin Assessment: Reviewed RN Assessment  Last BM:  3/31 ssoft stool  Height:   Ht Readings from Last 1 Encounters:  12/24/17 5\' 1"  (1.549 m)    Weight:   Wt Readings from Last 1 Encounters:  2018-01-21 97 lb 10.6 oz (44.3 kg)     Ideal Body Weight:  48 kg  BMI:  Body mass index is 18.45 kg/m.  Estimated Nutritional Needs:   Kcal:  1500-1600 (if pt were aiming to meet est needs)  Protein:  40-45 gr (0.7-0.8 gr/kg)  Fluid:  1500 ml fluid restriction per MD   Royann Shivers MS,RD,CSG,LDN Office: [phone] Pager: [phone]

## 2018-01-24 NOTE — Progress Notes (Signed)
[facility] staff here now for body.  Contacted supervisor and bed control

## 2018-01-24 NOTE — Progress Notes (Signed)
Nurse Tech Dodi called to report patient's blood pressure is 57/47, taken at 0607.  Nurse, Dia Crawford, RN was not notified until [phone].    Repeat blood pressure at 0647 on the left arm showed patient's blood pressure to be 121/89.  Repeat blood pressure on the right arm at 0648 showed blood pressure to be 117/69.

## 2018-01-24 NOTE — Progress Notes (Signed)
Blood sugar taken by Nurse Dia Crawford, RN at [phone] was found to be 83.  Blood sugar scheduled at 0000 and 0400, was not taken by Nurse Tech Dodi.

## 2018-01-24 NOTE — Clinical Social Work Note (Signed)
Patient Information   Patient Name [name] [name], [name] [name] ([id number]) Sex Female DOB 19-Mar-1924 SSN [id number]  Room Bed  A315 A315-01  Patient Demographics   Address [address] [place] [address] Phone [phone] [facility]) [phone] (Mobile)  Patient Ethnicity & Race   Ethnic Group Patient Race  Not Hispanic or Latino White or Caucasian  Emergency Contact(s)   Name Relation Home Work Mobile  N[place]e Friend [phone]    Gordan Payment Niece [phone]    Documents on File    Status Date Received Description  Documents for the Patient  EMR Medication Summary  09/19/10   EMR Immunization Summary  09/02/10   EMR Problem Summary  09/02/10   EMR Patient Summary  09/09/10   [place] HIPAA NOTICE OF PRIVACY - Scanned Not Received    [facility] E-Signature HIPAA Notice of Privacy Received 02/08/12   Driver's License Not Received    Insurance Card Not Received    Advance Directives/Living Will/HCPOA/POA Not Received    Insurance Card  03/20/12   Other Photo ID Not Received    [place] E-Signature HIPAA Notice of Privacy Signed 04/26/17   Insurance Card Received 10/27/17 [place] Medicaid 2019  Release of Information Received 10/31/17 [facility] system wide DPR 2.1.19  HIM ROI Authorization  11/30/17 [facility] [place]  Advance Directives/Living Will/HCPOA/POA Received 11/30/17   Patient Photo   Photo of Patient  HIM Release of Information Output  12/01/17 Medication Administration Record  Documents for the Encounter  ED Patient Billing Extract   ED PB Billing Extract  Cardiac Monitoring Strip Shift Summary Received 12/24/17   Cardiac Monitoring Strip Received 12/24/17   EMS Run Sheet Received 01/15/2018   AOB (Assignment of Insurance Benefits) Not Received    E-signature AOB     MEDICARE RIGHTS Not Received    E-signature Medicare Rights     Admission Information   Attending Provider Admitting Provider Admission Type Admission Date/Time  Catarina Hartshorn, MD  Erick Blinks, DO Emergency January 19, 2018 2220  Discharge Date Hospital Service Auth/Cert Status Service Area   Internal Medicine Incomplete [facility]  Unit Room/Bed Admission Status   AP-DEPT 300 A315/A315-01 Admission (Confirmed)   Admission   Complaint  fall  Hospital Account   Name Acct ID Class Status Primary Coverage  Ineisha, Moffa [id number] Inpatient Open UNITED [employer] MEDICARE - [facility] MEDICARE      Guarantor Account (for Hospital Account [id number])   Name Relation to Pt Service Area Active? Acct Type  Kenna, Daine [place]vel Self [facility] Yes Personal/Family  Address Phone    [address] [place], [place] [address] [phone](H)        Coverage Information (for Hospital Account [id number])   1. [facility] MEDICARE/UHC MEDICARE   F/O Payor/Plan Precert #  [facility] MEDICARE/UHC MEDICARE   Subscriber Subscriber #  Alejandria, Plager [id number]  Address Phone  [address] [place], [place] [address] [phone]  2. MEDICAID [place]/MEDICAID OF [place]   F/O Payor/Plan Precert #  MEDICAID [place]/MEDICAID OF [place]   Subscriber Subscriber #  Gao, Blauer [id number]  Address Phone  [address] [address] [place], [place] [address] [phone]

## 2018-01-24 DEATH — deceased

## 2018-01-26 ENCOUNTER — Ambulatory Visit: Payer: Medicare Other | Admitting: Cardiology

## 2018-06-22 IMAGING — US US RENAL
1 series · 14 of 25 positions shown · non-contrast
Comparison: 10/16/2017

CLINICAL DATA: ATN

EXAM:
RENAL / URINARY TRACT ULTRASOUND COMPLETE

[Series 1: us renal · 0.20mm/px · 14 of 49 slices shown]
[im 1/49]
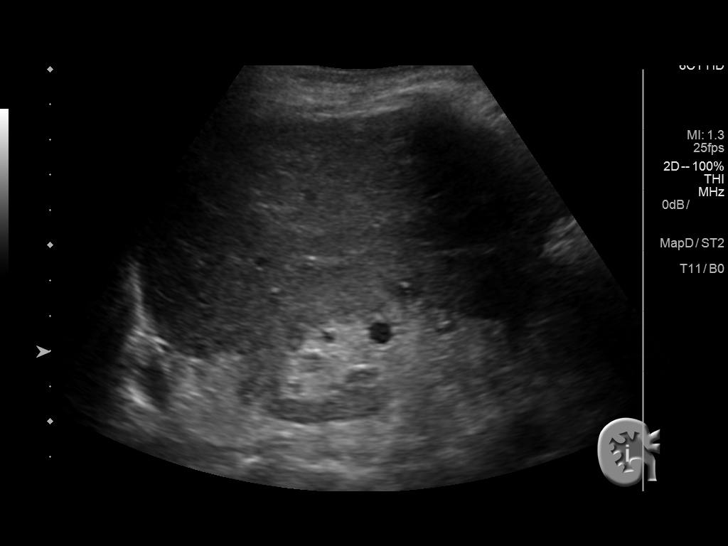
[im 5/49]
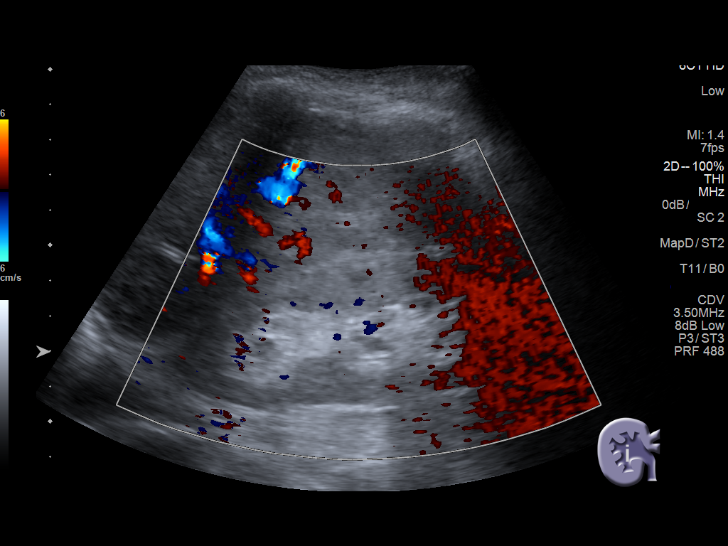
[im 9/49]
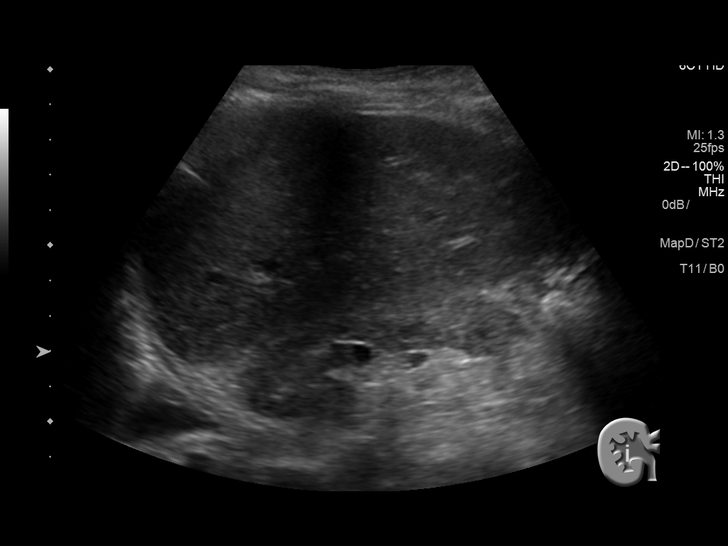
[im 13/49]
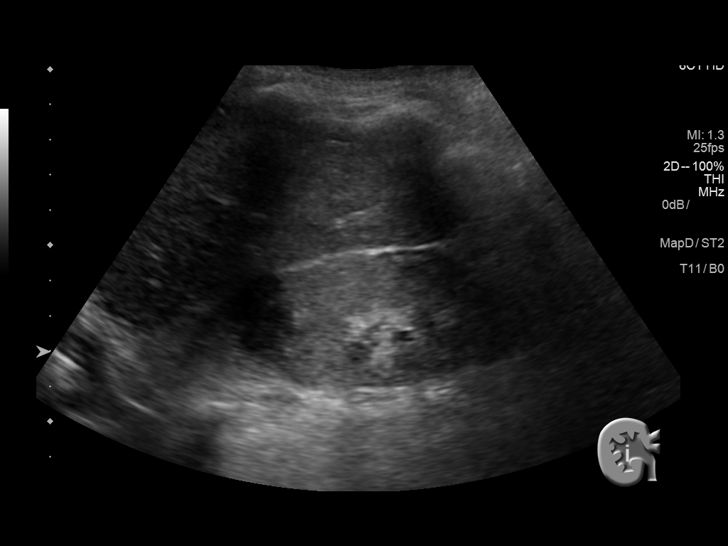
[im 17/49]
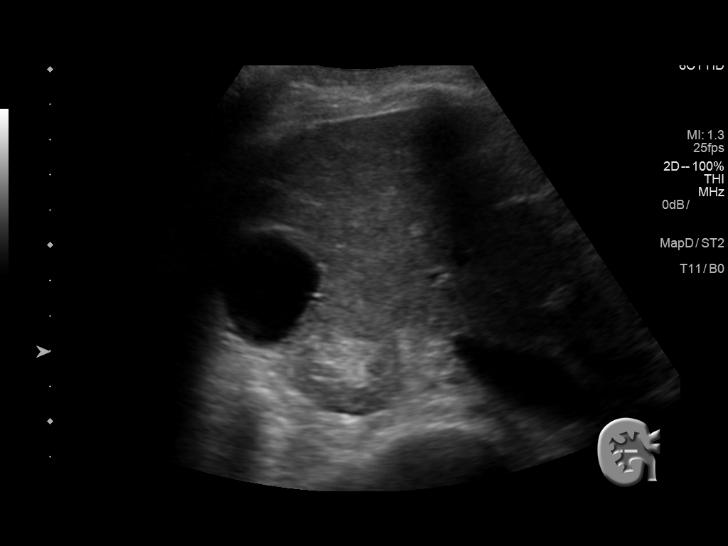
[im 19/49]
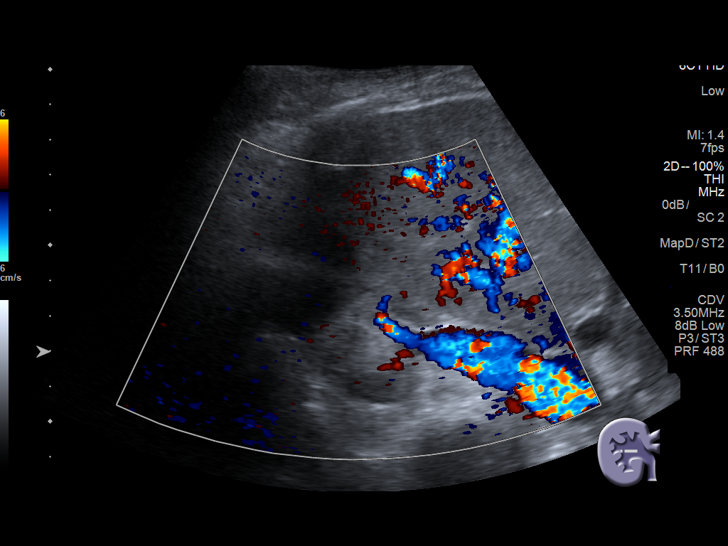
[im 23/49]
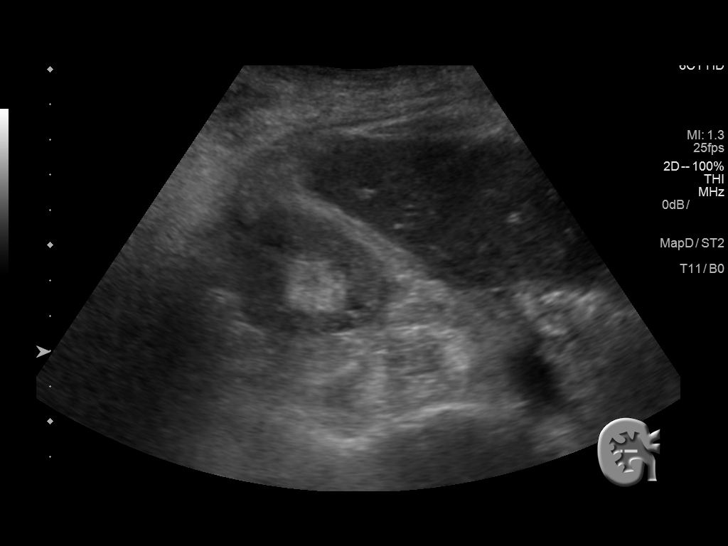
[im 27/49]
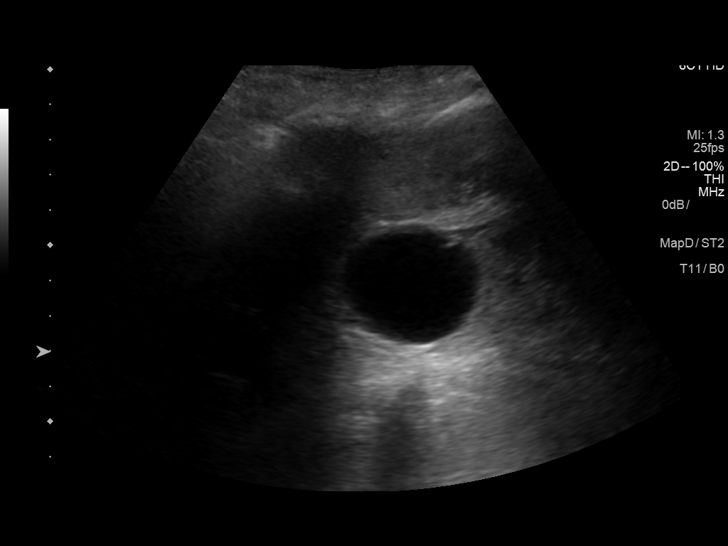
[im 31/49]
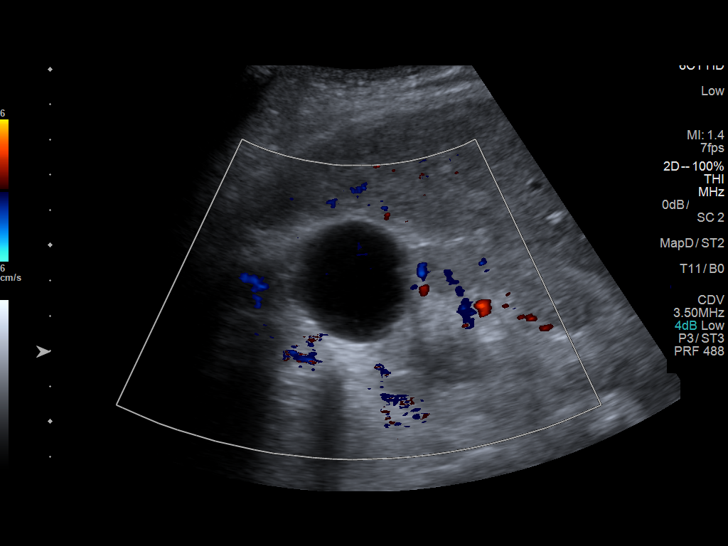
[im 33/49]
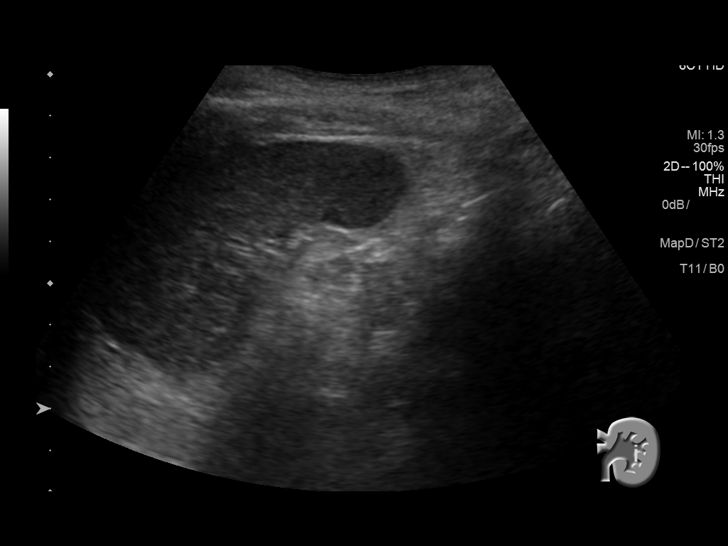
[im 37/49]
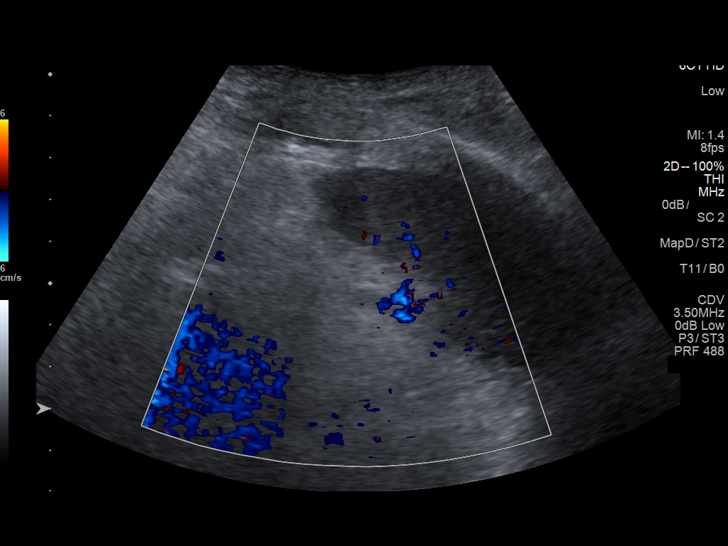
[im 41/49]
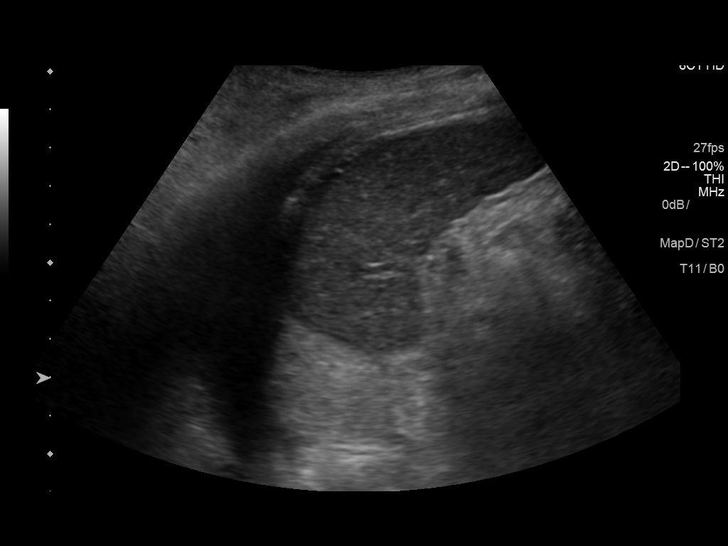
[im 45/49]
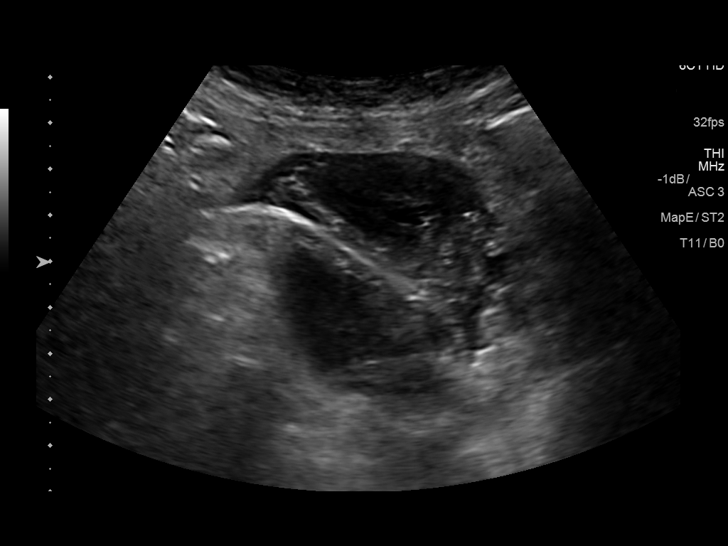
[im 49/49]
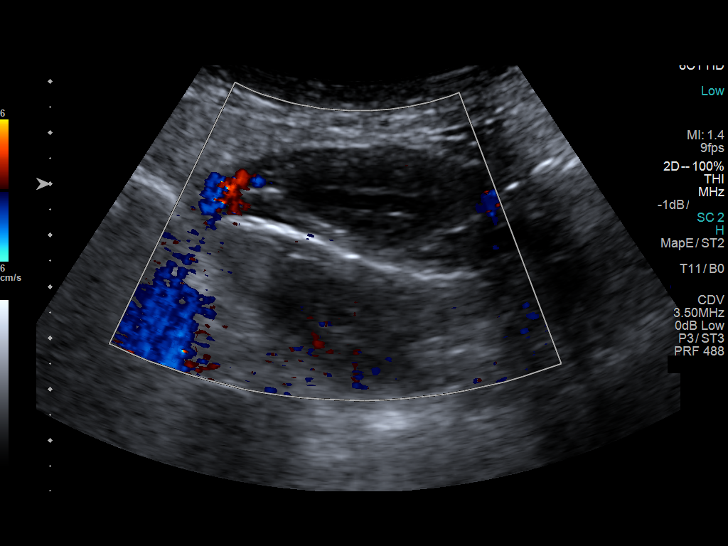

[14 of 25 positions shown; findings below may reference images not displayed]

FINDINGS: Right Kidney:

Length: 10.6 cm. 3.6 x 3.3 x 3.6 cm upper pole simple cyst. No
hydronephrosis.

Left Kidney:

Not discretely visualized in this patient with known left renal
atrophy.

Bladder:

Decompressed by indwelling Foley catheter.

Additional comments: Small left pleural effusion.
IMPRESSION: 3.6 cm right upper pole renal cyst, simple.  No hydronephrosis.

Left kidney not discretely visualized in this patient with known
left renal atrophy.

Bladder decompressed by indwelling Foley catheter.

## 2018-10-20 IMAGING — DX DG CHEST 2V
2 series · 2 of 2 positions shown · non-contrast
Comparison: 10/16/2017

CLINICAL DATA: Per ED notes: Patient released from hospital on
[REDACTED] , admit for CHF. Patient reports SOB and chest pain. No
chest pain currently

EXAM:
CHEST  2 VIEW

[chest lat]
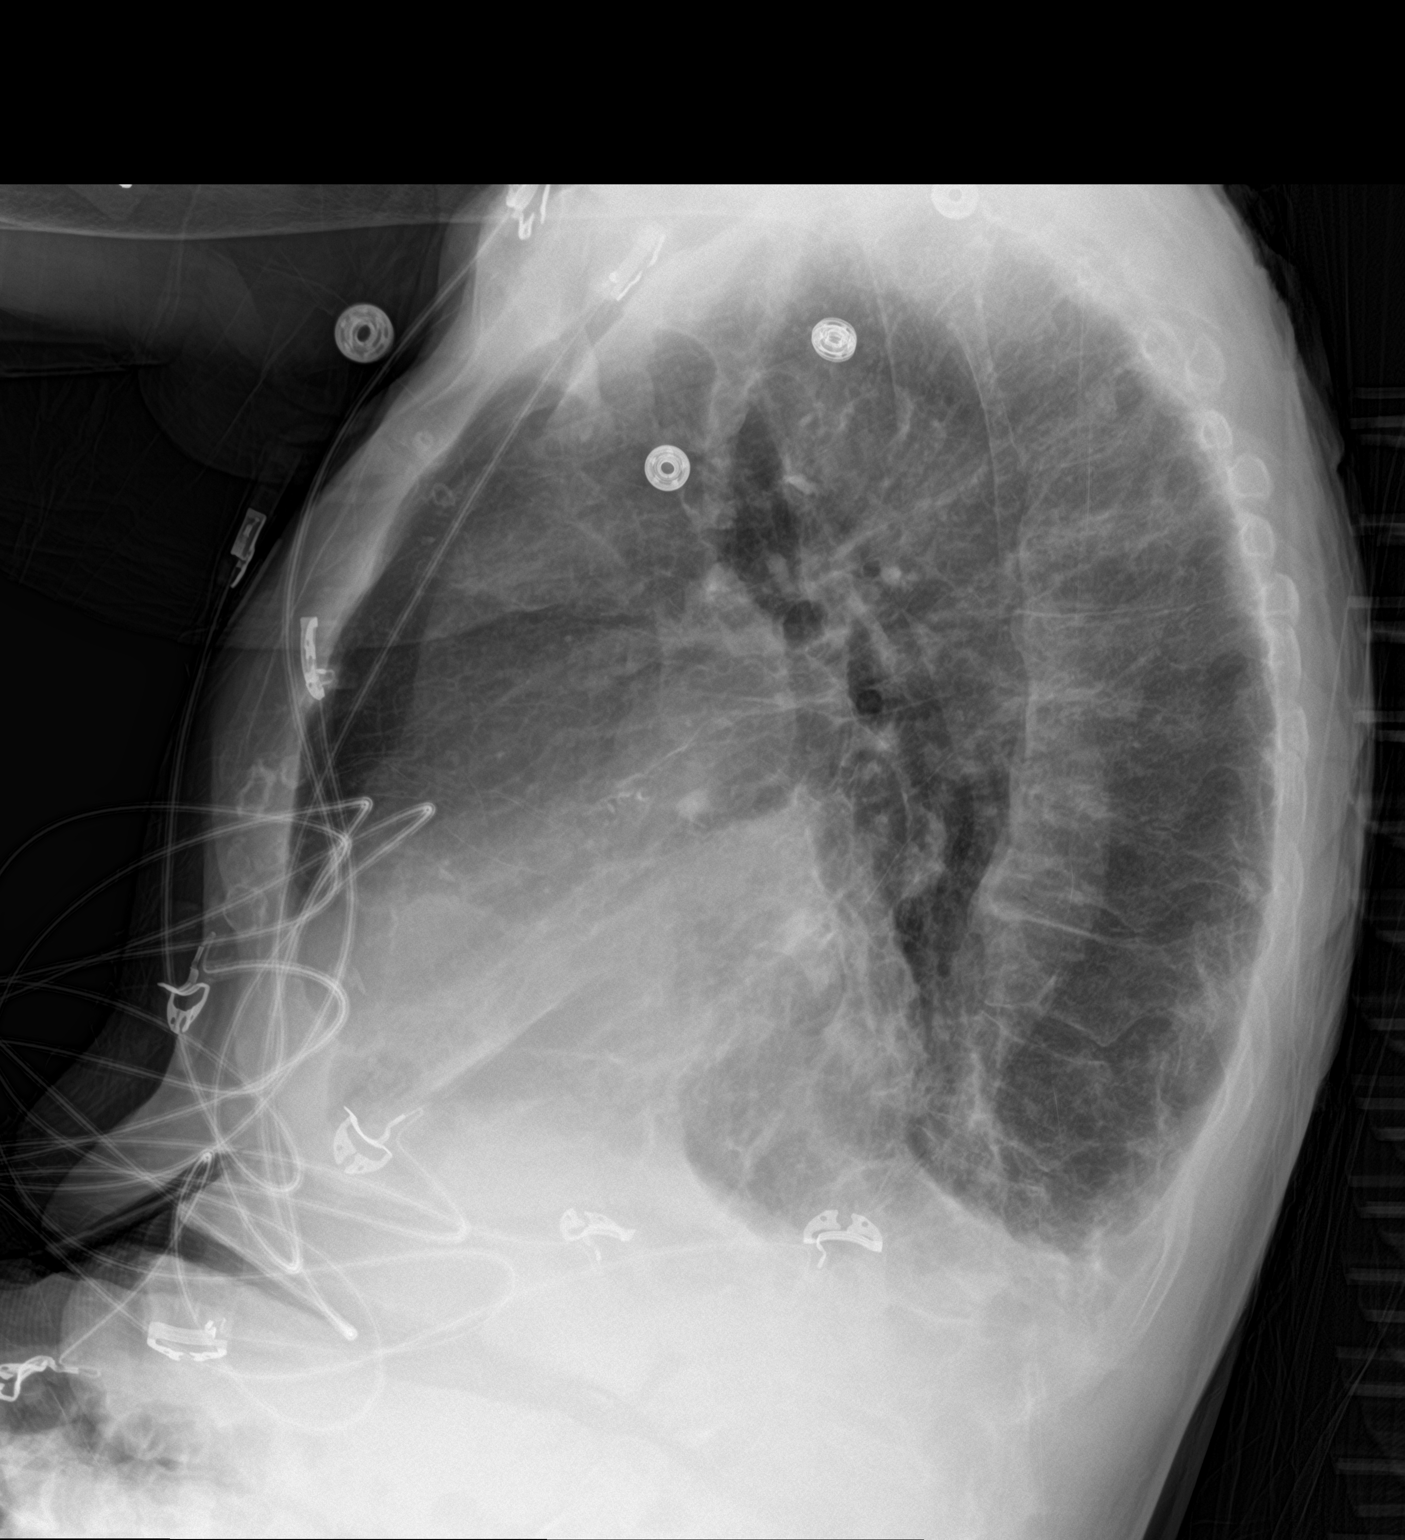

[chest ap]
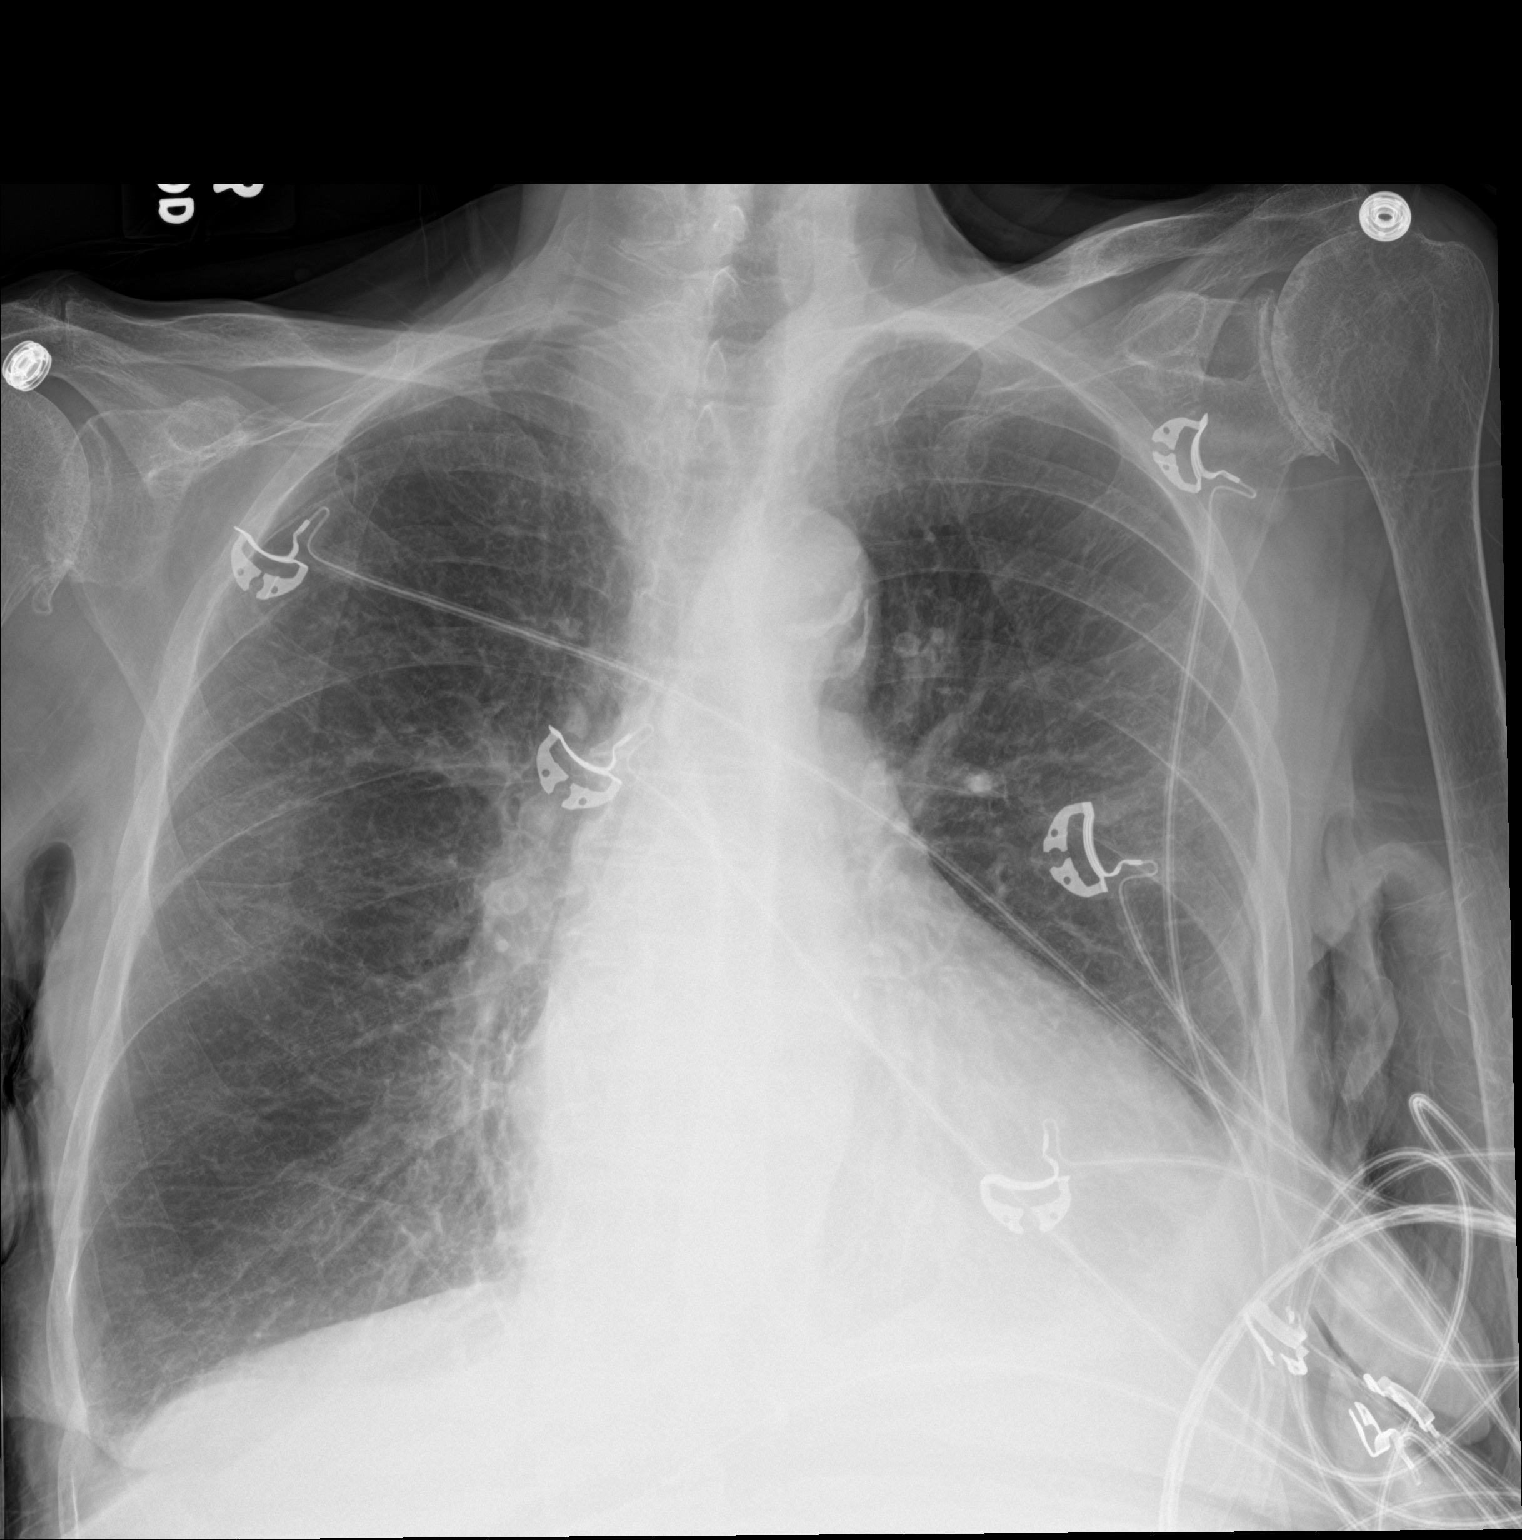

[2 of 2 positions shown; findings below may reference images not displayed]

FINDINGS: The heart is enlarged. There is mild interstitial pulmonary edema.
Bilateral pleural effusions, left greater than right. There has been
some improvement in the appearance of airspace filling opacities and
consolidation in the left lower lobe.
IMPRESSION: 1. Improved appearance of left lower lobe consolidation.
2. Cardiomegaly and mild edema persist.
3. Bilateral pleural effusions left greater than right.

## 2018-10-23 IMAGING — DX DG CHEST 2V
2 series · 2 of 2 positions shown · non-contrast
Comparison: October 29, 2017

CLINICAL DATA: Chest pain.  Shortness of breath.

EXAM:
CHEST  2 VIEW

[chest lat]
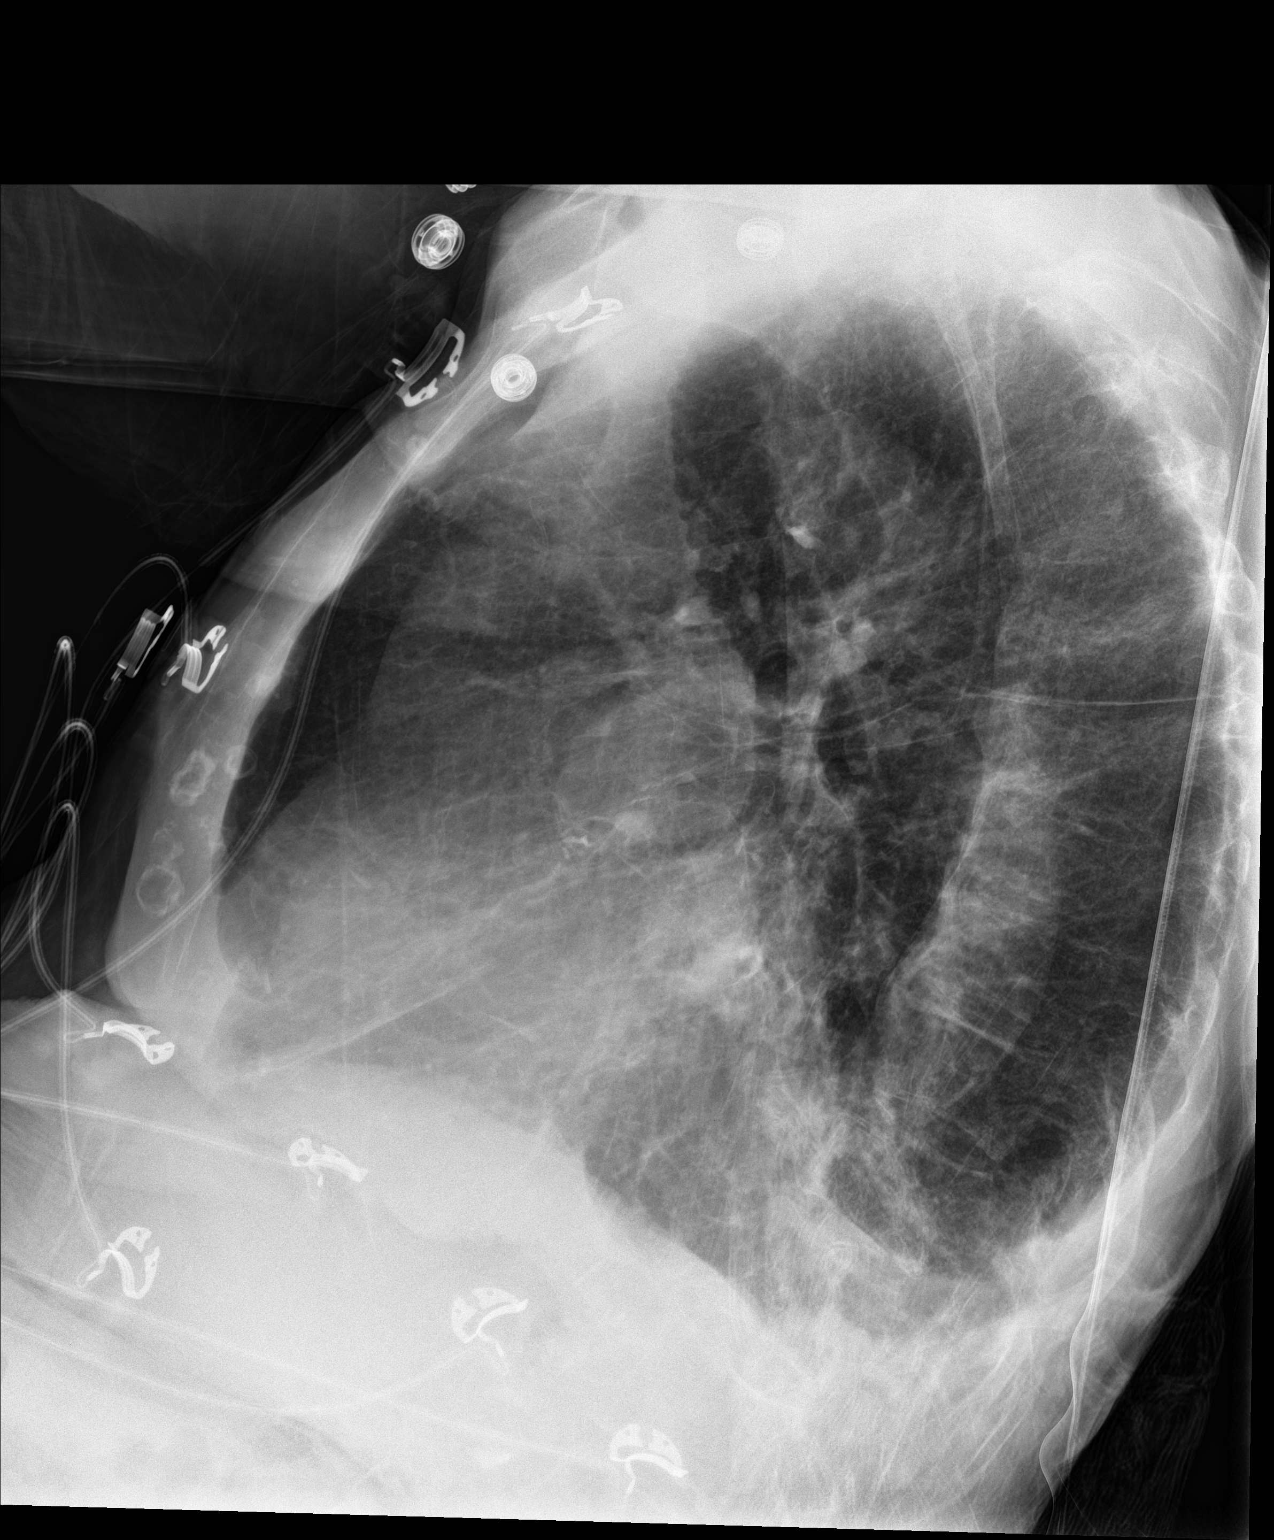

[chest ap]
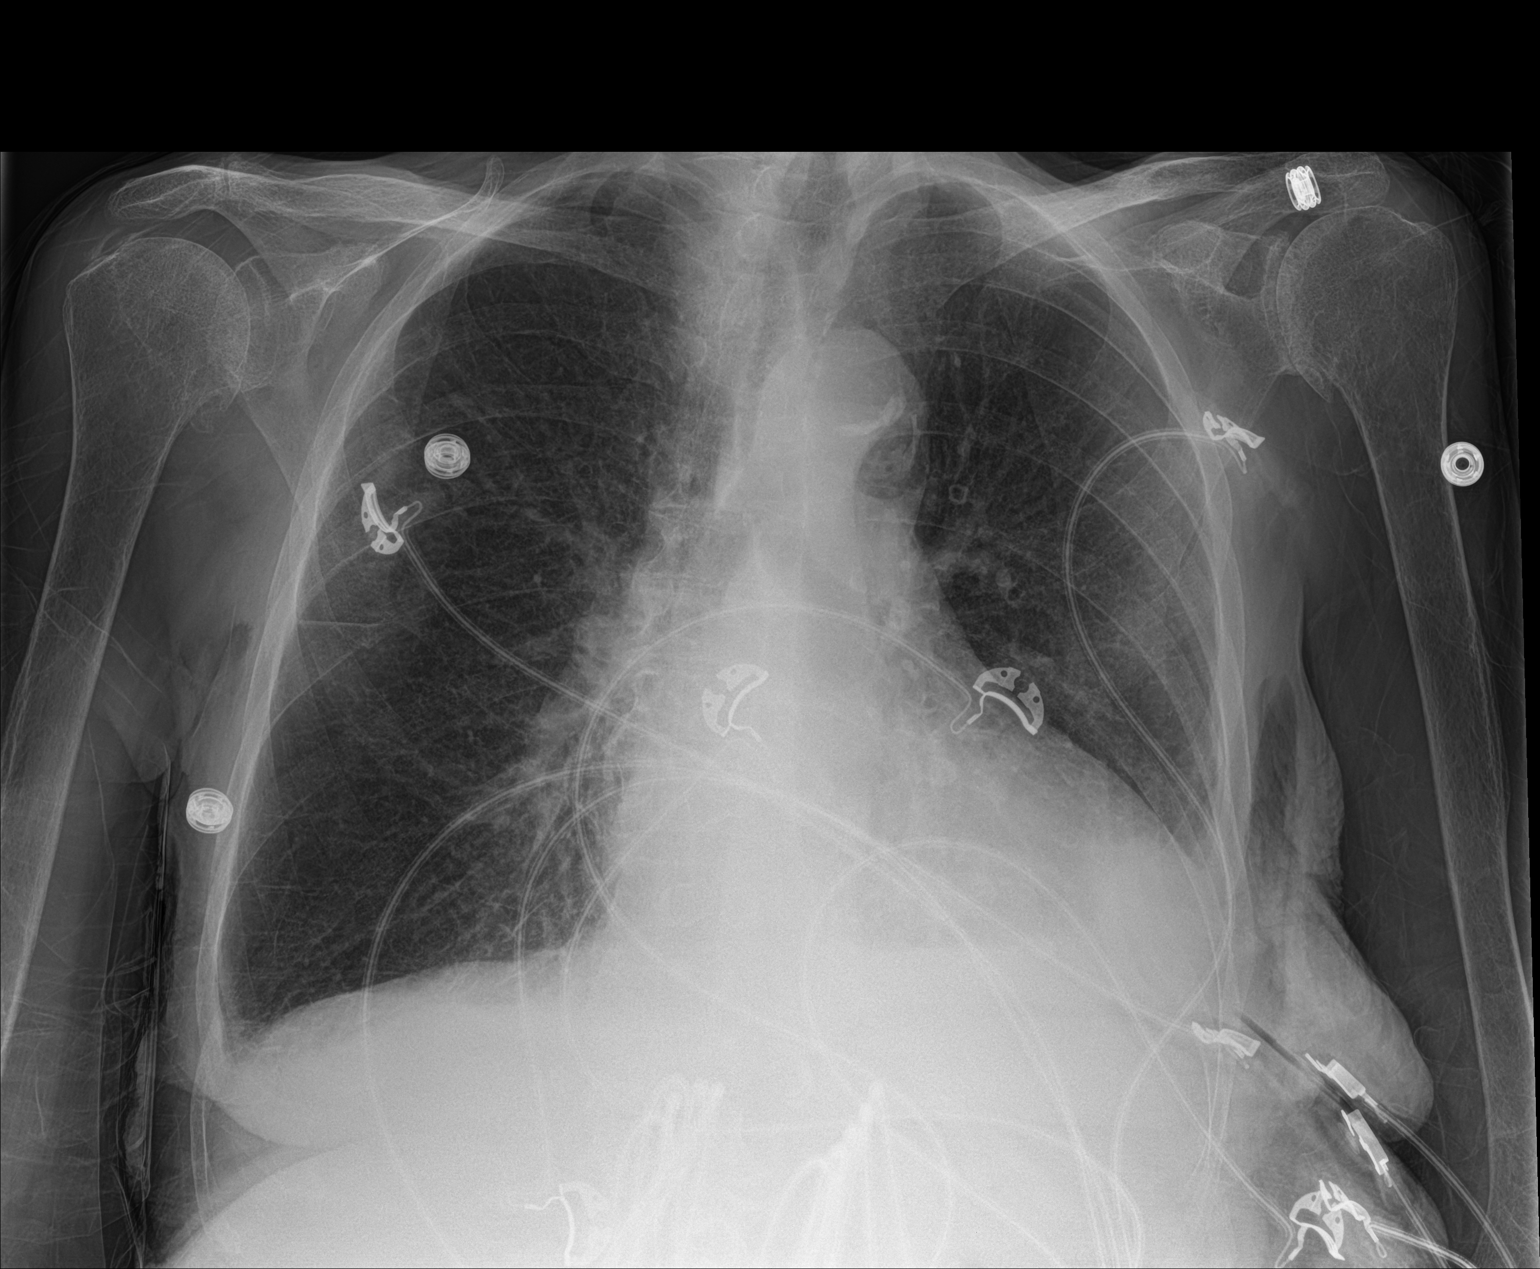

[2 of 2 positions shown; findings below may reference images not displayed]

FINDINGS: Small left effusion and associated infiltrate in the left base. No
other interval changes or acute abnormalities.
IMPRESSION: Small left effusion and associated infiltrate in the lateral left
lung base. Recommend follow-up to resolution.
